# Patient Record
Sex: Female | Born: 1970 | Race: Black or African American | Hispanic: No | Marital: Single | Smoking: Never smoker
Health system: Southern US, Community
[De-identification: ages and names within clinical notes are randomized; demographics above are authoritative.]

## PROBLEM LIST (undated history)

## (undated) DIAGNOSIS — Z8619 Personal history of other infectious and parasitic diseases: Secondary | ICD-10-CM

## (undated) DIAGNOSIS — R51 Headache: Secondary | ICD-10-CM

## (undated) DIAGNOSIS — K219 Gastro-esophageal reflux disease without esophagitis: Secondary | ICD-10-CM

## (undated) DIAGNOSIS — I10 Essential (primary) hypertension: Secondary | ICD-10-CM

## (undated) DIAGNOSIS — G43909 Migraine, unspecified, not intractable, without status migrainosus: Secondary | ICD-10-CM

## (undated) DIAGNOSIS — N6452 Nipple discharge: Secondary | ICD-10-CM

## (undated) DIAGNOSIS — R519 Headache, unspecified: Secondary | ICD-10-CM

## (undated) HISTORY — PX: NO PAST SURGERIES: SHX2092

## (undated) HISTORY — DX: Headache, unspecified: R51.9

## (undated) HISTORY — PX: CERVICAL SPINE SURGERY: SHX589

## (undated) HISTORY — DX: Migraine, unspecified, not intractable, without status migrainosus: G43.909

## (undated) HISTORY — DX: Personal history of other infectious and parasitic diseases: Z86.19

## (undated) HISTORY — DX: Headache: R51

---

## 2013-08-21 ENCOUNTER — Encounter (HOSPITAL_BASED_OUTPATIENT_CLINIC_OR_DEPARTMENT_OTHER): Payer: Self-pay | Admitting: Emergency Medicine

## 2013-08-21 ENCOUNTER — Emergency Department (HOSPITAL_BASED_OUTPATIENT_CLINIC_OR_DEPARTMENT_OTHER)
Admission: EM | Admit: 2013-08-21 | Discharge: 2013-08-22 | Disposition: A | Discharge status: Home / Self Care | Payer: Self-pay | Attending: Emergency Medicine | Admitting: Emergency Medicine

## 2013-08-21 ENCOUNTER — Emergency Department (HOSPITAL_BASED_OUTPATIENT_CLINIC_OR_DEPARTMENT_OTHER): Payer: Self-pay

## 2013-08-21 DIAGNOSIS — S6390XA Sprain of unspecified part of unspecified wrist and hand, initial encounter: Secondary | ICD-10-CM | POA: Insufficient documentation

## 2013-08-21 DIAGNOSIS — S63609A Unspecified sprain of unspecified thumb, initial encounter: Secondary | ICD-10-CM

## 2013-08-21 DIAGNOSIS — Y92410 Unspecified street and highway as the place of occurrence of the external cause: Secondary | ICD-10-CM | POA: Insufficient documentation

## 2013-08-21 DIAGNOSIS — Y9389 Activity, other specified: Secondary | ICD-10-CM | POA: Insufficient documentation

## 2013-08-21 MED ORDER — NAPROXEN 250 MG PO TABS
500.0000 mg | ORAL_TABLET | Freq: Once | ORAL | Status: AC
  Administered 2013-08-21: 500 mg via ORAL
  Filled 2013-08-21: qty 2
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
## 2013-08-21 NOTE — ED Notes (Signed)
MD at bedside. 
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
## 2013-08-21 NOTE — ED Notes (Signed)
Patient transported to X-ray ambulatory with tech. 

## 2013-08-22 MED ORDER — HYDROCODONE-ACETAMINOPHEN 5-325 MG PO TABS: 1.0000 | ORAL_TABLET | Freq: Four times a day (QID) | ORAL | Status: DC | PRN

## 2013-08-22 NOTE — ED Notes (Signed)
MD at bedside to discuss results of testing. 

## 2013-12-14 ENCOUNTER — Emergency Department (HOSPITAL_BASED_OUTPATIENT_CLINIC_OR_DEPARTMENT_OTHER)
Admission: EM | Admit: 2013-12-14 | Discharge: 2013-12-14 | Disposition: A | Discharge status: Home / Self Care | Payer: No Typology Code available for payment source | Attending: Emergency Medicine | Admitting: Emergency Medicine

## 2013-12-14 ENCOUNTER — Encounter (HOSPITAL_BASED_OUTPATIENT_CLINIC_OR_DEPARTMENT_OTHER): Payer: Self-pay | Admitting: Emergency Medicine

## 2013-12-14 ENCOUNTER — Emergency Department (HOSPITAL_BASED_OUTPATIENT_CLINIC_OR_DEPARTMENT_OTHER): Payer: No Typology Code available for payment source

## 2013-12-14 DIAGNOSIS — R110 Nausea: Secondary | ICD-10-CM | POA: Insufficient documentation

## 2013-12-14 DIAGNOSIS — R519 Headache, unspecified: Secondary | ICD-10-CM

## 2013-12-14 DIAGNOSIS — R209 Unspecified disturbances of skin sensation: Secondary | ICD-10-CM | POA: Insufficient documentation

## 2013-12-14 DIAGNOSIS — R51 Headache: Secondary | ICD-10-CM | POA: Insufficient documentation

## 2013-12-14 DIAGNOSIS — Z3202 Encounter for pregnancy test, result negative: Secondary | ICD-10-CM | POA: Insufficient documentation

## 2013-12-14 DIAGNOSIS — R202 Paresthesia of skin: Secondary | ICD-10-CM

## 2013-12-14 DIAGNOSIS — R531 Weakness: Secondary | ICD-10-CM

## 2013-12-14 DIAGNOSIS — M6281 Muscle weakness (generalized): Secondary | ICD-10-CM | POA: Insufficient documentation

## 2013-12-14 LAB — CBC WITH DIFFERENTIAL/PLATELET
Basophils Absolute: 0 10*3/uL (ref 0.0–0.1)
Basophils Relative: 0 % (ref 0–1)
Eosinophils Absolute: 0.1 10*3/uL (ref 0.0–0.7)
Eosinophils Relative: 1 % (ref 0–5)
HEMATOCRIT: 40.5 % (ref 36.0–46.0)
Hemoglobin: 13.6 g/dL (ref 12.0–15.0)
LYMPHS PCT: 24 % (ref 12–46)
Lymphs Abs: 2.4 10*3/uL (ref 0.7–4.0)
MCH: 32.7 pg (ref 26.0–34.0)
MCHC: 33.6 g/dL (ref 30.0–36.0)
MCV: 97.4 fL (ref 78.0–100.0)
MONO ABS: 0.7 10*3/uL (ref 0.1–1.0)
Monocytes Relative: 7 % (ref 3–12)
Neutro Abs: 6.6 10*3/uL (ref 1.7–7.7)
Neutrophils Relative %: 68 % (ref 43–77)
Platelets: 307 10*3/uL (ref 150–400)
RBC: 4.16 MIL/uL (ref 3.87–5.11)
RDW: 12.2 % (ref 11.5–15.5)
WBC: 9.8 10*3/uL (ref 4.0–10.5)

## 2013-12-14 LAB — BASIC METABOLIC PANEL
BUN: 12 mg/dL (ref 6–23)
CO2: 26 meq/L (ref 19–32)
Calcium: 9.5 mg/dL (ref 8.4–10.5)
Chloride: 101 mEq/L (ref 96–112)
Creatinine, Ser: 0.9 mg/dL (ref 0.50–1.10)
GFR calc Af Amer: >90 mL/min (ref >90)
GFR calc non Af Amer: 78 mL/min — ABNORMAL LOW (ref >90)
GLUCOSE: 103 mg/dL — AB (ref 70–99)
Potassium: 3.9 mEq/L (ref 3.7–5.3)
Sodium: 139 mEq/L (ref 137–147)

## 2013-12-14 LAB — PREGNANCY, URINE: Preg Test, Ur: NEGATIVE

## 2013-12-14 MED ORDER — SODIUM CHLORIDE 0.9 % IV BOLUS (SEPSIS)
1000.0000 mL | Freq: Once | INTRAVENOUS | Status: AC
  Administered 2013-12-14: 1000 mL via INTRAVENOUS

## 2013-12-14 MED ORDER — IBUPROFEN 400 MG PO TABS
600.0000 mg | ORAL_TABLET | Freq: Once | ORAL | Status: AC
  Administered 2013-12-14: 600 mg via ORAL
  Filled 2013-12-14 (×2): qty 1

## 2013-12-14 NOTE — ED Provider Notes (Signed)
CSN: [id number]     Arrival date & time 12/14/13  1814 History  This chart was scribed for Shanna CiscoMegan E Docherty, MD by Beverly MilchJ Harrison Collins, ED Scribe. This patient was seen in room MH09/MH09 and the patient's care was started at 8:54 PM.     Chief Complaint  Patient presents with  . Numbness      Patient is a 43 y.o. female presenting with weakness. The history is provided by the patient. No language interpreter was used.  Weakness This is a recurrent problem. The current episode started more than 1 week ago (About a month ago). The problem occurs daily. The problem has been gradually worsening. Associated symptoms include headaches. Pertinent negatives include no chest pain, no abdominal pain and no shortness of breath. Associated symptoms comments: pt states she been having episodic numbness on the outside of her left arm and left leg lasting 2-3 hours. She also reports nausea, dizziness, and sweating.. She has tried nothing for the symptoms. The treatment provided no relief.  Pt states her most recent episode of numbness lasted 2-3 hours and ended this afternoon around 3 PM and her most recent headache began this afternoon around 4 PM. She states she currently has no numbness. She states the h/a's and numbness are not related and occur independently.     History reviewed. No pertinent past medical history. History reviewed. No pertinent past surgical history. No family history on file. History  Substance Use Topics  . Smoking status: Never Smoker   . Smokeless tobacco: Not on file  . Alcohol Use: No    OB History   Grav Para Term Preterm Abortions TAB SAB Ect Mult Living                  Review of Systems  Constitutional: Negative for fever, chills, diaphoresis, activity change, appetite change and fatigue.  HENT: Negative for congestion, facial swelling, rhinorrhea and sore throat.   Eyes: Negative for photophobia and discharge.  Respiratory: Negative for cough, chest tightness  and shortness of breath.   Cardiovascular: Negative for chest pain, palpitations and leg swelling.  Gastrointestinal: Positive for nausea. Negative for vomiting, abdominal pain and diarrhea.  Endocrine: Negative for polydipsia and polyuria.  Genitourinary: Negative for dysuria, frequency, difficulty urinating and pelvic pain.  Musculoskeletal: Negative for arthralgias, back pain, neck pain and neck stiffness.  Skin: Negative for color change and wound.  Allergic/Immunologic: Negative for immunocompromised state.  Neurological: Positive for dizziness, weakness and headaches. Negative for facial asymmetry and numbness.  Hematological: Does not bruise/bleed easily.  Psychiatric/Behavioral: Negative for confusion and agitation.      Allergies  Review of patient's allergies indicates no known allergies.  Home Medications   Current Outpatient Rx  Name  Route  Sig  Dispense  Refill  . HYDROcodone-acetaminophen (NORCO) 5-325 MG per tablet   Oral   Take 1 tablet by mouth every 6 (six) hours as needed.   10 tablet   0    Triage Vitals: BP 156/90  Pulse 71  Temp(Src) 98.4 F (36.9 C) (Oral)  Resp 18  Ht 5\' 7"  (1.702 m)  Wt 220 lb (99.791 kg)  BMI 34.45 kg/m2  SpO2 100%  LMP 12/10/2013  Physical Exam  Nursing note and vitals reviewed. Constitutional: She is oriented to person, place, and time. She appears well-developed and well-nourished. No distress.  HENT:  Head: Normocephalic.  Mouth/Throat: Oropharynx is clear and moist.  Eyes: Conjunctivae and EOM are normal. Pupils are equal, round,  and reactive to light.  Neck: Neck supple.  Cardiovascular: Normal rate, regular rhythm and normal heart sounds.  Exam reveals no gallop and no friction rub.   No murmur heard. Pulmonary/Chest: Effort normal and breath sounds normal. No respiratory distress. She has no wheezes. She has no rales.  Abdominal: Soft. Bowel sounds are normal. She exhibits no distension. There is no tenderness.  There is no rebound and no guarding.  Musculoskeletal: She exhibits no edema and no tenderness.  Neurological: She is alert and oriented to person, place, and time. She has normal strength. She displays no atrophy and no tremor. No cranial nerve deficit or sensory deficit. She exhibits normal muscle tone. She displays a negative Romberg sign. She displays no seizure activity. Coordination and gait normal. GCS eye subscore is 4. GCS verbal subscore is 5. GCS motor subscore is 6.  Skin: Skin is warm and dry.  Psychiatric: She has a normal mood and affect. Her behavior is normal.    ED Course  Procedures (including critical care time)  DIAGNOSTIC STUDIES: Oxygen Saturation is 100% on RA, normal by my interpretation.    COORDINATION OF CARE: 9:04 PM- Pt advised of plan for treatment and pt agrees.    Labs Review Labs Reviewed  BASIC METABOLIC PANEL - Abnormal; Notable for the following:    Glucose, Bld 103 (*)    GFR calc non Af Amer 78 (*)    All other components within normal limits  CBC WITH DIFFERENTIAL  PREGNANCY, URINE   Imaging Review Ct Head Wo Contrast  12/14/2013   CLINICAL DATA:  Headache.  Left side numbness and weakness.  EXAM: CT HEAD WITHOUT CONTRAST  TECHNIQUE: Contiguous axial images were obtained from the base of the skull through the vertex without intravenous contrast.  COMPARISON:  None.  FINDINGS: The brain appears normal without infarct, hemorrhage, mass lesion, mass effect, midline shift or abnormal extra-axial fluid collection no hydrocephalus or pneumocephalus. The calvarium is intact.  IMPRESSION: Negative head CT.   Electronically Signed   By: Drusilla Kanner M.D.   On: 12/14/2013 22:16     EKG Interpretation None      MDM   Final diagnoses:  Paresthesias  Weakness  Headache    Pt is a 43 y.o. female with Pmhx as above who presents with about 4 weeks of intermittent L lateral arm numbness and L lateral leg numbness that ws occuring about every  week or so, now every few days, lasting 2-3 hours at a time. No other assoc symptoms, though does report intermittent frontal h/a's that occur independently from numbness and has no assoc symptoms. On PE, VSS, pt in NAD.  No focal neuro findings on PE.  CBC, BMP grossly unremarkable. CT head unremarkable. Doubt CVA/TIA, mass lesion.  I feel she is safe for continued outpt f/u, will refer to Neurology.  Return precautions given for new or worsening symptoms including worsening numbness, weakness, fevers.    I personally performed the services described in this documentation, which was scribed in my presence. The recorded information has been reviewed and is accurate.      Shanna Cisco, MD 12/16/13 [phone]

## 2013-12-14 NOTE — ED Notes (Signed)
Numbness in her left leg and left arm x 2 days. Dizziness last night. She was sent home from work. Ambulatory to triage. Headache today.

## 2013-12-14 NOTE — Discharge Instructions (Signed)
Headaches, Frequently Asked Questions °MIGRAINE HEADACHES °Q: What is migraine? What causes it? How can I treat it? °A: Generally, migraine headaches begin as a dull ache. Then they develop into a constant, throbbing, and pulsating pain. You may experience pain at the temples. You may experience pain at the front or back of one or both sides of the head. The pain is usually accompanied by a combination of: °· Nausea. °· Vomiting. °· Sensitivity to light and noise. °Some people (about 15%) experience an aura (see below) before an attack. The cause of migraine is believed to be chemical reactions in the brain. Treatment for migraine may include over-the-counter or prescription medications. It may also include self-help techniques. These include relaxation training and biofeedback.  °Q: What is an aura? °A: About 15% of people with migraine get an "aura". This is a sign of neurological symptoms that occur before a migraine headache. You may see wavy or jagged lines, dots, or flashing lights. You might experience tunnel vision or blind spots in one or both eyes. The aura can include visual or auditory hallucinations (something imagined). It may include disruptions in smell (such as strange odors), taste or touch. Other symptoms include: °· Numbness. °· A "pins and needles" sensation. °· Difficulty in recalling or speaking the correct word. °These neurological events may last as long as 60 minutes. These symptoms will fade as the headache begins. °Q: What is a trigger? °A: Certain physical or environmental factors can lead to or "trigger" a migraine. These include: °· Foods. °· Hormonal changes. °· Weather. °· Stress. °It is important to remember that triggers are different for everyone. To help prevent migraine attacks, you need to figure out which triggers affect you. Keep a headache diary. This is a good way to track triggers. The diary will help you talk to your healthcare professional about your condition. °Q: Does  weather affect migraines? °A: Bright sunshine, hot, humid conditions, and drastic changes in barometric pressure may lead to, or "trigger," a migraine attack in some people. But studies have shown that weather does not act as a trigger for everyone with migraines. °Q: What is the link between migraine and hormones? °A: Hormones start and regulate many of your body's functions. Hormones keep your body in balance within a constantly changing environment. The levels of hormones in your body are unbalanced at times. Examples are during menstruation, pregnancy, or menopause. That can lead to a migraine attack. In fact, about three quarters of all women with migraine report that their attacks are related to the menstrual cycle.  °Q: Is there an increased risk of stroke for migraine sufferers? °A: The likelihood of a migraine attack causing a stroke is very remote. That is not to say that migraine sufferers cannot have a stroke associated with their migraines. In persons under age 40, the most common associated factor for stroke is migraine headache. But over the course of a person's normal life span, the occurrence of migraine headache may actually be associated with a reduced risk of dying from cerebrovascular disease due to stroke.  °Q: What are acute medications for migraine? °A: Acute medications are used to treat the pain of the headache after it has started. Examples over-the-counter medications, NSAIDs, ergots, and triptans.  °Q: What are the triptans? °A: Triptans are the newest class of abortive medications. They are specifically targeted to treat migraine. Triptans are vasoconstrictors. They moderate some chemical reactions in the brain. The triptans work on receptors in your brain. Triptans help   to restore the balance of a neurotransmitter called serotonin. Fluctuations in levels of serotonin are thought to be a main cause of migraine.  °Q: Are over-the-counter medications for migraine effective? °A:  Over-the-counter, or "OTC," medications may be effective in relieving mild to moderate pain and associated symptoms of migraine. But you should see your caregiver before beginning any treatment regimen for migraine.  °Q: What are preventive medications for migraine? °A: Preventive medications for migraine are sometimes referred to as "prophylactic" treatments. They are used to reduce the frequency, severity, and length of migraine attacks. Examples of preventive medications include antiepileptic medications, antidepressants, beta-blockers, calcium channel blockers, and NSAIDs (nonsteroidal anti-inflammatory drugs). °Q: Why are anticonvulsants used to treat migraine? °A: During the past few years, there has been an increased interest in antiepileptic drugs for the prevention of migraine. They are sometimes referred to as "anticonvulsants". Both epilepsy and migraine may be caused by similar reactions in the brain.  °Q: Why are antidepressants used to treat migraine? °A: Antidepressants are typically used to treat people with depression. They may reduce migraine frequency by regulating chemical levels, such as serotonin, in the brain.  °Q: What alternative therapies are used to treat migraine? °A: The term "alternative therapies" is often used to describe treatments considered outside the scope of conventional Western medicine. Examples of alternative therapy include acupuncture, acupressure, and yoga. Another common alternative treatment is herbal therapy. Some herbs are believed to relieve headache pain. Always discuss alternative therapies with your caregiver before proceeding. Some herbal products contain arsenic and other toxins. °TENSION HEADACHES °Q: What is a tension-type headache? What causes it? How can I treat it? °A: Tension-type headaches occur randomly. They are often the result of temporary stress, anxiety, fatigue, or anger. Symptoms include soreness in your temples, a tightening band-like sensation  around your head (a "vice-like" ache). Symptoms can also include a pulling feeling, pressure sensations, and contracting head and neck muscles. The headache begins in your forehead, temples, or the back of your head and neck. Treatment for tension-type headache may include over-the-counter or prescription medications. Treatment may also include self-help techniques such as relaxation training and biofeedback. °CLUSTER HEADACHES °Q: What is a cluster headache? What causes it? How can I treat it? °A: Cluster headache gets its name because the attacks come in groups. The pain arrives with little, if any, warning. It is usually on one side of the head. A tearing or bloodshot eye and a runny nose on the same side of the headache may also accompany the pain. Cluster headaches are believed to be caused by chemical reactions in the brain. They have been described as the most severe and intense of any headache type. Treatment for cluster headache includes prescription medication and oxygen. °SINUS HEADACHES °Q: What is a sinus headache? What causes it? How can I treat it? °A: When a cavity in the bones of the face and skull (a sinus) becomes inflamed, the inflammation will cause localized pain. This condition is usually the result of an allergic reaction, a tumor, or an infection. If your headache is caused by a sinus blockage, such as an infection, you will probably have a fever. An x-ray will confirm a sinus blockage. Your caregiver's treatment might include antibiotics for the infection, as well as antihistamines or decongestants.  °REBOUND HEADACHES °Q: What is a rebound headache? What causes it? How can I treat it? °A: A pattern of taking acute headache medications too often can lead to a condition known as "rebound headache."   A pattern of taking too much headache medication includes taking it more than 2 days per week or in excessive amounts. That means more than the label or a caregiver advises. With rebound  headaches, your medications not only stop relieving pain, they actually begin to cause headaches. Doctors treat rebound headache by tapering the medication that is being overused. Sometimes your caregiver will gradually substitute a different type of treatment or medication. Stopping may be a challenge. Regularly overusing a medication increases the potential for serious side effects. Consult a caregiver if you regularly use headache medications more than 2 days per week or more than the label advises. °ADDITIONAL QUESTIONS AND ANSWERS °Q: What is biofeedback? °A: Biofeedback is a self-help treatment. Biofeedback uses special equipment to monitor your body's involuntary physical responses. Biofeedback monitors: °· Breathing. °· Pulse. °· Heart rate. °· Temperature. °· Muscle tension. °· Brain activity. °Biofeedback helps you refine and perfect your relaxation exercises. You learn to control the physical responses that are related to stress. Once the technique has been mastered, you do not need the equipment any more. °Q: Are headaches hereditary? °A: Four out of five (80%) of people that suffer report a family history of migraine. Scientists are not sure if this is genetic or a family predisposition. Despite the uncertainty, a child has a 50% chance of having migraine if one parent suffers. The child has a 75% chance if both parents suffer.  °Q: Can children get headaches? °A: By the time they reach high school, most young people have experienced some type of headache. Many safe and effective approaches or medications can prevent a headache from occurring or stop it after it has begun.  °Q: What type of doctor should I see to diagnose and treat my headache? °A: Start with your primary caregiver. Discuss his or her experience and approach to headaches. Discuss methods of classification, diagnosis, and treatment. Your caregiver may decide to recommend you to a headache specialist, depending upon your symptoms or other  physical conditions. Having diabetes, allergies, etc., may require a more comprehensive and inclusive approach to your headache. The [employer] will provide, upon request, a list of NHF physician members in your state. °Document Released: 12/08/2003 Document Revised: 12/10/2011 Document Reviewed: 05/17/2008 °Exit[employer]® Patient Information ©2014 [employer]. ° °Paresthesia °Paresthesia is an abnormal burning or prickling sensation. This sensation is generally felt in the hands, arms, legs, or feet. However, it may occur in any part of the body. It is usually not painful. The feeling may be described as: °· Tingling or numbness. °· "Pins and needles." °· Skin crawling. °· Buzzing. °· Limbs "falling asleep." °· Itching. °Most people experience temporary (transient) paresthesia at some time in their lives. °CAUSES  °Paresthesia may occur when you breathe too quickly (hyperventilation). It can also occur without any apparent cause. Commonly, paresthesia occurs when pressure is placed on a nerve. The feeling quickly goes away once the pressure is removed. For some people, however, paresthesia is a long-lasting (chronic) condition caused by an underlying disorder. The underlying disorder may be: °· A traumatic, direct injury to nerves. Examples include a: °· Broken (fractured) neck. °· Fractured skull. °· A disorder affecting the brain and spinal cord (central nervous system). Examples include: °· Transverse myelitis. °· Encephalitis. °· Transient ischemic attack. °· Multiple sclerosis. °· Stroke. °· Tumor or blood vessel problems, such as an arteriovenous malformation pressing against the brain or spinal cord. °· A condition that damages the peripheral nerves (peripheral neuropathy). Peripheral nerves   not part of the brain and spinal cord. These conditions include:  Diabetes.  Peripheral vascular disease.  Nerve entrapment syndromes, such as carpal tunnel  syndrome.  Shingles.  Hypothyroidism.  Vitamin B12 deficiencies.  Alcoholism.  Heavy metal poisoning (lead, arsenic).  Rheumatoid arthritis.  Systemic lupus erythematosus. DIAGNOSIS  Your caregiver will attempt to find the underlying cause of your paresthesia. Your caregiver may:  Take your medical history.  Perform a physical exam.  Order various lab tests.  Order imaging tests. TREATMENT  Treatment for paresthesia depends on the underlying cause. HOME CARE INSTRUCTIONS  Avoid drinking alcohol.  You may consider massage or acupuncture to help relieve your symptoms.  Keep all follow-up appointments as directed by your caregiver. SEEK IMMEDIATE MEDICAL CARE IF:   You feel weak.  You have trouble walking or moving.  You have problems with speech or vision.  You feel confused.  You cannot control your bladder or bowel movements.  You feel numbness after an injury.  You faint.  Your burning or prickling feeling gets worse when walking.  You have pain, cramps, or dizziness.  You develop a rash. MAKE SURE YOU:  Understand these instructions.  Will watch your condition.  Will get help right away if you are not doing well or get worse. Document Released: 09/07/2002 Document Revised: 12/10/2011 Document Reviewed: 06/08/2011 [facility] Patient Information 2014 KennedyExitCare, MarylandLL[employer].

## 2013-12-14 NOTE — ED Notes (Signed)
Numbness in left arm and leg x 2 days  w vomiting last pm,  Headache x 4.5 hours, no n/v since last pm,  Ambulatory without diff,  No facial droop,  No arm drift,  Grips equal and strong bilateral

## 2014-05-10 ENCOUNTER — Emergency Department (HOSPITAL_BASED_OUTPATIENT_CLINIC_OR_DEPARTMENT_OTHER)
Admission: EM | Admit: 2014-05-10 | Discharge: 2014-05-10 | Disposition: A | Discharge status: Home / Self Care | Payer: BC Managed Care – PPO | Attending: Emergency Medicine | Admitting: Emergency Medicine

## 2014-05-10 ENCOUNTER — Encounter (HOSPITAL_BASED_OUTPATIENT_CLINIC_OR_DEPARTMENT_OTHER): Payer: Self-pay | Admitting: Emergency Medicine

## 2014-05-10 DIAGNOSIS — R112 Nausea with vomiting, unspecified: Secondary | ICD-10-CM | POA: Insufficient documentation

## 2014-05-10 DIAGNOSIS — Z8719 Personal history of other diseases of the digestive system: Secondary | ICD-10-CM | POA: Diagnosis not present

## 2014-05-10 DIAGNOSIS — R1013 Epigastric pain: Secondary | ICD-10-CM | POA: Diagnosis not present

## 2014-05-10 DIAGNOSIS — R61 Generalized hyperhidrosis: Secondary | ICD-10-CM | POA: Insufficient documentation

## 2014-05-10 DIAGNOSIS — R6883 Chills (without fever): Secondary | ICD-10-CM | POA: Diagnosis not present

## 2014-05-10 DIAGNOSIS — R1110 Vomiting, unspecified: Secondary | ICD-10-CM | POA: Insufficient documentation

## 2014-05-10 DIAGNOSIS — R1033 Periumbilical pain: Secondary | ICD-10-CM | POA: Insufficient documentation

## 2014-05-10 HISTORY — DX: Gastro-esophageal reflux disease without esophagitis: K21.9

## 2014-05-10 LAB — URINE MICROSCOPIC-ADD ON

## 2014-05-10 LAB — CBC WITH DIFFERENTIAL/PLATELET
BASOS ABS: 0 10*3/uL (ref 0.0–0.1)
BASOS PCT: 0 % (ref 0–1)
EOS PCT: 1 % (ref 0–5)
Eosinophils Absolute: 0.1 10*3/uL (ref 0.0–0.7)
HCT: 39.1 % (ref 36.0–46.0)
Hemoglobin: 13.1 g/dL (ref 12.0–15.0)
LYMPHS PCT: 20 % (ref 12–46)
Lymphs Abs: 2.2 10*3/uL (ref 0.7–4.0)
MCH: 32.3 pg (ref 26.0–34.0)
MCHC: 33.5 g/dL (ref 30.0–36.0)
MCV: 96.5 fL (ref 78.0–100.0)
Monocytes Absolute: 1.1 10*3/uL — ABNORMAL HIGH (ref 0.1–1.0)
Monocytes Relative: 10 % (ref 3–12)
Neutro Abs: 7.4 10*3/uL (ref 1.7–7.7)
Neutrophils Relative %: 68 % (ref 43–77)
PLATELETS: 322 10*3/uL (ref 150–400)
RBC: 4.05 MIL/uL (ref 3.87–5.11)
RDW: 12.4 % (ref 11.5–15.5)
WBC: 10.9 10*3/uL — AB (ref 4.0–10.5)

## 2014-05-10 LAB — COMPREHENSIVE METABOLIC PANEL
ALBUMIN: 3.7 g/dL (ref 3.5–5.2)
ALT: 21 U/L (ref 0–35)
AST: 27 U/L (ref 0–37)
Alkaline Phosphatase: 89 U/L (ref 39–117)
Anion gap: 12 (ref 5–15)
BUN: 12 mg/dL (ref 6–23)
CALCIUM: 9.7 mg/dL (ref 8.4–10.5)
CO2: 25 meq/L (ref 19–32)
Chloride: 103 mEq/L (ref 96–112)
Creatinine, Ser: 0.9 mg/dL (ref 0.50–1.10)
GFR calc Af Amer: >90 mL/min (ref >90)
GFR, EST NON AFRICAN AMERICAN: 78 mL/min — AB (ref >90)
Glucose, Bld: 93 mg/dL (ref 70–99)
Potassium: 4 mEq/L (ref 3.7–5.3)
SODIUM: 140 meq/L (ref 137–147)
Total Bilirubin: 0.5 mg/dL (ref 0.3–1.2)
Total Protein: 7.5 g/dL (ref 6.0–8.3)

## 2014-05-10 LAB — URINALYSIS, ROUTINE W REFLEX MICROSCOPIC
Glucose, UA: NEGATIVE mg/dL
KETONES UR: 15 mg/dL — AB
Leukocytes, UA: NEGATIVE
Nitrite: NEGATIVE
PH: 5 (ref 5.0–8.0)
Protein, ur: 30 mg/dL — AB
Specific Gravity, Urine: 1.037 — ABNORMAL HIGH (ref 1.005–1.030)
Urobilinogen, UA: 1 mg/dL (ref 0.0–1.0)

## 2014-05-10 LAB — LIPASE, BLOOD: Lipase: 22 U/L (ref 11–59)

## 2014-05-10 LAB — PREGNANCY, URINE: Preg Test, Ur: NEGATIVE

## 2014-05-10 MED ORDER — ONDANSETRON HCL 4 MG/2ML IJ SOLN
4.0000 mg | Freq: Once | INTRAMUSCULAR | Status: AC
  Administered 2014-05-10: 4 mg via INTRAVENOUS
  Filled 2014-05-10: qty 2

## 2014-05-10 MED ORDER — ONDANSETRON HCL 4 MG PO TABS: 4.0000 mg | ORAL_TABLET | Freq: Four times a day (QID) | ORAL | Status: DC

## 2014-05-10 MED ORDER — GI COCKTAIL ~~LOC~~
30.0000 mL | Freq: Once | ORAL | Status: AC
  Administered 2014-05-10: 30 mL via ORAL
  Filled 2014-05-10: qty 30

## 2014-05-10 MED ORDER — PANTOPRAZOLE SODIUM 20 MG PO TBEC
20.0000 mg | DELAYED_RELEASE_TABLET | Freq: Every day | ORAL | Status: DC
Start: 2014-05-10 — End: 2015-02-08

## 2014-05-10 NOTE — Discharge Instructions (Signed)

## 2014-05-10 NOTE — ED Notes (Signed)
Pt c/o n/v with diffuse abd pain x 1 day

## 2014-05-10 NOTE — ED Notes (Signed)
Placed on cardiac and pulse ox monitoring.

## 2014-05-10 NOTE — ED Provider Notes (Signed)
CSN: [id number]     Arrival date & time 05/10/14  1815 History  This chart was scribed for Kim Macdonald, * by Modena Jansky, ED Scribe. This patient was seen in room MH05/MH05 and the patient's care was started at 7:42 PM.   Chief Complaint  Patient presents with  . Emesis   Patient is a 43 y.o. female presenting with vomiting. The history is provided by the patient. No language interpreter was used.  Emesis Severity:  Moderate Duration:  14 hours Timing:  Intermittent Progression:  Unchanged Chronicity:  New Relieved by:  None tried Worsened by:  Nothing tried Ineffective treatments:  None tried Associated symptoms: abdominal pain and chills    HPI Comments: Kim Macdonald is a 43 y.o. female with a hx of reflux who presents to the Emergency Department complaining of moderate intermittent emesis that started yesterday evening. She reports that has associated right sided abdominal pain and nausea. She states that her nausea, not her abdominal pain, is exacerbated by eating. She states that she has diaphoresis and some chills when she vomits. She reports that she has normal BMs. She states that her LNMP was 5 days ago. She denies any fever, flank pain, urinary symptoms, vaginal bleeding, or vaginal discharge.    Past Medical History  Diagnosis Date  . GERD (gastroesophageal reflux disease)    History reviewed. No pertinent past surgical history. History reviewed. No pertinent family history. History  Substance Use Topics  . Smoking status: Never Smoker   . Smokeless tobacco: Not on file  . Alcohol Use: No   OB History   Grav Para Term Preterm Abortions TAB SAB Ect Mult Living                 Review of Systems  Constitutional: Positive for chills and diaphoresis. Negative for fever.  Gastrointestinal: Positive for nausea, vomiting and abdominal pain.  Genitourinary: Negative for dysuria, flank pain, vaginal bleeding and vaginal discharge.  All other systems reviewed and  are negative.   Allergies  Review of patient's allergies indicates no known allergies.  Home Medications   Prior to Admission medications   Not on File   BP 149/88  Pulse 84  Temp(Src) 98 F (36.7 C) (Oral)  Resp 16  Ht 5\' 7"  (1.702 m)  Wt 220 lb (99.791 kg)  BMI 34.45 kg/m2  SpO2 99%  LMP 05/05/2014 Physical Exam  Nursing note and vitals reviewed. Constitutional: She is oriented to person, place, and time. She appears well-developed and well-nourished. No distress.  HENT:  Head: Normocephalic and atraumatic.  Mouth/Throat: Oropharynx is clear and moist.  Eyes: Conjunctivae are normal. Pupils are equal, round, and reactive to light. No scleral icterus.  Neck: Neck supple.  Cardiovascular: Normal rate, regular rhythm, normal heart sounds and intact distal pulses.   No murmur heard. Pulmonary/Chest: Effort normal and breath sounds normal. No stridor. No respiratory distress. She has no rales.  Abdominal: Soft. Bowel sounds are normal. She exhibits no distension. There is tenderness in the epigastric area and periumbilical area. There is no rigidity, no rebound, no guarding and no CVA tenderness.  Musculoskeletal: Normal range of motion.  Neurological: She is alert and oriented to person, place, and time.  Skin: Skin is warm and dry. No rash noted.  Psychiatric: She has a normal mood and affect. Her behavior is normal.    ED Course  Procedures (including critical care time) DIAGNOSTIC STUDIES: Oxygen Saturation is 99% on RA, normal by my interpretation.  COORDINATION OF CARE: 7:46 PM- Pt advised of plan for treatment which includes medication and labs and pt agrees.  Labs Review Labs Reviewed  URINALYSIS, ROUTINE W REFLEX MICROSCOPIC - Abnormal; Notable for the following:    Color, Urine AMBER (*)    APPearance CLOUDY (*)    Specific Gravity, Urine 1.037 (*)    Hgb urine dipstick LARGE (*)    Bilirubin Urine SMALL (*)    Ketones, ur 15 (*)    Protein, ur 30 (*)     All other components within normal limits  URINE MICROSCOPIC-ADD ON - Abnormal; Notable for the following:    Squamous Epithelial / LPF FEW (*)    All other components within normal limits  CBC WITH DIFFERENTIAL - Abnormal; Notable for the following:    WBC 10.9 (*)    Monocytes Absolute 1.1 (*)    All other components within normal limits  PREGNANCY, URINE  COMPREHENSIVE METABOLIC PANEL  LIPASE, BLOOD    Imaging Review No results found.   EKG Interpretation None      MDM   Final diagnoses:  Epigastric abdominal pain    43 yo female with nausea, vomiting, and epigastric/periumbilical pain.  She has hx of GERD, but is off all PPIs.  She has no RLQ, lower abdominal, or RUQ TTP.  Likely GERD.  Lipase negative.  She has blood in urine, but no CVA or flank pain or tenderness.  Low suspicion for kidney stone.  Plan to restart PPI.  I don't think she needs any imaging at this time.  Given return precautions.    I personally performed the services described in this documentation, which was scribed in my presence. The recorded information has been reviewed and is accurate.      Merrie RoofJohn David Dymin Dingledine III, MD 05/10/14 [phone]

## 2014-11-28 ENCOUNTER — Encounter (HOSPITAL_BASED_OUTPATIENT_CLINIC_OR_DEPARTMENT_OTHER): Payer: Self-pay

## 2014-11-28 ENCOUNTER — Emergency Department (HOSPITAL_BASED_OUTPATIENT_CLINIC_OR_DEPARTMENT_OTHER): Payer: BLUE CROSS/BLUE SHIELD

## 2014-11-28 ENCOUNTER — Emergency Department (HOSPITAL_BASED_OUTPATIENT_CLINIC_OR_DEPARTMENT_OTHER)
Admission: EM | Admit: 2014-11-28 | Discharge: 2014-11-28 | Disposition: A | Discharge status: Home / Self Care | Payer: BLUE CROSS/BLUE SHIELD | Attending: Emergency Medicine | Admitting: Emergency Medicine

## 2014-11-28 DIAGNOSIS — R51 Headache: Secondary | ICD-10-CM | POA: Diagnosis not present

## 2014-11-28 DIAGNOSIS — Z79899 Other long term (current) drug therapy: Secondary | ICD-10-CM | POA: Diagnosis not present

## 2014-11-28 DIAGNOSIS — K219 Gastro-esophageal reflux disease without esophagitis: Secondary | ICD-10-CM | POA: Insufficient documentation

## 2014-11-28 DIAGNOSIS — R519 Headache, unspecified: Secondary | ICD-10-CM

## 2014-11-28 DIAGNOSIS — Z3202 Encounter for pregnancy test, result negative: Secondary | ICD-10-CM | POA: Insufficient documentation

## 2014-11-28 DIAGNOSIS — Z7982 Long term (current) use of aspirin: Secondary | ICD-10-CM | POA: Insufficient documentation

## 2014-11-28 LAB — BASIC METABOLIC PANEL
Anion gap: 2 — ABNORMAL LOW (ref 5–15)
BUN: 9 mg/dL (ref 6–23)
CALCIUM: 8.3 mg/dL — AB (ref 8.4–10.5)
CHLORIDE: 109 mmol/L (ref 96–112)
CO2: 23 mmol/L (ref 19–32)
CREATININE: 0.77 mg/dL (ref 0.50–1.10)
GFR calc Af Amer: >90 mL/min (ref >90)
GFR calc non Af Amer: >90 mL/min (ref >90)
Glucose, Bld: 100 mg/dL — ABNORMAL HIGH (ref 70–99)
Potassium: 3.7 mmol/L (ref 3.5–5.1)
Sodium: 134 mmol/L — ABNORMAL LOW (ref 135–145)

## 2014-11-28 LAB — CBC WITH DIFFERENTIAL/PLATELET
BASOS ABS: 0 10*3/uL (ref 0.0–0.1)
Basophils Relative: 0 % (ref 0–1)
Eosinophils Absolute: 0.1 10*3/uL (ref 0.0–0.7)
Eosinophils Relative: 1 % (ref 0–5)
HEMATOCRIT: 40.5 % (ref 36.0–46.0)
Hemoglobin: 13.3 g/dL (ref 12.0–15.0)
Lymphocytes Relative: 24 % (ref 12–46)
Lymphs Abs: 2.5 10*3/uL (ref 0.7–4.0)
MCH: 31.3 pg (ref 26.0–34.0)
MCHC: 32.8 g/dL (ref 30.0–36.0)
MCV: 95.3 fL (ref 78.0–100.0)
Monocytes Absolute: 1 10*3/uL (ref 0.1–1.0)
Monocytes Relative: 9 % (ref 3–12)
Neutro Abs: 6.8 10*3/uL (ref 1.7–7.7)
Neutrophils Relative %: 66 % (ref 43–77)
Platelets: 278 10*3/uL (ref 150–400)
RBC: 4.25 MIL/uL (ref 3.87–5.11)
RDW: 12.8 % (ref 11.5–15.5)
WBC: 10.3 10*3/uL (ref 4.0–10.5)

## 2014-11-28 LAB — PREGNANCY, URINE: Preg Test, Ur: NEGATIVE

## 2014-11-28 MED ORDER — BUTALBITAL-APAP-CAFFEINE 50-325-40 MG PO TABS
1.0000 | ORAL_TABLET | Freq: Four times a day (QID) | ORAL | Status: DC | PRN
  Filled 2014-11-28: qty 1

## 2014-11-28 MED ORDER — DEXAMETHASONE SODIUM PHOSPHATE 10 MG/ML IJ SOLN
10.0000 mg | Freq: Once | INTRAMUSCULAR | Status: AC
  Administered 2014-11-28: 10 mg via INTRAVENOUS
  Filled 2014-11-28: qty 1

## 2014-11-28 MED ORDER — DIPHENHYDRAMINE HCL 50 MG/ML IJ SOLN
25.0000 mg | Freq: Once | INTRAMUSCULAR | Status: AC
Start: 2014-11-28 — End: 2014-11-28
  Administered 2014-11-28: 25 mg via INTRAVENOUS
  Filled 2014-11-28: qty 1

## 2014-11-28 MED ORDER — SODIUM CHLORIDE 0.9 % IV BOLUS (SEPSIS)
1000.0000 mL | Freq: Once | INTRAVENOUS | Status: AC
  Administered 2014-11-28: 1000 mL via INTRAVENOUS

## 2014-11-28 MED ORDER — BUTALBITAL-APAP-CAFFEINE 50-325-40 MG PO TABS: 1.0000 | ORAL_TABLET | Freq: Four times a day (QID) | ORAL | Status: DC | PRN

## 2014-11-28 MED ORDER — ASPIRIN 81 MG PO CHEW: 81.0000 mg | CHEWABLE_TABLET | Freq: Every day | ORAL | Status: DC

## 2014-11-28 MED ORDER — METOCLOPRAMIDE HCL 5 MG/ML IJ SOLN
10.0000 mg | Freq: Once | INTRAMUSCULAR | Status: AC
  Administered 2014-11-28: 10 mg via INTRAVENOUS
  Filled 2014-11-28: qty 2

## 2014-11-28 MED ORDER — ASPIRIN 81 MG PO CHEW
324.0000 mg | CHEWABLE_TABLET | Freq: Once | ORAL | Status: AC
  Administered 2014-11-28: 324 mg via ORAL
  Filled 2014-11-28: qty 4

## 2014-11-28 MED ORDER — IOHEXOL 350 MG/ML SOLN
100.0000 mL | Freq: Once | INTRAVENOUS | Status: AC | PRN
  Administered 2014-11-28: 100 mL via INTRAVENOUS

## 2014-11-28 NOTE — Discharge Instructions (Signed)
It is very important that you follow with your primary care doctor first thing in the morning tomorrow. Start taking a daily aspirin as we have discussed. Do not hesitate to return to the emergency room for any new, worsening or concerning symptoms.

## 2014-11-28 NOTE — ED Notes (Signed)
Patient here with headache, x 2 days and complains of BP being up.

## 2014-11-28 NOTE — ED Provider Notes (Signed)
CSN: [id number]     Arrival date & time 11/28/14  1309 History   First MD Initiated Contact with Patient 11/28/14 1334     Chief Complaint  Patient presents with  . Headache     (Consider location/radiation/quality/duration/timing/severity/associated sxs/prior Treatment) HPI   Rowynn Mcweeney is a 44 y.o. female complaining of Headache onset 2 days ago, 2 out of 10 at onset increasing to 5 out of 10 over the course of one hour. Described as pins and needles in the bilateral frontal and occipital area .Patient states she's been taking Aleve at home with no relief. She does not typically have headaches. She states that she feels her left arm and leg are weak x2 months, she feels like when she walks she veers to the left since the onset of headache x2 days. She has associated nausea,cervicalgia and left shoulder pain. Patient denies fever, chills, change in vision, dysarthria, chest pain, shortness of breath, exacerbation by exertion, Valsalva, exacerbation the morning, syncope. States that initially at the headache onset her sister took her blood pressure and it was 163/105. Patient follows at [facility], she doesn't take any medication regularly and has no known past medical history aside from GERD.   Past Medical History  Diagnosis Date  . GERD (gastroesophageal reflux disease)    History reviewed. No pertinent past surgical history. No family history on file. History  Substance Use Topics  . Smoking status: Never Smoker   . Smokeless tobacco: Not on file  . Alcohol Use: No   OB History    No data available     Review of Systems  10 systems reviewed and found to be negative, except as noted in the HPI.   Allergies  Review of patient's allergies indicates no known allergies.  Home Medications   Prior to Admission medications   Medication Sig Start Date End Date Taking? Authorizing Provider  aspirin 81 MG chewable tablet Chew 1 tablet (81 mg total) by mouth daily.  11/28/14   Vienna Ileigh Mettler, PA-C  butalbital-acetaminophen-caffeine (FIORICET) [phone] MG per tablet Take 1 tablet by mouth every 6 (six) hours as needed for headache. 11/28/14   Joni Reining Vernetta Dizdarevic, PA-C  pantoprazole (PROTONIX) 20 MG tablet Take 1 tablet (20 mg total) by mouth daily. 05/10/14   [name], MD   BP 154/82 mmHg  Pulse 76  Temp(Src) 98.9 F (37.2 C) (Oral)  Resp 18  Ht  (1.702 m)  Wt 230 lb 12.8 oz (104.69 kg)  BMI 36.14 kg/m2  SpO2 98% Physical Exam  Constitutional: She is oriented to person, place, and time. She appears well-developed and well-nourished. No distress.  HENT:  Head: Normocephalic and atraumatic.  Mouth/Throat: Oropharynx is clear and moist.  Eyes: Conjunctivae and EOM are normal. Pupils are equal, round, and reactive to light.  Neck: Normal range of motion. Neck supple.  FROM to C-spine. Pt can touch chin to chest without discomfort. No TTP of midline cervical spine.   Cardiovascular: Normal rate, regular rhythm and intact distal pulses.   Pulmonary/Chest: Effort normal and breath sounds normal. No stridor. No respiratory distress. She has no wheezes. She has no rales. She exhibits no tenderness.  Abdominal: Soft. There is no tenderness.  Musculoskeletal: Normal range of motion. She exhibits no edema or tenderness.  Neurological: She is alert and oriented to person, place, and time.  II-Visual fields grossly intact. III/IV/VI-Extraocular movements intact.  Pupils reactive bilaterally. V/VII-Smile symmetric, equal eyebrow raise,  facial sensation intact VIII-  Hearing grossly intact IX/X-Normal gag XI-bilateral shoulder shrug XII-midline tongue extension Motor: 5/5 bilaterally with normal tone and bulk Cerebellar: Normal finger-to-nose  and normal heel-to-shin test.   Romberg negative Ambulates with a coordinated gait,  Can ambulate on tip toes woi   Psychiatric: She has a normal mood and affect.  Nursing note and vitals  reviewed.   ED Course  Procedures (including critical care time) Labs Review Labs Reviewed  BASIC METABOLIC PANEL - Abnormal; Notable for the following:    Sodium 134 (*)    Glucose, Bld 100 (*)    Calcium 8.3 (*)    Anion gap 2 (*)    All other components within normal limits  PREGNANCY, URINE  CBC WITH DIFFERENTIAL/PLATELET    Imaging Review Ct Angio Head W/cm &/or Wo Cm  11/28/2014   CLINICAL DATA:  Headache for 3 days.  Left arm and leg weakness.  EXAM: CT ANGIOGRAPHY HEAD  TECHNIQUE: Multidetector CT imaging of the head was performed using the standard protocol during bolus administration of intravenous contrast. Multiplanar CT image reconstructions and MIPs were obtained to evaluate the vascular anatomy.  CONTRAST:  100mL OMNIPAQUE IOHEXOL 350 MG/ML SOLN  COMPARISON:  CT head without contrast 12/14/2013  FINDINGS: CT HEAD  Brain: No acute infarct, hemorrhage, or mass lesion is present. The basal ganglia and insular ribbon is intact. The source images demonstrate no focal infarct.  Calvarium and skull base: Negative.  Paranasal sinuses: Clear  Orbits: Unremarkable.  CTA HEAD  Anterior circulation: The internal carotid arteries are within normal limits from the high cervical segments through the ICA termini bilaterally. The A1 and M1 segments are normal. Anterior communicating artery is patent. MCA bifurcations are intact. The ACA and MCA branch vessels are within normal limits.  Posterior circulation: The right vertebral artery is the dominant vessel. The PICA origins are not visualized. Dominant AICA vessels are seen bilaterally. The right posterior cerebral artery is of fetal type. The left posterior cerebral artery originates from the basilar tip. A small left posterior communicating artery is present as well. The PCA branch vessels are within normal limits.  Venous sinuses: The dural sinuses are patent.  Anatomic variants: Fetal type right posterior cerebral artery.  Delayed phase:The  postcontrast images demonstrate no pathologic enhancement.  IMPRESSION: 1. Normal variant circle of Willis without evidence for significant proximal stenosis, aneurysm, or branch vessel occlusion. 2. Normal CT appearance of the brain parenchyma foreign after contrast. No acute infarct is evident.   Electronically Signed   By: Marin Robertshristopher  Mattern M.D.   On: 11/28/2014 16:15     EKG Interpretation None      MDM   Final diagnoses:  Acute nonintractable headache, unspecified headache type    Filed Vitals:   11/28/14 1322 11/28/14 1606 11/28/14 1802 11/28/14 1820  BP: 158/85 140/88  154/82  Pulse: 75 77  76  Temp: 98.5 F (36.9 C)  98.9 F (37.2 C)   TempSrc: Oral     Resp: 18 14  18   Height: 5\' 7"  (1.702 m)     Weight: 230 lb 12.8 oz (104.69 kg)     SpO2: 98% 99%  98%    Medications  metoCLOPramide (REGLAN) injection 10 mg (10 mg Intravenous Given 11/28/14 1424)  sodium chloride 0.9 % bolus 1,000 mL (0 mLs Intravenous Stopped 11/28/14 1713)  dexamethasone (DECADRON) injection 10 mg (10 mg Intravenous Given 11/28/14 1424)  diphenhydrAMINE (BENADRYL) injection 25 mg (25 mg Intravenous Given 11/28/14 1424)  iohexol (OMNIPAQUE) 350  MG/ML injection 100 mL (100 mLs Intravenous Contrast Given 11/28/14 1553)  aspirin chewable tablet 324 mg (324 mg Oral Given 11/28/14 1804)    Kamariya Blevens is a pleasant 44 y.o. female presenting with Atypical headache with associated subjective weakness and ataxia, cervicalgia. On my exam there are no focal signs neurologically. This was not thunderclap onset or exacerbated by exertion or Valsalva. I'm concerned about subarachnoid versus CVA. This patient is a larger woman and I think that a lumbar puncture would be technically difficult. I will obtain a CT angiogram after discussing with attending physician. She will likely need an MRI as well. Will discuss case with neurology and likely transfer to [facility] for [facility].  Case discussed with neurologist Dr.  Thad Ranger, states that the patient had a small posterior stroke it would not show up on the CTA, thinks that this is likely a subacute infarct if any, would recommend one of 2 choices either transferring her to [facility] to obtain an MRI or discharged home with close follow-up with her primary care physician. I discussed these options with the patient and she has chosen to go home. She will follow closely with her primary care doctor tomorrow morning. She will be started on a low-dose daily aspirin. We have had an extensive discussion of return precautions.  Evaluation does not show pathology that would require ongoing emergent intervention or inpatient treatment. Pt is hemodynamically stable and mentating appropriately. Discussed findings and plan with patient/guardian, who agrees with care plan. All questions answered. Return precautions discussed and outpatient follow up given.   Discharge Medication List as of 11/28/2014  5:59 PM    START taking these medications   Details  aspirin 81 MG chewable tablet Chew 1 tablet (81 mg total) by mouth daily., Starting 11/28/2014, Until Discontinued, Print    butalbital-acetaminophen-caffeine (FIORICET) 50-325-40 MG per tablet Take 1 tablet by mouth every 6 (six) hours as needed for headache., Starting 11/28/2014, Until Discontinued, Print             Chalon Zobrist, PA-C 11/29/14 1655  Rolan Bucco, MD 11/30/14 1505

## 2014-12-01 ENCOUNTER — Encounter: Payer: Self-pay | Admitting: Physician Assistant

## 2014-12-01 ENCOUNTER — Ambulatory Visit (INDEPENDENT_AMBULATORY_CARE_PROVIDER_SITE_OTHER): Payer: BLUE CROSS/BLUE SHIELD | Admitting: Physician Assistant

## 2014-12-01 VITALS — BP 143/88 | HR 111 | Temp 100.1°F | Resp 16 | Ht 67.0 in | Wt 232.0 lb

## 2014-12-01 DIAGNOSIS — R51 Headache: Secondary | ICD-10-CM

## 2014-12-01 DIAGNOSIS — R6889 Other general symptoms and signs: Secondary | ICD-10-CM | POA: Insufficient documentation

## 2014-12-01 DIAGNOSIS — R519 Headache, unspecified: Secondary | ICD-10-CM | POA: Insufficient documentation

## 2014-12-01 DIAGNOSIS — R202 Paresthesia of skin: Secondary | ICD-10-CM

## 2014-12-01 LAB — CBC
HEMATOCRIT: 41.1 % (ref 36.0–46.0)
HEMOGLOBIN: 14.1 g/dL (ref 12.0–15.0)
MCHC: 34.5 g/dL (ref 30.0–36.0)
MCV: 92.1 fl (ref 78.0–100.0)
PLATELETS: 280 10*3/uL (ref 150.0–400.0)
RBC: 4.46 Mil/uL (ref 3.87–5.11)
RDW: 14 % (ref 11.5–15.5)
WBC: 13.9 10*3/uL — ABNORMAL HIGH (ref 4.0–10.5)

## 2014-12-01 LAB — LIPID PANEL
CHOL/HDL RATIO: 3
CHOLESTEROL: 150 mg/dL (ref 0–200)
HDL: 43.8 mg/dL (ref >39.00)
LDL Cholesterol: 92 mg/dL (ref 0–99)
NonHDL: 106.2
TRIGLYCERIDES: 71 mg/dL (ref 0.0–149.0)
VLDL: 14.2 mg/dL (ref 0.0–40.0)

## 2014-12-01 LAB — BASIC METABOLIC PANEL
BUN: 8 mg/dL (ref 6–23)
CO2: 26 mEq/L (ref 19–32)
Calcium: 9.4 mg/dL (ref 8.4–10.5)
Chloride: 101 mEq/L (ref 96–112)
Creatinine, Ser: 1.03 mg/dL (ref 0.40–1.20)
GFR: 75.14 mL/min (ref >60.00)
Glucose, Bld: 108 mg/dL — ABNORMAL HIGH (ref 70–99)
POTASSIUM: 3.8 meq/L (ref 3.5–5.1)
SODIUM: 133 meq/L — AB (ref 135–145)

## 2014-12-01 LAB — POCT INFLUENZA A/B
INFLUENZA A, POC: NEGATIVE
INFLUENZA B, POC: NEGATIVE

## 2014-12-01 LAB — VITAMIN B12: VITAMIN B 12: 561 pg/mL (ref 211–911)

## 2014-12-01 MED ORDER — OSELTAMIVIR PHOSPHATE 75 MG PO CAPS: 75.0000 mg | ORAL_CAPSULE | Freq: Two times a day (BID) | ORAL | Status: DC

## 2014-12-01 MED ORDER — BENZONATATE 200 MG PO CAPS: 200.0000 mg | ORAL_CAPSULE | Freq: Three times a day (TID) | ORAL | Status: DC | PRN

## 2014-12-01 NOTE — Patient Instructions (Signed)
Please stay well hydrated and get plenty of rest. Take the Tamiflu as directed for your flu symptoms.  This will help shorten the duration and severity of the flu.  Use the Tessalon as directed for cough.  Place a humidifier in the bedroom.  You neuro exam looks good today, but giving your symptoms, we need to proceed with an MRI.  You will be contacted to schedule.  Please go to the lab for blood work.  I will call you with your results.  Use Excedrin as directed for headache.    If you develop severe headache, confusion, dizziness, facial drooping or weakness, please call 911.

## 2014-12-01 NOTE — Progress Notes (Signed)
Patient presents to clinic today to establish care.  Endorses headaches occuring daily associated with paresthesias of arms and legs bilaterally.  Endorses symptoms have been present x 2 months.  Was recently in the ER on 11/28/14 with acute worsening of symptoms.  Subacute stroke was suspected and Neurology recommended STAT MRI, but patient refused.  Patient was stable at time and headache resolved, so they felt it was appropriate to send her home. At present, patient endorses headache is resolved.  Notes intermittent numbness and tingling of bilateral upper and lower extremities.  Denies gait disturbance.  Patient endorses abrupt onset of severe rhinorrhea, cough, ST and fever x 2 days.  Endorses sick contact.  Has not had the flu shot this year.  Endorses body aches and chills.  Endorses daughter and niece with the flu currently.  Past Medical History  Diagnosis Date  . GERD (gastroesophageal reflux disease)   . HA (headache)   . History of chicken pox   . Migraine     Current Outpatient Prescriptions on File Prior to Visit  Medication Sig Dispense Refill  . aspirin 81 MG chewable tablet Chew 1 tablet (81 mg total) by mouth daily. 30 tablet 0  . butalbital-acetaminophen-caffeine (FIORICET) 50-325-40 MG per tablet Take 1 tablet by mouth every 6 (six) hours as needed for headache. 15 tablet 0  . pantoprazole (PROTONIX) 20 MG tablet Take 1 tablet (20 mg total) by mouth daily. 30 tablet 2   No current facility-administered medications on file prior to visit.    No Known Allergies  Family History  Problem Relation Age of Onset  . Congestive Heart Failure Mother 15    Deceased  . Stroke Mother   . Stroke Brother     multiple [6]  . Hypertension Brother     x3  . Hypertension Sister     x4  . Diabetes Sister   . Diabetes Brother   . Diabetes Mother   . Diabetes Maternal Grandmother   . Breast cancer Maternal Aunt   . Heart attack Maternal Uncle   . Healthy Son     x1  .  Healthy Daughter     x2    History   Social History  . Marital Status: Single    Spouse Name: N/A  . Number of Children: N/A  . Years of Education: N/A   Social History Main Topics  . Smoking status: Never Smoker   . Smokeless tobacco: Not on file  . Alcohol Use: No  . Drug Use: No  . Sexual Activity: No   Other Topics Concern  . None   Social History Narrative   Review of Systems - See HPI.  All other ROS are negative.  BP 143/88 mmHg  Pulse 111  Temp(Src) 100.1 F (37.8 C) (Oral)  Resp 16  Ht 5\' 7"  (1.702 m)  Wt 232 lb (105.235 kg)  BMI 36.33 kg/m2  SpO2 98%  LMP 11/16/2014  Physical Exam  Constitutional: She is oriented to person, place, and time and well-developed, well-nourished, and in no distress.  HENT:  Head: Normocephalic and atraumatic.  Right Ear: Tympanic membrane, external ear and ear canal normal.  Left Ear: Tympanic membrane, external ear and ear canal normal.  Mouth/Throat: Uvula is midline, oropharynx is clear and moist and mucous membranes are normal.  Eyes: Conjunctivae are normal.  Cardiovascular: Normal rate, regular rhythm, normal heart sounds and intact distal pulses.   Pulmonary/Chest: Effort normal and breath sounds normal. No respiratory distress.  She has no wheezes. She has no rales. She exhibits no tenderness.  Neurological: She is alert and oriented to person, place, and time. She has normal strength, normal reflexes and intact cranial nerves. She displays no weakness, facial symmetry and normal stance. Coordination and gait normal.  Skin: Skin is warm and dry. No rash noted.  Psychiatric: Affect normal.  Vitals reviewed.   Recent Results (from the past 2160 hour(s))  Pregnancy, urine     Status: None   Collection Time: 11/28/14  1:09 PM  Result Value Ref Range   Preg Test, Ur NEGATIVE NEGATIVE  CBC with Differential     Status: None   Collection Time: 11/28/14  2:00 PM  Result Value Ref Range   WBC 10.3 4.0 - 10.5 K/uL   RBC  4.25 3.87 - 5.11 MIL/uL   Hemoglobin 13.3 12.0 - 15.0 g/dL   HCT 40.5 36.0 - 46.0 %   MCV 95.3 78.0 - 100.0 fL   MCH 31.3 26.0 - 34.0 pg   MCHC 32.8 30.0 - 36.0 g/dL   RDW 12.8 11.5 - 15.5 %   Platelets 278 150 - 400 K/uL   Neutrophils Relative % 66 43 - 77 %   Neutro Abs 6.8 1.7 - 7.7 K/uL   Lymphocytes Relative 24 12 - 46 %   Lymphs Abs 2.5 0.7 - 4.0 K/uL   Monocytes Relative 9 3 - 12 %   Monocytes Absolute 1.0 0.1 - 1.0 K/uL   Eosinophils Relative 1 0 - 5 %   Eosinophils Absolute 0.1 0.0 - 0.7 K/uL   Basophils Relative 0 0 - 1 %   Basophils Absolute 0.0 0.0 - 0.1 K/uL  Basic metabolic panel     Status: Abnormal   Collection Time: 11/28/14  2:00 PM  Result Value Ref Range   Sodium 134 (L) 135 - 145 mmol/L   Potassium 3.7 3.5 - 5.1 mmol/L   Chloride 109 96 - 112 mmol/L   CO2 23 19 - 32 mmol/L   Glucose, Bld 100 (H) 70 - 99 mg/dL   BUN 9 6 - 23 mg/dL   Creatinine, Ser 0.77 0.50 - 1.10 mg/dL   Calcium 8.3 (L) 8.4 - 10.5 mg/dL   GFR calc non Af Amer >90 >90 mL/min   GFR calc Af Amer >90 >90 mL/min    Comment: (NOTE) The eGFR has been calculated using the CKD EPI equation. This calculation has not been validated in all clinical situations. eGFR's persistently <90 mL/min signify possible Chronic Kidney Disease.    Anion gap 2 (L) 5 - 15  POCT Influenza A/B     Status: Normal   Collection Time: 12/01/14  8:25 AM  Result Value Ref Range   Influenza A, POC Negative    Influenza B, POC Negative   CBC     Status: Abnormal   Collection Time: 12/01/14  8:43 AM  Result Value Ref Range   WBC 13.9 (H) 4.0 - 10.5 K/uL   RBC 4.46 3.87 - 5.11 Mil/uL   Platelets 280.0 150.0 - 400.0 K/uL   Hemoglobin 14.1 12.0 - 15.0 g/dL   HCT 41.1 36.0 - 46.0 %   MCV 92.1 78.0 - 100.0 fl   MCHC 34.5 30.0 - 36.0 g/dL   RDW 14.0 11.5 - 00.9 %  Basic Metabolic Panel (BMET)     Status: Abnormal   Collection Time: 12/01/14  8:43 AM  Result Value Ref Range   Sodium 133 (L) 135 - 145 mEq/L  Potassium 3.8 3.5 - 5.1 mEq/L   Chloride 101 96 - 112 mEq/L   CO2 26 19 - 32 mEq/L   Glucose, Bld 108 (H) 70 - 99 mg/dL   BUN 8 6 - 23 mg/dL   Creatinine, Ser 1.03 0.40 - 1.20 mg/dL   Calcium 9.4 8.4 - 10.5 mg/dL   GFR 75.14 >60.00 mL/min  B12     Status: None   Collection Time: 12/01/14  8:43 AM  Result Value Ref Range   Vitamin B-12 561 211 - 911 pg/mL  Lipid panel     Status: None   Collection Time: 12/01/14  8:43 AM  Result Value Ref Range   Cholesterol 150 0 - 200 mg/dL    Comment: ATP III Classification       Desirable:  < 200 mg/dL               Borderline High:  200 - 239 mg/dL          High:  > = 240 mg/dL   Triglycerides 71.0 0.0 - 149.0 mg/dL    Comment: Normal:  <150 mg/dLBorderline High:  150 - 199 mg/dL   HDL 43.80 >39.00 mg/dL   VLDL 14.2 0.0 - 40.0 mg/dL   LDL Cholesterol 92 0 - 99 mg/dL   Total CHOL/HDL Ratio 3     Comment:                Men          Women1/2 Average Risk     3.4          3.3Average Risk          5.0          4.42X Average Risk          9.6          7.13X Average Risk          15.0          11.0                       NonHDL 106.20     Comment: NOTE:  Non-HDL goal should be 30 mg/dL higher than patient's LDL goal (i.e. LDL goal of < 70 mg/dL, would have non-HDL goal of < 100 mg/dL)    Assessment/Plan: Flu-like symptoms Flu swab negative.  Symptoms and history fit flu illness.  Rx Tamiflu.  Tessalon per orders for cough.  Rest and hydration are key.  Supportive measures discussed.   Paresthesias Neuro exam normal today.  Giving headaches will proceed with the MRI that was originally recommended by Neuro at her ER visit.  Alarm signs/symptoms discussed.  Will obtain lab workup today.  If all is normal, recommend Neurologist evaluation.

## 2014-12-01 NOTE — Progress Notes (Signed)
Pre visit review using our clinic review tool, if applicable. No additional management support is needed unless otherwise documented below in the visit note/SLS  

## 2014-12-02 NOTE — Assessment & Plan Note (Signed)
Flu swab negative.  Symptoms and history fit flu illness.  Rx Tamiflu.  Tessalon per orders for cough.  Rest and hydration are key.  Supportive measures discussed.

## 2014-12-02 NOTE — Assessment & Plan Note (Signed)
Neuro exam normal today.  Giving headaches will proceed with the MRI that was originally recommended by Neuro at her ER visit.  Alarm signs/symptoms discussed.  Will obtain lab workup today.  If all is normal, recommend Neurologist evaluation.

## 2014-12-04 ENCOUNTER — Ambulatory Visit (HOSPITAL_BASED_OUTPATIENT_CLINIC_OR_DEPARTMENT_OTHER)
Admission: RE | Admit: 2014-12-04 | Discharge: 2014-12-04 | Disposition: A | Discharge status: Home / Self Care | Payer: BLUE CROSS/BLUE SHIELD | Source: Ambulatory Visit | Attending: Physician Assistant | Admitting: Physician Assistant

## 2014-12-04 DIAGNOSIS — R519 Headache, unspecified: Secondary | ICD-10-CM

## 2014-12-04 DIAGNOSIS — R202 Paresthesia of skin: Secondary | ICD-10-CM | POA: Insufficient documentation

## 2014-12-04 DIAGNOSIS — R51 Headache: Secondary | ICD-10-CM | POA: Insufficient documentation

## 2014-12-04 DIAGNOSIS — R42 Dizziness and giddiness: Secondary | ICD-10-CM | POA: Diagnosis not present

## 2014-12-04 MED ORDER — GADOBENATE DIMEGLUMINE 529 MG/ML IV SOLN: 20.0000 mL | Freq: Once | INTRAVENOUS | Status: AC | PRN

## 2014-12-06 ENCOUNTER — Telehealth: Payer: Self-pay | Admitting: Physician Assistant

## 2014-12-06 DIAGNOSIS — R519 Headache, unspecified: Secondary | ICD-10-CM

## 2014-12-06 DIAGNOSIS — R930 Abnormal findings on diagnostic imaging of skull and head, not elsewhere classified: Secondary | ICD-10-CM

## 2014-12-06 DIAGNOSIS — R51 Headache: Principal | ICD-10-CM

## 2014-12-06 NOTE — Telephone Encounter (Signed)
Called and spoke with the pt and informed her of the MRI results and note.  Pt verbalized understanding.  Pt agreed to the Neurology referral.  Pt stated that she has a FMLA that needs to be completed.  Informed the pt that we can fill out the part that pertains to her visit here.  Pt agreed and said she will been the form.//AB/CMA    MRI shows no acute abnormality. There are some more chronic abnormal findings that correlate to her symptoms. She needs further workup with Neurology. I would like to place the referral if she is willing. I think this is absolutely necessary.

## 2014-12-06 NOTE — Telephone Encounter (Signed)
Caller name: Marnesha Relation to pt: self Call back number: [phone] Pharmacy:  Reason for call:   Requesting MRI results  Requesting that cody calls her with results

## 2014-12-06 NOTE — Telephone Encounter (Signed)
Referral placed.  Will complete FMLA paperwork once received.

## 2014-12-07 ENCOUNTER — Telehealth: Payer: Self-pay | Admitting: Physician Assistant

## 2014-12-07 NOTE — Telephone Encounter (Signed)
Paperwork has not been received

## 2014-12-07 NOTE — Telephone Encounter (Signed)
FMLA paperwork received.  °

## 2014-12-07 NOTE — Telephone Encounter (Signed)
Caller name: Saundra ShellingWall, Kati Relation to pt: self  Call back number: [phone]   Reason for call:  Pt checking on the status of FMLA paperwork. Advised pt the turn around may be 5-7 business days.

## 2014-12-08 ENCOUNTER — Telehealth: Payer: Self-pay | Admitting: *Deleted

## 2014-12-08 NOTE — Telephone Encounter (Signed)
Opened in error

## 2014-12-08 NOTE — Telephone Encounter (Signed)
Forms filled out as much as possible and forwarded to Cody. JG//CMA  

## 2014-12-10 DIAGNOSIS — Z7689 Persons encountering health services in other specified circumstances: Secondary | ICD-10-CM

## 2014-12-10 NOTE — Telephone Encounter (Signed)
Patient states that this needs to be completed by today.   Patient requesting MRI results. She states that she is still having the tingling sensation. Best # [phone]

## 2014-12-10 NOTE — Telephone Encounter (Signed)
Spoke with pt to let her know that FMLA paperwork has been faxed. JG//CMA

## 2014-12-10 NOTE — Telephone Encounter (Signed)
Result Notes     Notes Recorded by Lurline HareKaren E Carter, LPN on 1/6/10963/05/2015 at 8:21 AM Called patient with results of MRI. States she was called yesterday with appointment to Neurology. ------  Notes Recorded by Waldon MerlWilliam C Martin, PA-C on 12/06/2014 at 7:19 AM MRI shows no acute abnormality. There are some more chronic abnormal findings that correlate to her symptoms. She needs further workup with Neurology. I would like to place the referral if she is willing. I think this is absolutely necessary.     LMOM with contact name and number for return call RE: FMLA & MRI; Saintclair HalstedJessica G was talking to patient as I was leaving message/SLS

## 2014-12-10 NOTE — Telephone Encounter (Signed)
Patient returned phone call. °

## 2014-12-13 ENCOUNTER — Telehealth: Payer: Self-pay | Admitting: Physician Assistant

## 2014-12-13 NOTE — Telephone Encounter (Signed)
Caller name: Tamicka Relation to pt: self Call back number: [phone] Pharmacy:  Reason for call:   Patient needs a note to return to work. Patient returned to work last Tuesday, march 8. Patient will pick up the letter

## 2014-12-13 NOTE — Telephone Encounter (Signed)
Patient informed, understood & agreed to have her daughter p/u letter/SLS

## 2014-12-14 NOTE — Telephone Encounter (Signed)
[facility] dates be changed to include 11/29/14 - 11/30/2014. I spoke with Selena Battenody and he stated dates will not be changed. [facility]ed [facility] this. JG//CMA

## 2014-12-31 ENCOUNTER — Ambulatory Visit: Payer: BLUE CROSS/BLUE SHIELD | Admitting: Physician Assistant

## 2014-12-31 ENCOUNTER — Ambulatory Visit (INDEPENDENT_AMBULATORY_CARE_PROVIDER_SITE_OTHER): Payer: BLUE CROSS/BLUE SHIELD | Admitting: Physician Assistant

## 2014-12-31 ENCOUNTER — Encounter: Payer: Self-pay | Admitting: Physician Assistant

## 2014-12-31 VITALS — BP 141/78 | HR 76 | Temp 98.4°F | Resp 16 | Ht 67.0 in | Wt 227.1 lb

## 2014-12-31 DIAGNOSIS — R202 Paresthesia of skin: Secondary | ICD-10-CM | POA: Diagnosis not present

## 2014-12-31 DIAGNOSIS — R51 Headache: Secondary | ICD-10-CM | POA: Diagnosis not present

## 2014-12-31 DIAGNOSIS — R519 Headache, unspecified: Secondary | ICD-10-CM

## 2014-12-31 MED ORDER — AMITRIPTYLINE HCL 10 MG PO TABS: 10.0000 mg | ORAL_TABLET | Freq: Every day | ORAL | Status: DC

## 2014-12-31 MED ORDER — HYDROCODONE-ACETAMINOPHEN 10-325 MG PO TABS: 1.0000 | ORAL_TABLET | Freq: Three times a day (TID) | ORAL | Status: DC | PRN

## 2014-12-31 NOTE — Telephone Encounter (Signed)
Additional FMLA [facility] at [phone]. Sent for scanning. JG//CMA

## 2014-12-31 NOTE — Patient Instructions (Signed)
Please start taking the Elavil as directed. This should help with sleep, pain and hopefully headaches. Use the Norco if needed for breakthrough headache pain. Stay well hydrated. You will be contacted to schedule an ultrasound of your carotid arteries. Also, please be sure to go to your neurology appointment.

## 2015-01-06 ENCOUNTER — Other Ambulatory Visit (HOSPITAL_BASED_OUTPATIENT_CLINIC_OR_DEPARTMENT_OTHER): Payer: BLUE CROSS/BLUE SHIELD

## 2015-01-06 ENCOUNTER — Ambulatory Visit (HOSPITAL_BASED_OUTPATIENT_CLINIC_OR_DEPARTMENT_OTHER): Payer: BLUE CROSS/BLUE SHIELD

## 2015-01-09 NOTE — Assessment & Plan Note (Signed)
W/ insomnia and anxiety.  Will begin Elavil 10 mg nightly. Will check US carotids. Follow-up 3-4 weeks.

## 2015-01-09 NOTE — Progress Notes (Signed)
Patient presents to clinic today for follow-up of paresthesias and left-sided weakness. Recent MRI negative for acute abnormality but abnormal chronic changes noted.  Patient with upcoming appointment with Neurology.  Endorses still having intermittent left-sided headaches.  Is having difficulty sleeping. Is taking 81 mg ASA daily as directed at last visit.  Past Medical History  Diagnosis Date  . GERD (gastroesophageal reflux disease)   . HA (headache)   . History of chicken pox   . Migraine     Current Outpatient Prescriptions on File Prior to Visit  Medication Sig Dispense Refill  . aspirin 81 MG chewable tablet Chew 1 tablet (81 mg total) by mouth daily. 30 tablet 0  . pantoprazole (PROTONIX) 20 MG tablet Take 1 tablet (20 mg total) by mouth daily. 30 tablet 2   No current facility-administered medications on file prior to visit.    No Known Allergies  Family History  Problem Relation Age of Onset  . Congestive Heart Failure Mother 19    Deceased  . Stroke Mother   . Stroke Brother     multiple [6]  . Hypertension Brother     x3  . Hypertension Sister     x4  . Diabetes Sister   . Diabetes Brother   . Diabetes Mother   . Diabetes Maternal Grandmother   . Breast cancer Maternal Aunt   . Heart attack Maternal Uncle   . Healthy Son     x1  . Healthy Daughter     x2    History   Social History  . Marital Status: Single    Spouse Name: N/A  . Number of Children: N/A  . Years of Education: N/A   Social History Main Topics  . Smoking status: Never Smoker   . Smokeless tobacco: Not on file  . Alcohol Use: No  . Drug Use: No  . Sexual Activity: No   Other Topics Concern  . None   Social History Narrative    Review of Systems - See HPI.  All other ROS are negative.  BP 141/78 mmHg  Pulse 76  Temp(Src) 98.4 F (36.9 C) (Oral)  Resp 16  Ht 5\' 7"  (1.702 m)  Wt 227 lb 2 oz (103.023 kg)  BMI 35.56 kg/m2  SpO2 100%  LMP 12/15/2014  Physical Exam    Constitutional: She is oriented to person, place, and time and well-developed, well-nourished, and in no distress.  HENT:  Head: Normocephalic and atraumatic.  Eyes: Conjunctivae are normal.  Neck: Normal range of motion. Neck supple. Normal carotid pulses present. Carotid bruit is not present.  Cardiovascular: Normal rate, regular rhythm, normal heart sounds and intact distal pulses.   Pulmonary/Chest: Effort normal and breath sounds normal. No respiratory distress. She has no wheezes. She has no rales. She exhibits no tenderness.  Neurological: She is alert and oriented to person, place, and time. No cranial nerve deficit.  Skin: Skin is warm and dry. No rash noted.  Psychiatric: Affect normal.  Vitals reviewed.   Recent Results (from the past 2160 hour(s))  Pregnancy, urine     Status: None   Collection Time: 11/28/14  1:09 PM  Result Value Ref Range   Preg Test, Ur NEGATIVE NEGATIVE  CBC with Differential     Status: None   Collection Time: 11/28/14  2:00 PM  Result Value Ref Range   WBC 10.3 4.0 - 10.5 K/uL   RBC 4.25 3.87 - 5.11 MIL/uL   Hemoglobin 13.3 12.0 -  15.0 g/dL   HCT 40.5 36.0 - 46.0 %   MCV 95.3 78.0 - 100.0 fL   MCH 31.3 26.0 - 34.0 pg   MCHC 32.8 30.0 - 36.0 g/dL   RDW 12.8 11.5 - 15.5 %   Platelets 278 150 - 400 K/uL   Neutrophils Relative % 66 43 - 77 %   Neutro Abs 6.8 1.7 - 7.7 K/uL   Lymphocytes Relative 24 12 - 46 %   Lymphs Abs 2.5 0.7 - 4.0 K/uL   Monocytes Relative 9 3 - 12 %   Monocytes Absolute 1.0 0.1 - 1.0 K/uL   Eosinophils Relative 1 0 - 5 %   Eosinophils Absolute 0.1 0.0 - 0.7 K/uL   Basophils Relative 0 0 - 1 %   Basophils Absolute 0.0 0.0 - 0.1 K/uL  Basic metabolic panel     Status: Abnormal   Collection Time: 11/28/14  2:00 PM  Result Value Ref Range   Sodium 134 (L) 135 - 145 mmol/L   Potassium 3.7 3.5 - 5.1 mmol/L   Chloride 109 96 - 112 mmol/L   CO2 23 19 - 32 mmol/L   Glucose, Bld 100 (H) 70 - 99 mg/dL   BUN 9 6 - 23 mg/dL    Creatinine, Ser 0.77 0.50 - 1.10 mg/dL   Calcium 8.3 (L) 8.4 - 10.5 mg/dL   GFR calc non Af Amer >90 >90 mL/min   GFR calc Af Amer >90 >90 mL/min    Comment: (NOTE) The eGFR has been calculated using the CKD EPI equation. This calculation has not been validated in all clinical situations. eGFR's persistently <90 mL/min signify possible Chronic Kidney Disease.    Anion gap 2 (L) 5 - 15  POCT Influenza A/B     Status: Normal   Collection Time: 12/01/14  8:25 AM  Result Value Ref Range   Influenza A, POC Negative    Influenza B, POC Negative   CBC     Status: Abnormal   Collection Time: 12/01/14  8:43 AM  Result Value Ref Range   WBC 13.9 (H) 4.0 - 10.5 K/uL   RBC 4.46 3.87 - 5.11 Mil/uL   Platelets 280.0 150.0 - 400.0 K/uL   Hemoglobin 14.1 12.0 - 15.0 g/dL   HCT 41.1 36.0 - 46.0 %   MCV 92.1 78.0 - 100.0 fl   MCHC 34.5 30.0 - 36.0 g/dL   RDW 14.0 11.5 - 59.5 %  Basic Metabolic Panel (BMET)     Status: Abnormal   Collection Time: 12/01/14  8:43 AM  Result Value Ref Range   Sodium 133 (L) 135 - 145 mEq/L   Potassium 3.8 3.5 - 5.1 mEq/L   Chloride 101 96 - 112 mEq/L   CO2 26 19 - 32 mEq/L   Glucose, Bld 108 (H) 70 - 99 mg/dL   BUN 8 6 - 23 mg/dL   Creatinine, Ser 1.03 0.40 - 1.20 mg/dL   Calcium 9.4 8.4 - 10.5 mg/dL   GFR 75.14 >60.00 mL/min  B12     Status: None   Collection Time: 12/01/14  8:43 AM  Result Value Ref Range   Vitamin B-12 561 211 - 911 pg/mL  Lipid panel     Status: None   Collection Time: 12/01/14  8:43 AM  Result Value Ref Range   Cholesterol 150 0 - 200 mg/dL    Comment: ATP III Classification       Desirable:  < 200 mg/dL  Borderline High:  200 - 239 mg/dL          High:  > = 240 mg/dL   Triglycerides 71.0 0.0 - 149.0 mg/dL    Comment: Normal:  <150 mg/dLBorderline High:  150 - 199 mg/dL   HDL 43.80 >39.00 mg/dL   VLDL 14.2 0.0 - 40.0 mg/dL   LDL Cholesterol 92 0 - 99 mg/dL   Total CHOL/HDL Ratio 3     Comment:                Men           Women1/2 Average Risk     3.4          3.3Average Risk          5.0          4.42X Average Risk          9.6          7.13X Average Risk          15.0          11.0                       NonHDL 106.20     Comment: NOTE:  Non-HDL goal should be 30 mg/dL higher than patient's LDL goal (i.e. LDL goal of < 70 mg/dL, would have non-HDL goal of < 100 mg/dL)    Assessment/Plan: Paresthesias Unclear etiology. Unchanged from last visit. Exam unremarkable. Encouraged patient to keep upcoming appointment with Neurology.  FMLA filled out. Alarm signs/symptoms discussed with patient.   Frequent headaches W/ insomnia and anxiety.  Will begin Elavil 10 mg nightly. Will check US carotids. Follow-up 3-4 weeks.

## 2015-01-09 NOTE — Assessment & Plan Note (Signed)
Unclear etiology. Unchanged from last visit. Exam unremarkable. Encouraged patient to keep upcoming appointment with Neurology.  FMLA filled out. Alarm signs/symptoms discussed with patient.

## 2015-01-10 ENCOUNTER — Ambulatory Visit (HOSPITAL_BASED_OUTPATIENT_CLINIC_OR_DEPARTMENT_OTHER)
Admission: RE | Admit: 2015-01-10 | Discharge: 2015-01-10 | Disposition: A | Discharge status: Home / Self Care | Payer: BLUE CROSS/BLUE SHIELD | Source: Ambulatory Visit | Attending: Physician Assistant | Admitting: Physician Assistant

## 2015-01-10 DIAGNOSIS — R51 Headache: Secondary | ICD-10-CM

## 2015-01-10 DIAGNOSIS — R200 Anesthesia of skin: Secondary | ICD-10-CM | POA: Diagnosis not present

## 2015-01-10 DIAGNOSIS — R519 Headache, unspecified: Secondary | ICD-10-CM

## 2015-01-14 ENCOUNTER — Ambulatory Visit: Payer: BLUE CROSS/BLUE SHIELD | Admitting: Physician Assistant

## 2015-01-17 ENCOUNTER — Encounter: Payer: Self-pay | Admitting: Neurology

## 2015-01-17 ENCOUNTER — Ambulatory Visit (INDEPENDENT_AMBULATORY_CARE_PROVIDER_SITE_OTHER): Payer: BLUE CROSS/BLUE SHIELD | Admitting: Neurology

## 2015-01-17 VITALS — BP 112/88 | HR 76 | Resp 20 | Ht 67.0 in | Wt 232.3 lb

## 2015-01-17 DIAGNOSIS — G43409 Hemiplegic migraine, not intractable, without status migrainosus: Secondary | ICD-10-CM | POA: Diagnosis not present

## 2015-01-17 DIAGNOSIS — R202 Paresthesia of skin: Secondary | ICD-10-CM | POA: Diagnosis not present

## 2015-01-17 DIAGNOSIS — R930 Abnormal findings on diagnostic imaging of skull and head, not elsewhere classified: Secondary | ICD-10-CM | POA: Diagnosis not present

## 2015-01-17 DIAGNOSIS — G43109 Migraine with aura, not intractable, without status migrainosus: Secondary | ICD-10-CM

## 2015-01-17 DIAGNOSIS — R200 Anesthesia of skin: Secondary | ICD-10-CM

## 2015-01-17 DIAGNOSIS — R9082 White matter disease, unspecified: Secondary | ICD-10-CM

## 2015-01-17 LAB — SEDIMENTATION RATE: Sed Rate: 12 mm/hr (ref 0–20)

## 2015-01-17 LAB — C-REACTIVE PROTEIN: CRP: 1.7 mg/dL — ABNORMAL HIGH (ref <0.60)

## 2015-01-17 NOTE — Patient Instructions (Addendum)
The MRI of the brain is not very impressive and non-specific for anything.  It may be of uncertain clinical significance.  The headaches sound like migraines and the left sided numbness is likely related to migraine but we need to do further workup.  I really don't think they are "mini-strokes".   1.  We will check some blood work:  Hypercoagulable panel and vasculitis panel 2.  We will [facility] MRI of brain with and without contrast [facility] 4:45[facility] 3.  Start taking the amitriptyline 10mg  at bedtime daily.  Give it 4 weeks and call with update in 4 weeks.  At that point, we can increase dose if needed. 4.  Stop the butalbital.  Just take Excedrin for the headaches.  Limit to no more than 2 days out of the week 5.  Follow up in 3 months.

## 2015-01-17 NOTE — Progress Notes (Signed)
NEUROLOGY CONSULTATION NOTE  Kim Macdonald MRN: [id number] DOB: June 10, 1971  Referring provider: Marcelline Mates, PA-C Primary care provider: Marcelline Mates, PA-C  Reason for consult:  Headache, left sided numbness, abnormal MRI of brain  HISTORY OF PRESENT ILLNESS: Kim Macdonald is a 44 year old right-handed woman with no significant past medical history who presents for headache and abnormal brain MRI.  Records, labs and MRI of brain, CTA of head and prior CT of head reviewed.  On 11/28/14, she woke up with new onset of headache, located in left side of head (temporal and face) and sometimes right temple, of a throbbing or pounding quality with neck pain and 8-9/10 intensity.  They last several hours and occur every two days.  They are associated with nausea but not photophobia, phonophobia or visual disturbance.  On that first morning, she got out of bed and felt that she was veering towards the left.  She also had numbness and tingling involving the left side of her face, left arm and left leg.  There was associated discomfort involving the left side.  She didn't feel well at work.  She checked her blood pressure, which was reportedly 163/105 so she went to the ED.  In the ED, her blood pressure was 154/82.  There was a concern for stroke.  A CTA of the head was performed, which was unremarkable.  An MRI of the brain was recommended, but the patient refused.  She was advised to start ASA .  She followed up with her PCP and an MRI of the brain with and without contrast was performed on 12/04/14, which showed scattered nonspecific punctate hyperintense foci in the cerebral white matter bilaterally, which were non-enhancing.  She later had a carotid doppler on 01/10/15, which showed no hemodynamically significant stenosis.  Since January, she has been having episodes of left sided numbness and tingling involving the left side of her face, arm and leg.  Her limbs feel heavy and she will drag her leg  when she has an episode.  It lasts several hours.  It also occurs about every other day, but occurs separately from the headaches (except for the first day of the headache).  As per the chart, she reportedly has a history of recurrent left sided numbness and weakness.  She presented to the ED for this, along with headache, on 12/14/13, where a CT of the head was performed and was unremarkable.  Interestingly, when I asked her about this, she says she doesn't really remember.  Her PCP started her on amitriptyline  daily but she hasn't been taking it due to concerns of sleepiness and that she may not being able to function at work.  She has been taking Fioricet or Excedrin approximately 8 days per month.  Labs from last month include mildly elevated WBC of 13.9, B12 561, and lipid panel with cholesterol 150, HDL 43.80 and LDL 92.  She has no prior history of headache.  As for her family history, her siblings have headaches.  Her mother had a stroke in her 68s.  Her brother has a history of several TIAs in his 74s.  There is no family history of aneurysms.   PAST MEDICAL HISTORY: Past Medical History  Diagnosis Date  . GERD (gastroesophageal reflux disease)   . HA (headache)   . History of chicken pox   . Migraine     PAST SURGICAL HISTORY: Past Surgical History  Procedure Laterality Date  . No past surgeries  MEDICATIONS: Current Outpatient Prescriptions on File Prior to Visit  Medication Sig Dispense Refill  . amitriptyline (ELAVIL) 10 MG tablet Take 1 tablet (10 mg total) by mouth at bedtime. 30 tablet 1  . aspirin 81 MG chewable tablet Chew 1 tablet (81 mg total) by mouth daily. 30 tablet 0  . HYDROcodone-acetaminophen (NORCO) 10-325 MG per tablet Take 1 tablet by mouth every 8 (eight) hours as needed. 30 tablet 0  . pantoprazole (PROTONIX) 20 MG tablet Take 1 tablet (20 mg total) by mouth daily. (Patient not taking: Reported on 01/17/2015) 30 tablet 2   No current  facility-administered medications on file prior to visit.    ALLERGIES: No Known Allergies  FAMILY HISTORY: Family History  Problem Relation Age of Onset  . Congestive Heart Failure Mother [id number]    Deceased  . Stroke Mother   . Stroke Brother     multiple [6]  . Hypertension Brother     x3  . Hypertension Sister     x4  . Diabetes Sister   . Diabetes Brother   . Diabetes Mother   . Diabetes Maternal Grandmother   . Breast cancer Maternal Aunt   . Heart attack Maternal Uncle   . Healthy Son     x1  . Healthy Daughter     x2    SOCIAL HISTORY: History   Social History  . Marital Status: Single    Spouse Name: N/A  . Number of Children: N/A  . Years of Education: N/A   Occupational History  . Not on file.   Social History Main Topics  . Smoking status: Never Smoker   . Smokeless tobacco: Never Used  . Alcohol Use: No     Comment: rare   . Drug Use: No  . Sexual Activity:    Partners: Male   Other Topics Concern  . Not on file   Social History Narrative    REVIEW OF SYSTEMS: Constitutional: No fevers, chills, or sweats, no generalized fatigue, change in appetite Eyes: No visual changes, double vision, eye pain Ear, nose and throat: No hearing loss, ear pain, nasal congestion, sore throat Cardiovascular: No chest pain, palpitations Respiratory:  No shortness of breath at rest or with exertion, wheezes GastrointestinaI: No nausea, vomiting, diarrhea, abdominal pain, fecal incontinence Genitourinary:  No dysuria, urinary retention or frequency Musculoskeletal:  No neck pain, back pain Integumentary: No rash, pruritus, skin lesions Neurological: as above Psychiatric: No depression, insomnia, anxiety Endocrine: No palpitations, fatigue, diaphoresis, mood swings, change in appetite, change in weight, increased thirst Hematologic/Lymphatic:  No anemia, purpura, petechiae. Allergic/Immunologic: no itchy/runny eyes, nasal congestion, recent allergic reactions,  rashes  PHYSICAL EXAM: Filed Vitals:   01/17/15 0954  BP: 112/88  Pulse: 76  Resp: 20   General: No acute distress Head:  Normocephalic/atraumatic Eyes:  fundi unremarkable, without vessel changes, exudates, hemorrhages or papilledema. Neck: supple, no paraspinal tenderness, full range of motion Back: No paraspinal tenderness Heart: regular rate and rhythm Lungs: Clear to auscultation bilaterally. Vascular: No carotid bruits. Neurological Exam: Mental status: alert and oriented to person, place, and time, recent and remote memory intact, fund of knowledge intact, attention and concentration intact, speech fluent and not dysarthric, language intact. Cranial nerves: CN I: not tested CN II: pupils equal, round and reactive to light, visual fields intact, fundi unremarkable, without vessel changes, exudates, hemorrhages or papilledema. CN III, IV, VI:  full range of motion, no nystagmus, no ptosis CN V: facial sensation intact CN VII: upper  and lower face symmetric CN VIII: hearing intact CN IX, X: gag intact, uvula midline CN XI: sternocleidomastoid and trapezius muscles intact CN XII: tongue midline Bulk & Tone: normal, no fasciculations. Motor:  5/5 throughout Sensation:  Temperature and vibration intact Deep Tendon Reflexes:  2+ throughout, toes downgoing Finger to nose testing:  No dysmetria Heel to shin:  No dysmetria Gait:  Normal station and stride.  Able to turn and walk in tandem. Romberg negative.  IMPRESSION: 1.  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  Episodic recurrent left sided numbness and weakness.  Unlikely to be TIA given that these are recurrent habitual events (recurrent TIAs don't usually present the same way and if they did, she would have likely already had a stroke).  I suspect this is migraine-related, even though it doesn't always occur with headache.  As per prior ED note from a year ago, this is a recurrent problem that far exceeds just 4 months ago.   However, she says she doesn't remember. 3.  Abnormal white matter on MRI of brain.  It is not very impressive.  In light of probability that she does indeed have history of migraine based on her chart, it is likely related to that.  However, since she has a family history of TIAs in the 46s, hypercoagulable state and vasculitis should be ruled out.  PLAN: 1.  Advised to start amitriptyline  daily.  She should call in 4 weeks with update. 2.  For abortive therapy, will continue Excedrin but discontinue Fioricet due to increased risk for rebound headache.   3.  Check hypercoagulable and vasculitis panels 4.  Repeat MRI of brain with and without contrast in 6 months. 5.  Since I don't think these are TIAs, she may discontinue ASA (she hasn't been taking it anyway). 6.  Follow up in 3 months.  Thank you for allowing me to take part in the care of this patient.  Shon Millet, DO  CC:  Marcelline Mates, PA-C

## 2015-01-18 LAB — ANCA SCREEN W REFLEX TITER
ATYPICAL P-ANCA SCREEN: NEGATIVE
P-ANCA SCREEN: NEGATIVE
c-ANCA Screen: NEGATIVE

## 2015-01-18 LAB — C3 AND C4
C3 Complement: 155 mg/dL (ref 90–180)
C4 Complement: 34 mg/dL (ref 10–40)

## 2015-01-18 LAB — SJOGRENS SYNDROME-A EXTRACTABLE NUCLEAR ANTIBODY: SSA (Ro) (ENA) Antibody, IgG: <1

## 2015-01-18 LAB — ANA: Anti Nuclear Antibody(ANA): NEGATIVE

## 2015-01-19 ENCOUNTER — Ambulatory Visit (INDEPENDENT_AMBULATORY_CARE_PROVIDER_SITE_OTHER): Payer: BLUE CROSS/BLUE SHIELD | Admitting: Physician Assistant

## 2015-01-19 ENCOUNTER — Encounter: Payer: Self-pay | Admitting: Physician Assistant

## 2015-01-19 VITALS — BP 127/79 | HR 77 | Temp 97.7°F | Resp 16 | Ht 67.0 in | Wt 229.5 lb

## 2015-01-19 DIAGNOSIS — R51 Headache: Secondary | ICD-10-CM

## 2015-01-19 DIAGNOSIS — R519 Headache, unspecified: Secondary | ICD-10-CM

## 2015-01-19 LAB — CARDIOLIPIN ANTIBODIES, IGG, IGM, IGA
ANTICARDIOLIPIN IGM: 0 [MPL'U]/mL (ref <11)
Anticardiolipin IgA: 0 APL U/mL (ref <22)
Anticardiolipin IgG: 8 GPL U/mL (ref <23)

## 2015-01-19 LAB — BETA-2 GLYCOPROTEIN ANTIBODIES
Beta-2 Glyco I IgG: 8 G Units (ref <20)
Beta-2-Glycoprotein I IgA: 4 A Units (ref <20)
Beta-2-Glycoprotein I IgM: 7 M Units (ref <20)

## 2015-01-19 NOTE — Progress Notes (Signed)
Pre visit review using our clinic review tool, if applicable. No additional management support is needed unless otherwise documented below in the visit note/SLS  

## 2015-01-19 NOTE — Progress Notes (Signed)
Patient presents to clinic today for follow-up of migraine headaches and paresthesias.  Patient was finally seen by Neurology for the first time yesterday.  Was told to stop Fioricet and use Excedrin for headache abortive therapy. Was encouraged to start the Elavil that she was prescribed but had not previously been taking. Workup for vasculitis in process from Dr. Tomi Macdonald.   Denies headache since taking Elavil.  Still having paresthesias. Endorses an episode of twitching in sleep each night over the past few weeks.  Denies daytime "twitching".  Denies hx of seizure.  Past Medical History  Diagnosis Date  . GERD (gastroesophageal reflux disease)   . HA (headache)   . History of chicken pox   . Migraine     Current Outpatient Prescriptions on File Prior to Visit  Medication Sig Dispense Refill  . amitriptyline (ELAVIL) 10 MG tablet Take 1 tablet (10 mg total) by mouth at bedtime. 30 tablet 1  . aspirin 81 MG chewable tablet Chew 1 tablet (81 mg total) by mouth daily. 30 tablet 0  . HYDROcodone-acetaminophen (NORCO) 10-325 MG per tablet Take 1 tablet by mouth every 8 (eight) hours as needed. 30 tablet 0  . pantoprazole (PROTONIX) 20 MG tablet Take 1 tablet (20 mg total) by mouth daily. 30 tablet 2   No current facility-administered medications on file prior to visit.    No Known Allergies  Family History  Problem Relation Age of Onset  . Congestive Heart Failure Mother 38    Deceased  . Stroke Mother   . Stroke Brother     multiple [6]  . Hypertension Brother     x3  . Hypertension Sister     x4  . Diabetes Sister   . Diabetes Brother   . Diabetes Mother   . Diabetes Maternal Grandmother   . Breast cancer Maternal Aunt   . Heart attack Maternal Uncle   . Healthy Son     x1  . Healthy Daughter     x2    History   Social History  . Marital Status: Single    Spouse Name: N/A  . Number of Children: N/A  . Years of Education: N/A   Social History Main Topics  .  Smoking status: Never Smoker   . Smokeless tobacco: Never Used  . Alcohol Use: No     Comment: rare   . Drug Use: No  . Sexual Activity:    Partners: Male   Other Topics Concern  . None   Social History Narrative    Review of Systems - See HPI.  All other ROS are negative.  BP 127/79 mmHg  Pulse 77  Temp(Src) 97.7 F (36.5 C) (Oral)  Resp 16  Ht $R'5\' 7"'JG$  (1.702 m)  Wt 229 lb 8 oz (104.101 kg)  BMI 35.94 kg/m2  SpO2 99%  LMP 01/02/2015  Physical Exam  Constitutional: She is well-developed, well-nourished, and in no distress.  HENT:  Head: Normocephalic and atraumatic.  Eyes: Conjunctivae are normal. Pupils are equal, round, and reactive to light.  Neck: Neck supple.  Cardiovascular: Normal rate, regular rhythm, normal heart sounds and intact distal pulses.   Pulmonary/Chest: Effort normal and breath sounds normal. No respiratory distress. She has no wheezes. She has no rales. She exhibits no tenderness.  Neurological: She is alert. She displays normal reflexes. No cranial nerve deficit. Coordination normal. GCS score is 15.  Skin: Skin is warm and dry. No rash noted.  Psychiatric: Affect normal.  Vitals reviewed.  Recent Results (from the past 2160 hour(s))  Pregnancy, urine     Status: None   Collection Time: 11/28/14  1:09 PM  Result Value Ref Range   Preg Test, Ur NEGATIVE NEGATIVE  CBC with Differential     Status: None   Collection Time: 11/28/14  2:00 PM  Result Value Ref Range   WBC 10.3 4.0 - 10.5 K/uL   RBC 4.25 3.87 - 5.11 MIL/uL   Hemoglobin 13.3 12.0 - 15.0 g/dL   HCT 40.5 36.0 - 46.0 %   MCV 95.3 78.0 - 100.0 fL   MCH 31.3 26.0 - 34.0 pg   MCHC 32.8 30.0 - 36.0 g/dL   RDW 12.8 11.5 - 15.5 %   Platelets 278 150 - 400 K/uL   Neutrophils Relative % 66 43 - 77 %   Neutro Abs 6.8 1.7 - 7.7 K/uL   Lymphocytes Relative 24 12 - 46 %   Lymphs Abs 2.5 0.7 - 4.0 K/uL   Monocytes Relative 9 3 - 12 %   Monocytes Absolute 1.0 0.1 - 1.0 K/uL   Eosinophils  Relative 1 0 - 5 %   Eosinophils Absolute 0.1 0.0 - 0.7 K/uL   Basophils Relative 0 0 - 1 %   Basophils Absolute 0.0 0.0 - 0.1 K/uL  Basic metabolic panel     Status: Abnormal   Collection Time: 11/28/14  2:00 PM  Result Value Ref Range   Sodium 134 (L) 135 - 145 mmol/L   Potassium 3.7 3.5 - 5.1 mmol/L   Chloride 109 96 - 112 mmol/L   CO2 23 19 - 32 mmol/L   Glucose, Bld 100 (H) 70 - 99 mg/dL   BUN 9 6 - 23 mg/dL   Creatinine, Ser 0.77 0.50 - 1.10 mg/dL   Calcium 8.3 (L) 8.4 - 10.5 mg/dL   GFR calc non Af Amer >90 >90 mL/min   GFR calc Af Amer >90 >90 mL/min    Comment: (NOTE) The eGFR has been calculated using the CKD EPI equation. This calculation has not been validated in all clinical situations. eGFR's persistently <90 mL/min signify possible Chronic Kidney Disease.    Anion gap 2 (L) 5 - 15  POCT Influenza A/B     Status: Normal   Collection Time: 12/01/14  8:25 AM  Result Value Ref Range   Influenza A, POC Negative    Influenza B, POC Negative   CBC     Status: Abnormal   Collection Time: 12/01/14  8:43 AM  Result Value Ref Range   WBC 13.9 (H) 4.0 - 10.5 K/uL   RBC 4.46 3.87 - 5.11 Mil/uL   Platelets 280.0 150.0 - 400.0 K/uL   Hemoglobin 14.1 12.0 - 15.0 g/dL   HCT 41.1 36.0 - 46.0 %   MCV 92.1 78.0 - 100.0 fl   MCHC 34.5 30.0 - 36.0 g/dL   RDW 14.0 11.5 - 55.3 %  Basic Metabolic Panel (BMET)     Status: Abnormal   Collection Time: 12/01/14  8:43 AM  Result Value Ref Range   Sodium 133 (L) 135 - 145 mEq/L   Potassium 3.8 3.5 - 5.1 mEq/L   Chloride 101 96 - 112 mEq/L   CO2 26 19 - 32 mEq/L   Glucose, Bld 108 (H) 70 - 99 mg/dL   BUN 8 6 - 23 mg/dL   Creatinine, Ser 1.03 0.40 - 1.20 mg/dL   Calcium 9.4 8.4 - 10.5 mg/dL   GFR 75.14 >  60.00 mL/min  B12     Status: None   Collection Time: 12/01/14  8:43 AM  Result Value Ref Range   Vitamin B-12 561 211 - 911 pg/mL  Lipid panel     Status: None   Collection Time: 12/01/14  8:43 AM  Result Value Ref Range    Cholesterol 150 0 - 200 mg/dL    Comment: ATP III Classification       Desirable:  < 200 mg/dL               Borderline High:  200 - 239 mg/dL          High:  > = 240 mg/dL   Triglycerides 71.0 0.0 - 149.0 mg/dL    Comment: Normal:  <150 mg/dLBorderline High:  150 - 199 mg/dL   HDL 43.80 >39.00 mg/dL   VLDL 14.2 0.0 - 40.0 mg/dL   LDL Cholesterol 92 0 - 99 mg/dL   Total CHOL/HDL Ratio 3     Comment:                Men          Women1/2 Average Risk     3.4          3.3Average Risk          5.0          4.42X Average Risk          9.6          7.13X Average Risk          15.0          11.0                       NonHDL 106.20     Comment: NOTE:  Non-HDL goal should be 30 mg/dL higher than patient's LDL goal (i.e. LDL goal of < 70 mg/dL, would have non-HDL goal of < 100 mg/dL)    Assessment/Plan: Frequent headaches Improving with compliance to Elavil.  Supportive measures discussed.  Repeat Neuro exam within normal limits. Unsure about "twitching" episodes.  Will speak to Dr. Tomi Macdonald regarding potential EEG and earlier follow-up.  Reassurance given to patient that Neurology will call her once labs have resulted.

## 2015-01-19 NOTE — Patient Instructions (Signed)
Please continue Elavil nightly as directed to prevent headaches. Use Excedrin for any headache symptoms.  We are waiting on your results from Dr. Moises BloodJaffe's office. He will call you once they are in. I am going to speak with him regarding new symptoms to see if an EEG is warranted.

## 2015-01-19 NOTE — Assessment & Plan Note (Signed)
Improving with compliance to Elavil.  Supportive measures discussed.  Repeat Neuro exam within normal limits. Unsure about "twitching" episodes.  Will speak to Dr. Everlena CooperJaffe regarding potential EEG and earlier follow-up.  Reassurance given to patient that Neurology will call her once labs have resulted.

## 2015-01-20 ENCOUNTER — Other Ambulatory Visit: Payer: Self-pay | Admitting: *Deleted

## 2015-01-20 DIAGNOSIS — R200 Anesthesia of skin: Secondary | ICD-10-CM

## 2015-01-20 DIAGNOSIS — R202 Paresthesia of skin: Principal | ICD-10-CM

## 2015-01-20 DIAGNOSIS — R9082 White matter disease, unspecified: Secondary | ICD-10-CM

## 2015-01-20 LAB — LUPUS ANTICOAGULANT PANEL
DRVVT: 30.3 s (ref <42.9)
LUPUS ANTICOAGULANT: NOT DETECTED
PTT LA: 42.4 s (ref 28.0–43.0)

## 2015-01-20 LAB — PROTEIN S ACTIVITY: Protein S Activity: 147 % — ABNORMAL HIGH (ref 69–129)

## 2015-01-20 LAB — FACTOR 5 LEIDEN

## 2015-01-20 LAB — PROTEIN S, TOTAL: Protein S Total: 119 % (ref 60–150)

## 2015-01-20 LAB — PROTEIN C ACTIVITY: PROTEIN C ACTIVITY: 164 % — AB (ref 75–133)

## 2015-01-20 LAB — PROTEIN C, TOTAL: Protein C, Total: 105 % (ref 72–160)

## 2015-01-20 LAB — PROTHROMBIN GENE MUTATION

## 2015-01-20 LAB — ANTITHROMBIN III: ANTITHROMB III FUNC: 85 % (ref 76–126)

## 2015-01-22 LAB — CRYOGLOBULIN

## 2015-01-25 LAB — PHOSPHATIDYLSERINE IGG, IGA, IGM
Phosphatidylserine IgG Autoantibodies: 5 U/mL (ref <16)
Phosphatydalserine, IgA: 7 U/mL (ref <20)
Phosphatydalserine, IgM: 4 U/mL (ref <22)

## 2015-01-26 ENCOUNTER — Telehealth: Payer: Self-pay | Admitting: *Deleted

## 2015-01-26 ENCOUNTER — Other Ambulatory Visit: Payer: BLUE CROSS/BLUE SHIELD

## 2015-01-26 NOTE — Telephone Encounter (Signed)
-----   Message from Drema DallasAdam R Jaffe, DO sent at 01/26/2015  1:52 PM EDT ----- Blood work looks unrevealing. ----- Message -----    From: [facility]    Sent: 01/25/2015   2:21 PM      To: Drema DallasAdam R Jaffe, DO

## 2015-01-26 NOTE — Telephone Encounter (Signed)
Patient is aware that Blood work looks unrevealing.

## 2015-02-01 ENCOUNTER — Other Ambulatory Visit: Payer: BLUE CROSS/BLUE SHIELD

## 2015-02-08 ENCOUNTER — Encounter: Payer: Self-pay | Admitting: Physician Assistant

## 2015-02-08 ENCOUNTER — Ambulatory Visit (INDEPENDENT_AMBULATORY_CARE_PROVIDER_SITE_OTHER): Payer: BLUE CROSS/BLUE SHIELD | Admitting: Physician Assistant

## 2015-02-08 ENCOUNTER — Other Ambulatory Visit: Payer: Self-pay | Admitting: Physician Assistant

## 2015-02-08 VITALS — BP 110/80 | HR 90 | Temp 98.0°F | Wt 228.0 lb

## 2015-02-08 DIAGNOSIS — A084 Viral intestinal infection, unspecified: Secondary | ICD-10-CM | POA: Diagnosis not present

## 2015-02-08 LAB — CBC WITH DIFFERENTIAL/PLATELET
Basophils Absolute: 0 10*3/uL (ref 0.0–0.1)
Basophils Relative: 0.4 % (ref 0.0–3.0)
Eosinophils Absolute: 0.1 10*3/uL (ref 0.0–0.7)
Eosinophils Relative: 1 % (ref 0.0–5.0)
HEMATOCRIT: 38.8 % (ref 36.0–46.0)
HEMOGLOBIN: 13.5 g/dL (ref 12.0–15.0)
LYMPHS ABS: 2.4 10*3/uL (ref 0.7–4.0)
Lymphocytes Relative: 23.4 % (ref 12.0–46.0)
MCHC: 34.8 g/dL (ref 30.0–36.0)
MCV: 92.3 fl (ref 78.0–100.0)
Monocytes Absolute: 0.8 10*3/uL (ref 0.1–1.0)
Monocytes Relative: 7.6 % (ref 3.0–12.0)
NEUTROS ABS: 7 10*3/uL (ref 1.4–7.7)
Neutrophils Relative %: 67.6 % (ref 43.0–77.0)
Platelets: 313 10*3/uL (ref 150.0–400.0)
RBC: 4.2 Mil/uL (ref 3.87–5.11)
RDW: 13.9 % (ref 11.5–15.5)
WBC: 10.3 10*3/uL (ref 4.0–10.5)

## 2015-02-08 LAB — BASIC METABOLIC PANEL
BUN: 9 mg/dL (ref 6–23)
CO2: 28 meq/L (ref 19–32)
CREATININE: 0.93 mg/dL (ref 0.40–1.20)
Calcium: 9.5 mg/dL (ref 8.4–10.5)
Chloride: 104 mEq/L (ref 96–112)
GFR: 84.47 mL/min (ref >60.00)
Glucose, Bld: 107 mg/dL — ABNORMAL HIGH (ref 70–99)
Potassium: 3.4 mEq/L — ABNORMAL LOW (ref 3.5–5.1)
Sodium: 136 mEq/L (ref 135–145)

## 2015-02-08 MED ORDER — OMEPRAZOLE 20 MG PO CPDR: 20.0000 mg | DELAYED_RELEASE_CAPSULE | Freq: Every day | ORAL | Status: DC

## 2015-02-08 MED ORDER — ONDANSETRON 8 MG PO TBDP: 8.0000 mg | ORAL_TABLET | Freq: Three times a day (TID) | ORAL | Status: DC | PRN

## 2015-02-08 MED ORDER — PANTOPRAZOLE SODIUM 20 MG PO TBEC: 20.0000 mg | DELAYED_RELEASE_TABLET | Freq: Every day | ORAL | Status: DC

## 2015-02-08 NOTE — Patient Instructions (Addendum)
Please go to the lab for blood work. I will call you with your results. Stay well hydrated and start the dietary measures listed below. Take the Protonix for reflux and Zofran for nausea.  Viral Gastroenteritis Viral gastroenteritis is also called stomach flu. This illness is caused by a certain type of germ (virus). It can cause sudden watery poop (diarrhea) and throwing up (vomiting). This can cause you to lose body fluids (dehydration). This illness usually lasts for 3 to 8 days. It usually goes away on its own. HOME CARE   Drink enough fluids to keep your pee (urine) clear or pale yellow. Drink small amounts of fluids often.  Ask your doctor how to replace body fluid losses (rehydration).  Avoid:  Foods high in sugar.  Alcohol.  Bubbly (carbonated) drinks.  Tobacco.  Juice.  Caffeine drinks.  Very hot or cold fluids.  Fatty, greasy foods.  Eating too much at one time.  Dairy products until 24 to 48 hours after your watery poop stops.  You may eat foods with active cultures (probiotics). They can be found in some yogurts and supplements.  Wash your hands well to avoid spreading the illness.  Only take medicines as told by your doctor. Do not give aspirin to children. Do not take medicines for watery poop (antidiarrheals).  Ask your doctor if you should keep taking your regular medicines.  Keep all doctor visits as told. GET HELP RIGHT AWAY IF:   You cannot keep fluids down.  You do not pee at least once every 6 to 8 hours.  You are short of breath.  You see blood in your poop or throw up. This may look like coffee grounds.  You have belly (abdominal) pain that gets worse or is just in one small spot (localized).  You keep throwing up or having watery poop.  You have a fever.  The patient is a child younger than 3 months, and he or she has a fever.  The patient is a child older than 3 months, and he or she has a fever and problems that do not go  away.  The patient is a child older than 3 months, and he or she has a fever and problems that suddenly get worse.  The patient is a baby, and he or she has no tears when crying. MAKE SURE YOU:   Understand these instructions.  Will watch your condition.  Will get help right away if you are not doing well or get worse. Document Released: 03/05/2008 Document Revised: 12/10/2011 Document Reviewed: 07/04/2011 [facility] Patient Information 2015 [place] Center[place]Care, [place]. This information is not intended to replace advice given to you by your health care provider. Make sure you discuss any questions you have with your health care provider.  Food Choices to Help Relieve Diarrhea When you have diarrhea, the foods you eat and your eating habits are very important. Choosing the right foods and drinks can help relieve diarrhea. Also, because diarrhea can last up to 7 days, you need to replace lost fluids and electrolytes (such as sodium, potassium, and chloride) in order to help prevent dehydration.  WHAT GENERAL GUIDELINES DO I NEED TO FOLLOW?  Slowly drink 1 cup (8 oz) of fluid for each episode of diarrhea. If you are getting enough fluid, your urine will be clear or pale yellow.  Eat starchy foods. Some good choices include white rice, white toast, pasta, low-fiber cereal, baked potatoes (without the skin), saltine crackers, and bagels.  Avoid large servings  of any cooked vegetables.  Limit fruit to two servings per day. A serving is  cup or 1 small piece.  Choose foods with less than 2 g of fiber per serving.  Limit fats to less than 8 tsp (38 g) per day.  Avoid fried foods.  Eat foods that have probiotics in them. Probiotics can be found in certain dairy products.  Avoid foods and beverages that may increase the speed at which food moves through the stomach and intestines (gastrointestinal tract). Things to avoid include:  High-fiber foods, such as dried fruit, raw fruits and vegetables, nuts,  seeds, and whole grain foods.  Spicy foods and high-fat foods.  Foods and beverages sweetened with high-fructose corn syrup, honey, or sugar alcohols such as xylitol, sorbitol, and mannitol. WHAT FOODS ARE RECOMMENDED? Grains White rice. White, [place], or pita breads (fresh or toasted), including plain rolls, buns, or bagels. White pasta. Saltine, soda, or graham crackers. Pretzels. Low-fiber cereal. Cooked cereals made with water (such as cornmeal, farina, or cream cereals). Plain muffins. Matzo. Melba toast. Zwieback.  Vegetables Potatoes (without the skin). Strained tomato and vegetable juices. Most well-cooked and canned vegetables without seeds. Tender lettuce. Fruits Cooked or canned applesauce, apricots, cherries, fruit cocktail, grapefruit, peaches, pears, or plums. Fresh bananas, apples without skin, cherries, grapes, cantaloupe, grapefruit, peaches, oranges, or plums.  Meat and Other Protein Products Baked or boiled chicken. Eggs. Tofu. Fish. Seafood. Smooth peanut butter. Ground or well-cooked tender beef, ham, veal, lamb, pork, or poultry.  Dairy Plain yogurt, kefir, and unsweetened liquid yogurt. Lactose-free milk, buttermilk, or soy milk. Plain hard cheese. Beverages Sport drinks. Clear broths. Diluted fruit juices (except prune). Regular, caffeine-free sodas such as ginger ale. Water. Decaffeinated teas. Oral rehydration solutions. Sugar-free beverages not sweetened with sugar alcohols. Other Bouillon, broth, or soups made from recommended foods.  The items listed above may not be a complete list of recommended foods or beverages. Contact your dietitian for more options. WHAT FOODS ARE NOT RECOMMENDED? Grains Whole grain, whole wheat, bran, or rye breads, rolls, pastas, crackers, and cereals. Wild or brown rice. Cereals that contain more than 2 g of fiber per serving. Corn tortillas or taco shells. Cooked or dry oatmeal. Granola. Popcorn. Vegetables Raw vegetables. Cabbage,  broccoli, Brussels sprouts, artichokes, baked beans, beet greens, corn, kale, legumes, peas, sweet potatoes, and yams. Potato skins. Cooked spinach and cabbage. Fruits Dried fruit, including raisins and dates. Raw fruits. Stewed or dried prunes. Fresh apples with skin, apricots, mangoes, pears, raspberries, and strawberries.  Meat and Other Protein Products Chunky peanut butter. Nuts and seeds. Beans and lentils. Tomasa Blase.  Dairy High-fat cheeses. Milk, chocolate milk, and beverages made with milk, such as milk shakes. Cream. Ice cream. Sweets and Desserts Sweet rolls, doughnuts, and sweet breads. Pancakes and waffles. Fats and Oils Butter. Cream sauces. Margarine. Salad oils. Plain salad dressings. Olives. Avocados.  Beverages Caffeinated beverages (such as coffee, tea, soda, or energy drinks). Alcoholic beverages. Fruit juices with pulp. Prune juice. Soft drinks sweetened with high-fructose corn syrup or sugar alcohols. Other Coconut. Hot sauce. Chili powder. Mayonnaise. Gravy. Cream-based or milk-based soups.  The items listed above may not be a complete list of foods and beverages to avoid. Contact your dietitian for more information. WHAT SHOULD I DO IF I BECOME DEHYDRATED? Diarrhea can sometimes lead to dehydration. Signs of dehydration include dark urine and dry mouth and skin. If you think you are dehydrated, you should rehydrate with an oral rehydration solution. These solutions can be  purchased at pharmacies, retail stores, or online.  Drink -1 cup (120-240 mL) of oral rehydration solution each time you have an episode of diarrhea. If drinking this amount makes your diarrhea worse, try drinking smaller amounts more often. For example, drink 1-3 tsp (5-15 mL) every 5-10 minutes.  A general rule for staying hydrated is to drink 1-2 L of fluid per day. Talk to your health care provider about the specific amount you should be drinking each day. Drink enough fluids to keep your urine clear or  pale yellow. Document Released: 12/08/2003 Document Revised: 09/22/2013 Document Reviewed: 08/10/2013 [facility]Exit[facility] Patient Information 2015 [place], [place]. This information is not intended to replace advice given to you by your health care provider. Make sure you discuss any questions you have with your health care provider.

## 2015-02-08 NOTE — Assessment & Plan Note (Signed)
Will obtain CBC w diff and BMP to further assess.  Increase fluids.  BRAT diet discussed and handout given in AVS. Rx Zofran and Protonix refilled.  Follow-up if not improving.

## 2015-02-08 NOTE — Progress Notes (Signed)
Pre visit review using our clinic review tool, if applicable. No additional management support is needed unless otherwise documented below in the visit note. 

## 2015-02-08 NOTE — Progress Notes (Signed)
Patient presents to clinic today c/o nausea, vomiting and loose stools x 4 days with headaches and fatigue. Denies fever, chills, recent travel or sick contact.  Denies abnormal food or water source.  Endorses last episode of emesis last night.   Past Medical History  Diagnosis Date  . GERD (gastroesophageal reflux disease)   . HA (headache)   . History of chicken pox   . Migraine     Current Outpatient Prescriptions on File Prior to Visit  Medication Sig Dispense Refill  . amitriptyline (ELAVIL) 10 MG tablet Take 1 tablet (10 mg total) by mouth at bedtime. 30 tablet 1  . aspirin 81 MG chewable tablet Chew 1 tablet (81 mg total) by mouth daily. 30 tablet 0  . HYDROcodone-acetaminophen (NORCO) 10-325 MG per tablet Take 1 tablet by mouth every 8 (eight) hours as needed. 30 tablet 0   No current facility-administered medications on file prior to visit.    No Known Allergies  Family History  Problem Relation Age of Onset  . Congestive Heart Failure Mother 64    Deceased  . Stroke Mother   . Stroke Brother     multiple [6]  . Hypertension Brother     x3  . Hypertension Sister     x4  . Diabetes Sister   . Diabetes Brother   . Diabetes Mother   . Diabetes Maternal Grandmother   . Breast cancer Maternal Aunt   . Heart attack Maternal Uncle   . Healthy Son     x1  . Healthy Daughter     x2    History   Social History  . Marital Status: Single    Spouse Name: N/A  . Number of Children: N/A  . Years of Education: N/A   Social History Main Topics  . Smoking status: Never Smoker   . Smokeless tobacco: Never Used  . Alcohol Use: No     Comment: rare   . Drug Use: No  . Sexual Activity:    Partners: Male   Other Topics Concern  . None   Social History Narrative    Review of Systems - See HPI.  All other ROS are negative.  BP 110/80 mmHg  Pulse 90  Temp(Src) 98 F (36.7 C)  Wt 228 lb (103.42 kg)  SpO2 99%  LMP 01/02/2015  Physical Exam  Recent  Results (from the past 2160 hour(s))  Pregnancy, urine     Status: None   Collection Time: 11/28/14  1:09 PM  Result Value Ref Range   Preg Test, Ur NEGATIVE NEGATIVE  CBC with Differential     Status: None   Collection Time: 11/28/14  2:00 PM  Result Value Ref Range   WBC 10.3 4.0 - 10.5 K/uL   RBC 4.25 3.87 - 5.11 MIL/uL   Hemoglobin 13.3 12.0 - 15.0 g/dL   HCT 40.5 36.0 - 46.0 %   MCV 95.3 78.0 - 100.0 fL   MCH 31.3 26.0 - 34.0 pg   MCHC 32.8 30.0 - 36.0 g/dL   RDW 12.8 11.5 - 15.5 %   Platelets 278 150 - 400 K/uL   Neutrophils Relative % 66 43 - 77 %   Neutro Abs 6.8 1.7 - 7.7 K/uL   Lymphocytes Relative 24 12 - 46 %   Lymphs Abs 2.5 0.7 - 4.0 K/uL   Monocytes Relative 9 3 - 12 %   Monocytes Absolute 1.0 0.1 - 1.0 K/uL   Eosinophils Relative 1 0 -  5 %   Eosinophils Absolute 0.1 0.0 - 0.7 K/uL   Basophils Relative 0 0 - 1 %   Basophils Absolute 0.0 0.0 - 0.1 K/uL  Basic metabolic panel     Status: Abnormal   Collection Time: 11/28/14  2:00 PM  Result Value Ref Range   Sodium 134 (L) 135 - 145 mmol/L   Potassium 3.7 3.5 - 5.1 mmol/L   Chloride 109 96 - 112 mmol/L   CO2 23 19 - 32 mmol/L   Glucose, Bld 100 (H) 70 - 99 mg/dL   BUN 9 6 - 23 mg/dL   Creatinine, Ser 0.77 0.50 - 1.10 mg/dL   Calcium 8.3 (L) 8.4 - 10.5 mg/dL   GFR calc non Af Amer >90 >90 mL/min   GFR calc Af Amer >90 >90 mL/min    Comment: (NOTE) The eGFR has been calculated using the CKD EPI equation. This calculation has not been validated in all clinical situations. eGFR's persistently <90 mL/min signify possible Chronic Kidney Disease.    Anion gap 2 (L) 5 - 15  POCT Influenza A/B     Status: Normal   Collection Time: 12/01/14  8:25 AM  Result Value Ref Range   Influenza A, POC Negative    Influenza B, POC Negative   CBC     Status: Abnormal   Collection Time: 12/01/14  8:43 AM  Result Value Ref Range   WBC 13.9 (H) 4.0 - 10.5 K/uL   RBC 4.46 3.87 - 5.11 Mil/uL   Platelets 280.0 150.0 - 400.0  K/uL   Hemoglobin 14.1 12.0 - 15.0 g/dL   HCT 41.1 36.0 - 46.0 %   MCV 92.1 78.0 - 100.0 fl   MCHC 34.5 30.0 - 36.0 g/dL   RDW 14.0 11.5 - 38.8 %  Basic Metabolic Panel (BMET)     Status: Abnormal   Collection Time: 12/01/14  8:43 AM  Result Value Ref Range   Sodium 133 (L) 135 - 145 mEq/L   Potassium 3.8 3.5 - 5.1 mEq/L   Chloride 101 96 - 112 mEq/L   CO2 26 19 - 32 mEq/L   Glucose, Bld 108 (H) 70 - 99 mg/dL   BUN 8 6 - 23 mg/dL   Creatinine, Ser 1.03 0.40 - 1.20 mg/dL   Calcium 9.4 8.4 - 10.5 mg/dL   GFR 75.14 >60.00 mL/min  B12     Status: None   Collection Time: 12/01/14  8:43 AM  Result Value Ref Range   Vitamin B-12 561 211 - 911 pg/mL  Lipid panel     Status: None   Collection Time: 12/01/14  8:43 AM  Result Value Ref Range   Cholesterol 150 0 - 200 mg/dL    Comment: ATP III Classification       Desirable:  < 200 mg/dL               Borderline High:  200 - 239 mg/dL          High:  > = 240 mg/dL   Triglycerides 71.0 0.0 - 149.0 mg/dL    Comment: Normal:  <150 mg/dLBorderline High:  150 - 199 mg/dL   HDL 43.80 >39.00 mg/dL   VLDL 14.2 0.0 - 40.0 mg/dL   LDL Cholesterol 92 0 - 99 mg/dL   Total CHOL/HDL Ratio 3     Comment:                Men  Women1/2 Average Risk     3.4          3.3Average Risk          5.0          4.42X Average Risk          9.6          7.13X Average Risk          15.0          11.0                       NonHDL 106.20     Comment: NOTE:  Non-HDL goal should be 30 mg/dL higher than patient's LDL goal (i.e. LDL goal of < 70 mg/dL, would have non-HDL goal of < 100 mg/dL)  ANA     Status: None   Collection Time: 01/17/15 11:21 AM  Result Value Ref Range   Anit Nuclear Antibody(ANA) NEG NEGATIVE  C3 and C4     Status: None   Collection Time: 01/17/15 11:21 AM  Result Value Ref Range   C3 Complement 155 90 - 180 mg/dL   C4 Complement 34 10 - 40 mg/dL  Sedimentation rate     Status: None   Collection Time: 01/17/15 11:21 AM  Result Value Ref  Range   Sed Rate 12 0 - 20 mm/hr  C-reactive protein     Status: Abnormal   Collection Time: 01/17/15 11:21 AM  Result Value Ref Range   CRP 1.7 (H) <0.60 mg/dL  ANCA screen with reflex titer     Status: None   Collection Time: 01/17/15 11:21 AM  Result Value Ref Range   c-ANCA Screen NEGATIVE NEGATIVE   p-ANCA Screen NEGATIVE NEGATIVE   Atypical p-ANCA Screen NEGATIVE NEGATIVE    Comment: ANCA Screen includes evaluation for p-ANCA, c-ANCA and Atypical p-ANCA.   Cryoglobulin     Status: None   Collection Time: 01/17/15 11:21 AM  Result Value Ref Range   Cryoglobulin REPORT     Comment: Cryoglobulin, QL Analysis  None Detected                      Reference range:  None Detected   Antithrombin III     Status: None   Collection Time: 01/17/15 11:21 AM  Result Value Ref Range   AntiThromb III Func 85 76 - 126 %  Protein C, total     Status: None   Collection Time: 01/17/15 11:21 AM  Result Value Ref Range   Protein C, Total 105 72 - 160 %    Comment: ** Please note change in reference range(s). **  Protein S, total     Status: None   Collection Time: 01/17/15 11:21 AM  Result Value Ref Range   Protein S Total 119 60 - 150 %  Lupus anticoagulant panel     Status: None   Collection Time: 01/17/15 11:21 AM  Result Value Ref Range   PTT Lupus Anticoagulant 42.4 28.0 - 43.0 secs   PTTLA 4:1 Mix NOT APPL 28.0 - 43.0 secs   PTTLA Confirmation NOT APPL <8.0 secs   DRVVT 30.3 <42.9 secs   DRVVT 1:1 Mix NOT APPL <42.9 secs   Drvvt confirmation NOT APPL <1.15 Ratio   Lupus Anticoagulant NOT DETECTED NOT DETECTED  Phosphatidylserine IgG,IgA,IgM     Status: None   Collection Time: 01/17/15 11:21 AM  Result Value Ref Range   Phosphatidylserine IgG  Autoantibodies 5 <16 U/mL   Phosphatydalserine, IgM 4 <22 U/mL   Phosphatydalserine, IgA 7 <20 U/mL    Comment:   The Antiphospholipid Syndrome (APS) is a clinical-pathologic correlation that includes a clinical event (e.g. thrombosis,  pregnancy loss, thrombocytopenia) and persistent positive Antiphospholipid Antibodies (IgM or IgG ACA >40 MPL / GPL, IgM or IgG anti-B2GPI antibodies, or a Lupus Anticoagulant). The IgA isotype has been implicated in smaller studies, but has not yet been incorporated into the APS criteria. Reference J Thromb Haemost 2006: 4; 295   Beta-2 glycoprotein antibodies     Status: None   Collection Time: 01/17/15 11:21 AM  Result Value Ref Range   Beta-2 Glyco I IgG 8 <20 G Units   Beta-2-Glycoprotein I IgM 7 <20 M Units   Beta-2-Glycoprotein I IgA 4 <20 A Units  Cardiolipin antibodies, IgG, IgM, IgA     Status: None   Collection Time: 01/17/15 11:21 AM  Result Value Ref Range   Anticardiolipin IgG 8 <23 GPL U/mL   Anticardiolipin IgM 0 <11 MPL U/mL   Anticardiolipin IgA 0 <22 APL U/mL    Comment:   Reference Range:  Cardiolipin IgG    Normal                  <23    Low Positive (+)        23-35    Moderate Positive (+)   36-50    High Positive (+)       >50   Reference Range:  Cardiolipin IgM    Normal                  <11    Low Positive (+)        11-20    Moderate Positive (+)   21-30    High Positive (+)       >30    Reference Range:  Cardiolipin IgA    Normal                  <22    Low Positive (+)        22-35    Moderate Positive (+)   36-45    High Positive (+)       >45     Factor 5 leiden     Status: None   Collection Time: 01/17/15 11:21 AM  Result Value Ref Range   Result REPORT     Comment: FACTOR V LEIDEN (R506Q) MUTATION NOT DETECTED   Interpretation REPORT     Comment: This individual is negative (normal) for the Factor V Leiden (R506Q) mutation in the Factor V gene. Increased risk of thrombophilia can be caused by a variety of genetic and non-genetic factors not screened for by this assay.    Reviewer REPORT     Comment: Ileene Hutchinson, Ph.D., [facility] Director, Molecular Genetics SUPPLEMENTAL INFORMATION The Factor V Leiden (R506Q) mutation  [[id number]:c. 1601G>A (p.R534Q)] in the Factor V gene is one of the most common causes of inherited thrombophilia.  This mutation causes resistance to degradation of activated Factor V protein by activated Protein C (APC). The Factor V Leiden (R506Q) mutation is detected by amplification of the selected region of Factor V gene by polymerase chain reaction (PCR) and fluorescent probe hybridization to the targeted region, followed by melting curve analysis with a real time PCR system. Although rare, false positive or false negative results may occur.  All results should be interpreted in context  of clinical findings, relevant history, and other laboratory data. Health care providers, please contact your local [place] genetic counselor or call [phone]) for assistance with interpretation of these results. This test  was developed and its analytical performance characteristics have been determined by [employer], [place], [place]. It has not been cleared or approved by the U.S. Food and Drug Administration. The FDA has determined that such clearance or approval is not necessary. This assay has been validated pursuant to [employer] and is used for clinical purposes.   Protein C activity     Status: Abnormal   Collection Time: 01/17/15 11:21 AM  Result Value Ref Range   Protein C Activity 164 (H) 75 - 133 %  Protein S activity     Status: Abnormal   Collection Time: 01/17/15 11:21 AM  Result Value Ref Range   Protein S Activity 147 (H) 69 - 129 %  Prothrombin gene mutation     Status: None   Collection Time: 01/17/15 11:21 AM  Result Value Ref Range   Result REPORT     Comment: THE G20210A MUTATION NOT DETECTED   Interpretation REPORT     Comment: This individual is negative (normal) for the G20210A mutation in the Prothrombin/Factor II gene.  Increased risk of thrombophilia can be caused by a variety of genetic and  non-genetic factors not screened for by this assay.    Reviewer REPORT     Comment: W. Gailen Shelter, Ph.D., [facility] Director, Molecular Genetics SUPPLEMENTAL INFORMATION The G20210A mutation [[id number]:g.21538G>A (c.*97G>A)] in the Prothrombin/Factor II gene is the second most common inherited risk factor for thrombosis occurring in approximately 2% of Caucasians.  Presence of the mutation is associated with an elevation of prothrombin levels to about 30% above normal in heterozygotes and to 70% above normal in homozygotes. The G20210A mutation is detected by amplification of the selected region of Factor II gene by polymerase chain reaction (PCR) and fluorescent probe hybridization to the targeted region, followed by melting curve analysis with a real time PCR system. Although rare, false positive or false negative results may occur.  All results should be interpreted in context of clinical findings, relevant history, and other laboratory data. Health care providers, please contact your local [place] genetic counselor or call [phone] for assistance with interpretation of these results. This test was developed and its analytical performance characteristics have been determined by [employer], [place], [place]. It has not been cleared or approved by the U.S. Food and Drug Administration. The FDA has determined that such clearance or approval is not necessary. This assay has been validated pursuant to [employer] and is used for clinical purposes.   Sjogrens syndrome-A extractable nuclear antibody     Status: None   Collection Time: 01/17/15 11:21 AM  Result Value Ref Range   SSA (Ro) (ENA) Antibody, IgG <1.0 NEG <1.0 NEG AI    Assessment/Plan: Viral gastroenteritis Will obtain CBC w diff and BMP to further assess.  Increase fluids.  BRAT diet discussed and handout given in AVS. Rx Zofran and Protonix refilled.   Follow-up if not improving.

## 2015-02-09 ENCOUNTER — Encounter: Payer: Self-pay | Admitting: Physician Assistant

## 2015-02-09 ENCOUNTER — Telehealth: Payer: Self-pay | Admitting: Physician Assistant

## 2015-02-09 NOTE — Telephone Encounter (Signed)
Caller name: Patriciann Relation to pt: self Call back number: [phone] Pharmacy:  Reason for call:   Patient states that she was seen yesterday and is needing a work note for yesterday and today. Will pickup letter.

## 2015-02-09 NOTE — Telephone Encounter (Signed)
Note written. Placed at front desk for pickup. 

## 2015-02-09 NOTE — Telephone Encounter (Signed)
Left message informing patient of this.  °

## 2015-02-10 ENCOUNTER — Telehealth: Payer: Self-pay | Admitting: Physician Assistant

## 2015-02-10 ENCOUNTER — Encounter: Payer: Self-pay | Admitting: Neurology

## 2015-02-10 ENCOUNTER — Other Ambulatory Visit: Payer: BLUE CROSS/BLUE SHIELD

## 2015-02-10 DIAGNOSIS — Z029 Encounter for administrative examinations, unspecified: Secondary | ICD-10-CM

## 2015-02-10 NOTE — Telephone Encounter (Signed)
CallerSaundra Shelling: [name] Ph#: [phone]  Pt was in office 02/08/15. We provided note for her to be out of work 5/10-5/11. Pt states she did not go in today because she is having abnormal stomach pains. She asked if we would write note for her to be out of work 5/12-5/13. Offered pt appt for 5/13. She declined stating she cannot afford it.

## 2015-02-11 ENCOUNTER — Encounter: Payer: Self-pay | Admitting: Physician Assistant

## 2015-02-11 NOTE — Telephone Encounter (Signed)
Note ready for pick up 

## 2015-02-11 NOTE — Telephone Encounter (Signed)
Patient states that her daughter will pick up note

## 2015-02-14 DIAGNOSIS — Z7689 Persons encountering health services in other specified circumstances: Secondary | ICD-10-CM

## 2015-02-14 NOTE — Telephone Encounter (Signed)
[place]. Forms filled out as much as possible and forwarded to [place]. JG//CMA

## 2015-02-16 NOTE — Telephone Encounter (Signed)
[facility] JG//CMA

## 2015-03-17 ENCOUNTER — Ambulatory Visit (INDEPENDENT_AMBULATORY_CARE_PROVIDER_SITE_OTHER): Payer: BLUE CROSS/BLUE SHIELD | Admitting: Nurse Practitioner

## 2015-03-17 ENCOUNTER — Encounter: Payer: Self-pay | Admitting: *Deleted

## 2015-03-17 ENCOUNTER — Encounter: Payer: Self-pay | Admitting: Nurse Practitioner

## 2015-03-17 VITALS — BP 139/86 | HR 68 | Temp 97.8°F | Resp 16 | Ht 67.0 in | Wt 232.0 lb

## 2015-03-17 DIAGNOSIS — R10819 Abdominal tenderness, unspecified site: Secondary | ICD-10-CM | POA: Diagnosis not present

## 2015-03-17 DIAGNOSIS — R10816 Epigastric abdominal tenderness: Secondary | ICD-10-CM

## 2015-03-17 DIAGNOSIS — R112 Nausea with vomiting, unspecified: Secondary | ICD-10-CM

## 2015-03-17 LAB — POCT URINALYSIS DIPSTICK
Glucose, UA: NEGATIVE
Leukocytes, UA: NEGATIVE
Nitrite, UA: NEGATIVE
PH UA: 5.5
Protein, UA: 30
Urobilinogen, UA: 0.2

## 2015-03-17 LAB — POCT URINE PREGNANCY: Preg Test, Ur: NEGATIVE

## 2015-03-17 MED ORDER — ONDANSETRON 8 MG PO TBDP: 8.0000 mg | ORAL_TABLET | Freq: Two times a day (BID) | ORAL | Status: DC | PRN

## 2015-03-17 MED ORDER — PHENAZOPYRIDINE HCL 200 MG PO TABS: 200.0000 mg | ORAL_TABLET | Freq: Three times a day (TID) | ORAL | Status: DC | PRN

## 2015-03-17 MED ORDER — TRAMADOL HCL 50 MG PO TABS: 50.0000 mg | ORAL_TABLET | Freq: Three times a day (TID) | ORAL | Status: DC | PRN

## 2015-03-17 MED ORDER — NITROFURANTOIN MONOHYD MACRO 100 MG PO CAPS: 100.0000 mg | ORAL_CAPSULE | Freq: Two times a day (BID) | ORAL | Status: DC

## 2015-03-17 NOTE — Progress Notes (Signed)
Pre visit review using our clinic review tool, if applicable. No additional management support is needed unless otherwise documented below in the visit note. 

## 2015-03-17 NOTE — Progress Notes (Signed)
Subjective:     Kim Macdonald is a 43 y.o. female who presents for evaluation of abdominal pain and "cramping in chest". Onset was 3 days ago. Symptoms have been gradually worsening. The pain is described as aching, and is 3/10 in intensity. Pain is located in the suprapubic region & epigastric region without radiation.  Aggravating factors: none.  Alleviating factors: none-she tried TUMS without relief. Associated symptoms: flatus, nausea and vomiting X 3 yesterday. She is not eating or drinking today. The patient denies belching, constipation, diarrhea, dysuria, frequency, fever & back pain, SOB, palpitations, LE swelling, diaphoresis. She gives Hx of 3 endoscopies: thinks she was told she had an "ulcer". Last scope was 7 yrs ago. No record in chart.  The patient's history has been marked as reviewed and updated as appropriate.  Review of Systems Pertinent items are noted in HPI.     Objective:    BP 139/86 mmHg  Pulse 68  Temp(Src) 97.8 F (36.6 C) (Temporal)  Resp 16  Ht 5\' 7"  (1.702 m)  Wt 232 lb (105.235 kg)  BMI 36.33 kg/m2  SpO2 98%  LMP 03/03/2015 General appearance: alert, cooperative, appears stated age and no distress Eyes: negative findings: lids and lashes normal and conjunctivae and sclerae normal Throat: lips, mucosa, and tongue normal; teeth and gums normal Back: no CVA tenderness, but says she feels discomfort in anterior abdomen when tapping over flanks Lungs: clear to auscultation bilaterally Heart: regular rate and rhythm, S1, S2 normal, no murmur, click, rub or gallop Abdomen: normal findings: no masses palpable and no organomegaly and abnormal findings:  epigastric & suprapubic tenderness Neurologic: Grossly normal    Assessment:plan  1. Suprapubic tenderness DD: UTI, PID, diverticulitis - Urine culture - POCT urinalysis dipstick-pos bili, blood, SG 1.030 - POCT urine pregnancy-neg - nitrofurantoin, macrocrystal-monohydrate, (MACROBID) 100 MG capsule; Take 1  capsule (100 mg total) by mouth 2 (two) times daily.  Dispense: 14 capsule; Refill: 0 - phenazopyridine (PYRIDIUM) 200 MG tablet; Take 1 tablet (200 mg total) by mouth 3 (three) times daily as needed for pain.  Dispense: 6 tablet; Refill: 0 - traMADol (ULTRAM) 50 MG tablet; Take 1 tablet (50 mg total) by mouth every 8 (eight) hours as needed.  Dispense: 30 tablet; Refill: 0  2. Nausea and vomiting, vomiting of unspecified type - ondansetron (ZOFRAN-ODT) 8 MG disintegrating tablet; Take 1 tablet (8 mg total) by mouth 2 (two) times daily as needed for nausea or vomiting.  Dispense: 20 tablet; Refill: 0  3. Epigastric abdominal tenderness DD: PUD, gastritis, GERD Increase omeprazole to 40 mg qd Start probiotic Limit coffee, ETOH, over eating, fried foods  F/u 10 dys

## 2015-03-17 NOTE — Patient Instructions (Addendum)
It appears you have a urinary tract infection. I think your gastritis is flaring as well.  You may use tramadol if needed for pain & ondansetron for nausea.  For infection: Start antibiotic. Our office will call if we need to change the antibiotic. Take pyridium to relax bladder, caution: urine tears & sweat will be orange. Do not be alarmed!  Increase fluids (water, juice, colorless soda, decaff tea) as you are dehydrated: small sips, nibble popsicle, sip soup every hour.   For gastritis: increase omeprazole to twice daily for 1 month. Take probiotic daily for 3 months. Avoid fried foods, coffee, alcohol, and over eating.  Return in 10 days or sooner if not feeling better. Urinary Tract Infection Urinary tract infections (UTIs) can develop anywhere along your urinary tract. Your urinary tract is your body's drainage system for removing wastes and extra water. Your urinary tract includes two kidneys, two ureters, a bladder, and a urethra. Your kidneys are a pair of bean-shaped organs. Each kidney is about the size of your fist. They are located below your ribs, one on each side of your spine. CAUSES Infections are caused by microbes, which are microscopic organisms, including fungi, viruses, and bacteria. These organisms are so small that they can only be seen through a microscope. Bacteria are the microbes that most commonly cause UTIs. SYMPTOMS  Symptoms of UTIs may vary by age and gender of the patient and by the location of the infection. Symptoms in young women typically include a frequent and intense urge to urinate and a painful, burning feeling in the bladder or urethra during urination. Older women and men are more likely to be tired, shaky, and weak and have muscle aches and abdominal pain. A fever may mean the infection is in your kidneys. Other symptoms of a kidney infection include pain in your back or sides below the ribs, nausea, and vomiting. DIAGNOSIS To diagnose a UTI, your  caregiver will ask you about your symptoms. Your caregiver also will ask to provide a urine sample. The urine sample will be tested for bacteria and white blood cells. White blood cells are made by your body to help fight infection. TREATMENT  Typically, UTIs can be treated with medication. Because most UTIs are caused by a bacterial infection, they usually can be treated with the use of antibiotics. The choice of antibiotic and length of treatment depend on your symptoms and the type of bacteria causing your infection. HOME CARE INSTRUCTIONS  If you were prescribed antibiotics, take them exactly as your caregiver instructs you. Finish the medication even if you feel better after you have only taken some of the medication.  Drink enough water and fluids to keep your urine clear or pale yellow.  Avoid caffeine, tea, and carbonated beverages. They tend to irritate your bladder.  Empty your bladder often. Avoid holding urine for long periods of time.  Empty your bladder before and after sexual intercourse.  After a bowel movement, women should cleanse from front to back. Use each tissue only once. SEEK MEDICAL CARE IF:   You have back pain.  You develop a fever.  Your symptoms do not begin to resolve within 3 days. SEEK IMMEDIATE MEDICAL CARE IF:   You have severe back pain or lower abdominal pain.  You develop chills.  You have nausea or vomiting.  You have continued burning or discomfort with urination. MAKE SURE YOU:   Understand these instructions.  Will watch your condition.  Will get help right away  if you are not doing well or get worse. Document Released: 06/27/2005 Document Revised: 03/18/2012 Document Reviewed: 10/26/2011 [facility] Patient Information 2014 [place], [place].

## 2015-03-18 DIAGNOSIS — R10816 Epigastric abdominal tenderness: Secondary | ICD-10-CM | POA: Insufficient documentation

## 2015-03-18 DIAGNOSIS — R10819 Abdominal tenderness, unspecified site: Secondary | ICD-10-CM | POA: Insufficient documentation

## 2015-03-18 LAB — URINE CULTURE
Colony Count: NO GROWTH
Organism ID, Bacteria: NO GROWTH

## 2015-03-22 ENCOUNTER — Ambulatory Visit: Payer: BLUE CROSS/BLUE SHIELD | Admitting: Physician Assistant

## 2015-03-23 ENCOUNTER — Telehealth: Payer: Self-pay | Admitting: Physician Assistant

## 2015-03-23 ENCOUNTER — Ambulatory Visit (INDEPENDENT_AMBULATORY_CARE_PROVIDER_SITE_OTHER): Payer: BLUE CROSS/BLUE SHIELD | Admitting: Physician Assistant

## 2015-03-23 ENCOUNTER — Ambulatory Visit: Payer: BLUE CROSS/BLUE SHIELD | Admitting: Nurse Practitioner

## 2015-03-23 ENCOUNTER — Encounter: Payer: Self-pay | Admitting: Physician Assistant

## 2015-03-23 VITALS — BP 126/80 | HR 84 | Temp 98.1°F | Resp 16 | Ht 67.0 in | Wt 234.0 lb

## 2015-03-23 DIAGNOSIS — K219 Gastro-esophageal reflux disease without esophagitis: Secondary | ICD-10-CM

## 2015-03-23 DIAGNOSIS — R1032 Left lower quadrant pain: Secondary | ICD-10-CM | POA: Diagnosis not present

## 2015-03-23 DIAGNOSIS — R1012 Left upper quadrant pain: Secondary | ICD-10-CM | POA: Diagnosis not present

## 2015-03-23 LAB — COMPREHENSIVE METABOLIC PANEL
ALK PHOS: 81 U/L (ref 39–117)
ALT: 14 U/L (ref 0–35)
AST: 17 U/L (ref 0–37)
Albumin: 4 g/dL (ref 3.5–5.2)
BUN: 10 mg/dL (ref 6–23)
CALCIUM: 9.3 mg/dL (ref 8.4–10.5)
CHLORIDE: 102 meq/L (ref 96–112)
CO2: 25 mEq/L (ref 19–32)
Creatinine, Ser: 0.85 mg/dL (ref 0.40–1.20)
GFR: 93.66 mL/min (ref >60.00)
Glucose, Bld: 105 mg/dL — ABNORMAL HIGH (ref 70–99)
Potassium: 3.7 mEq/L (ref 3.5–5.1)
Sodium: 133 mEq/L — ABNORMAL LOW (ref 135–145)
Total Bilirubin: 0.6 mg/dL (ref 0.2–1.2)
Total Protein: 7.5 g/dL (ref 6.0–8.3)

## 2015-03-23 LAB — CBC WITH DIFFERENTIAL/PLATELET
BASOS ABS: 0 10*3/uL (ref 0.0–0.1)
BASOS PCT: 0.3 % (ref 0.0–3.0)
EOS ABS: 0.1 10*3/uL (ref 0.0–0.7)
Eosinophils Relative: 0.6 % (ref 0.0–5.0)
HCT: 39.8 % (ref 36.0–46.0)
Hemoglobin: 13.3 g/dL (ref 12.0–15.0)
LYMPHS PCT: 16.4 % (ref 12.0–46.0)
Lymphs Abs: 2.5 10*3/uL (ref 0.7–4.0)
MCHC: 33.3 g/dL (ref 30.0–36.0)
MCV: 95.7 fl (ref 78.0–100.0)
MONO ABS: 1.2 10*3/uL — AB (ref 0.1–1.0)
Monocytes Relative: 7.5 % (ref 3.0–12.0)
NEUTROS PCT: 75.2 % (ref 43.0–77.0)
Neutro Abs: 11.6 10*3/uL — ABNORMAL HIGH (ref 1.4–7.7)
Platelets: 290 10*3/uL (ref 150.0–400.0)
RBC: 4.16 Mil/uL (ref 3.87–5.11)
RDW: 13.9 % (ref 11.5–15.5)
WBC: 15.5 10*3/uL — AB (ref 4.0–10.5)

## 2015-03-23 LAB — URINALYSIS, ROUTINE W REFLEX MICROSCOPIC
Bilirubin Urine: NEGATIVE
Ketones, ur: NEGATIVE
Leukocytes, UA: NEGATIVE
NITRITE: NEGATIVE
SPECIFIC GRAVITY, URINE: 1.025 (ref 1.000–1.030)
TOTAL PROTEIN, URINE-UPE24: NEGATIVE
UROBILINOGEN UA: 0.2 (ref 0.0–1.0)
Urine Glucose: NEGATIVE
pH: 5.5 (ref 5.0–8.0)

## 2015-03-23 LAB — LIPASE: Lipase: 6 U/L — ABNORMAL LOW (ref 11.0–59.0)

## 2015-03-23 LAB — H. PYLORI ANTIBODY, IGG: H Pylori IgG: NEGATIVE

## 2015-03-23 MED ORDER — SUCRALFATE 1 GM/10ML PO SUSP: 1.0000 g | Freq: Three times a day (TID) | ORAL | Status: DC

## 2015-03-23 MED ORDER — HYDROCODONE-ACETAMINOPHEN 10-325 MG PO TABS
1.0000 | ORAL_TABLET | Freq: Three times a day (TID) | ORAL | Status: DC | PRN
Start: 2015-03-23 — End: 2015-05-24

## 2015-03-23 NOTE — Addendum Note (Signed)
Addended by: Silvio Pate D on: 03/23/2015 11:37 AM   Modules accepted: Orders

## 2015-03-23 NOTE — Progress Notes (Signed)
Patient presents to clinic today c/o LUQ and LLQ pain and tenderness with nausea and fatigue.  Endorses one episode of non- bloody emesis that has resolved. Denies fever, chills. Endorses good bowel output without tenesmus, melena or hematochezia. Denies urinary hesitancy, urgency or hematuria. Urine hgb positive at visit one week ago with another provider but urine preg and culture negative.  No micro was performed to adequately assess for hematuria. Patient does endorse acid reflux.  Has just restarted her Prilosec.  Past Medical History  Diagnosis Date  . GERD (gastroesophageal reflux disease)   . HA (headache)   . History of chicken pox   . Migraine     Current Outpatient Prescriptions on File Prior to Visit  Medication Sig Dispense Refill  . amitriptyline (ELAVIL) 10 MG tablet Take 1 tablet (10 mg total) by mouth at bedtime. 30 tablet 1  . aspirin 81 MG chewable tablet Chew 1 tablet (81 mg total) by mouth daily. 30 tablet 0  . nitrofurantoin, macrocrystal-monohydrate, (MACROBID) 100 MG capsule Take 1 capsule (100 mg total) by mouth 2 (two) times daily. 14 capsule 0  . omeprazole (PRILOSEC) 20 MG capsule Take 1 capsule (20 mg total) by mouth daily. 30 capsule 3  . ondansetron (ZOFRAN-ODT) 8 MG disintegrating tablet Take 1 tablet (8 mg total) by mouth 2 (two) times daily as needed for nausea or vomiting. 20 tablet 0  . traMADol (ULTRAM) 50 MG tablet Take 1 tablet (50 mg total) by mouth every 8 (eight) hours as needed. (Patient not taking: Reported on 03/23/2015) 30 tablet 0   No current facility-administered medications on file prior to visit.    No Known Allergies  Family History  Problem Relation Age of Onset  . Congestive Heart Failure Mother 67    Deceased  . Stroke Mother   . Stroke Brother     multiple [6]  . Hypertension Brother     x3  . Hypertension Sister     x4  . Diabetes Sister   . Diabetes Brother   . Diabetes Mother   . Diabetes Maternal Grandmother   .  Breast cancer Maternal Aunt   . Heart attack Maternal Uncle   . Healthy Son     x1  . Healthy Daughter     x2    History   Social History  . Marital Status: Single    Spouse Name: N/A  . Number of Children: N/A  . Years of Education: N/A   Social History Main Topics  . Smoking status: Never Smoker   . Smokeless tobacco: Never Used  . Alcohol Use: No     Comment: rare   . Drug Use: No  . Sexual Activity:    Partners: Male   Other Topics Concern  . None   Social History Narrative   Review of Systems - See HPI.  All other ROS are negative.  BP 126/80 mmHg  Pulse 84  Temp(Src) 98.1 F (36.7 C) (Oral)  Resp 16  Ht  (1.702 m)  Wt 234 lb (106.142 kg)  BMI 36.64 kg/m2  SpO2 98%  LMP 03/03/2015  Physical Exam  Constitutional: She is oriented to person, place, and time and well-developed, well-nourished, and in no distress.  HENT:  Head: Normocephalic and atraumatic.  Eyes: Conjunctivae are normal.  Neck: Neck supple.  Cardiovascular: Normal rate, regular rhythm, normal heart sounds and intact distal pulses.   Pulmonary/Chest: Effort normal and breath sounds normal. No respiratory distress. She has  no wheezes. She has no rales. She exhibits no tenderness.  Abdominal: Normal appearance and bowel sounds are normal. There is no hepatosplenomegaly. There is tenderness in the left upper quadrant and left lower quadrant. There is no CVA tenderness. No hernia.  Neurological: She is alert and oriented to person, place, and time.  Skin: Skin is warm and dry. No rash noted.  Psychiatric: Affect normal.  Vitals reviewed.   Recent Results (from the past 2160 hour(s))  ANA     Status: None   Collection Time: 01/17/15 11:21 AM  Result Value Ref Range   Anit Nuclear Antibody(ANA) NEG NEGATIVE  C3 and C4     Status: None   Collection Time: 01/17/15 11:21 AM  Result Value Ref Range   C3 Complement 155 90 - 180 mg/dL   C4 Complement 34 10 - 40 mg/dL  Sedimentation rate      Status: None   Collection Time: 01/17/15 11:21 AM  Result Value Ref Range   Sed Rate 12 0 - 20 mm/hr  C-reactive protein     Status: Abnormal   Collection Time: 01/17/15 11:21 AM  Result Value Ref Range   CRP 1.7 (H) <0.60 mg/dL  ANCA screen with reflex titer     Status: None   Collection Time: 01/17/15 11:21 AM  Result Value Ref Range   c-ANCA Screen NEGATIVE NEGATIVE   p-ANCA Screen NEGATIVE NEGATIVE   Atypical p-ANCA Screen NEGATIVE NEGATIVE    Comment: ANCA Screen includes evaluation for p-ANCA, c-ANCA and Atypical p-ANCA.   Cryoglobulin     Status: None   Collection Time: 01/17/15 11:21 AM  Result Value Ref Range   Cryoglobulin REPORT     Comment: Cryoglobulin, QL Analysis  None Detected                      Reference range:  None Detected   Antithrombin III     Status: None   Collection Time: 01/17/15 11:21 AM  Result Value Ref Range   AntiThromb III Func 85 76 - 126 %  Protein C, total     Status: None   Collection Time: 01/17/15 11:21 AM  Result Value Ref Range   Protein C, Total 105 72 - 160 %    Comment: ** Please note change in reference range(s). **  Protein S, total     Status: None   Collection Time: 01/17/15 11:21 AM  Result Value Ref Range   Protein S Total 119 60 - 150 %  Lupus anticoagulant panel     Status: None   Collection Time: 01/17/15 11:21 AM  Result Value Ref Range   PTT Lupus Anticoagulant 42.4 28.0 - 43.0 secs   PTTLA 4:1 Mix NOT APPL 28.0 - 43.0 secs   PTTLA Confirmation NOT APPL <8.0 secs   DRVVT 30.3 <42.9 secs   DRVVT 1:1 Mix NOT APPL <42.9 secs   Drvvt confirmation NOT APPL <1.15 Ratio   Lupus Anticoagulant NOT DETECTED NOT DETECTED  Phosphatidylserine IgG,IgA,IgM     Status: None   Collection Time: 01/17/15 11:21 AM  Result Value Ref Range   Phosphatidylserine IgG Autoantibodies 5 <16 U/mL   Phosphatydalserine, IgM 4 <22 U/mL   Phosphatydalserine, IgA 7 <20 U/mL    Comment:   The Antiphospholipid Syndrome (APS) is a  clinical-pathologic correlation that includes a clinical event (e.g. thrombosis, pregnancy loss, thrombocytopenia) and persistent positive Antiphospholipid Antibodies (IgM or IgG ACA >40 MPL / GPL, IgM or IgG anti-B2GPI  antibodies, or a Lupus Anticoagulant). The IgA isotype has been implicated in smaller studies, but has not yet been incorporated into the APS criteria. Reference Terese Door Haemost 2006: 4; 295   Beta-2 glycoprotein antibodies     Status: None   Collection Time: 01/17/15 11:21 AM  Result Value Ref Range   Beta-2 Glyco I IgG 8 <20 G Units   Beta-2-Glycoprotein I IgM 7 <20 M Units   Beta-2-Glycoprotein I IgA 4 <20 A Units  Cardiolipin antibodies, IgG, IgM, IgA     Status: None   Collection Time: 01/17/15 11:21 AM  Result Value Ref Range   Anticardiolipin IgG 8 <23 GPL U/mL   Anticardiolipin IgM 0 <11 MPL U/mL   Anticardiolipin IgA 0 <22 APL U/mL    Comment:   Reference Range:  Cardiolipin IgG    Normal                  <23    Low Positive (+)        23-35    Moderate Positive (+)   36-50    High Positive (+)       >50   Reference Range:  Cardiolipin IgM    Normal                  <11    Low Positive (+)        11-20    Moderate Positive (+)   21-30    High Positive (+)       >30    Reference Range:  Cardiolipin IgA    Normal                  <22    Low Positive (+)        22-35    Moderate Positive (+)   36-45    High Positive (+)       >45     Factor 5 leiden     Status: None   Collection Time: 01/17/15 11:21 AM  Result Value Ref Range   Result REPORT     Comment: FACTOR V LEIDEN (R506Q) MUTATION NOT DETECTED   Interpretation REPORT     Comment: This individual is negative (normal) for the Factor V Leiden (R506Q) mutation in the Factor V gene. Increased risk of thrombophilia can be caused by a variety of genetic and non-genetic factors not screened for by this assay.    Reviewer REPORT     Comment: Gillian Shields, Ph.D., [facility] Director,  Molecular Genetics SUPPLEMENTAL INFORMATION The Factor V Leiden (R506Q) mutation [[id number]:c. 1601G>A (p.R534Q)] in the Factor V gene is one of the most common causes of inherited thrombophilia.  This mutation causes resistance to degradation of activated Factor V protein by activated Protein C (APC). The Factor V Leiden (R506Q) mutation is detected by amplification of the selected region of Factor V gene by polymerase chain reaction (PCR) and fluorescent probe hybridization to the targeted region, followed by melting curve analysis with a real time PCR system. Although rare, false positive or false negative results may occur.  All results should be interpreted in context of clinical findings, relevant history, and other laboratory data. Health care providers, please contact your local [facility] genetic counselor or call [phone] for assistance with interpretation of these results. This test  was developed and its analytical performance characteristics have been determined by [employer], [place], [place]. It has not been cleared or approved by the  U.S. Food and Drug Administration. The FDA has determined that such clearance or approval is not necessary. This assay has been validated pursuant to [employer] and is used for clinical purposes.   Protein C activity     Status: Abnormal   Collection Time: 01/17/15 11:21 AM  Result Value Ref Range   Protein C Activity 164 (H) 75 - 133 %  Protein S activity     Status: Abnormal   Collection Time: 01/17/15 11:21 AM  Result Value Ref Range   Protein S Activity 147 (H) 69 - 129 %  Prothrombin gene mutation     Status: None   Collection Time: 01/17/15 11:21 AM  Result Value Ref Range   Result REPORT     Comment: THE G20210A MUTATION NOT DETECTED   Interpretation REPORT     Comment: This individual is negative (normal) for the G20210A mutation in the Prothrombin/Factor II gene.   Increased risk of thrombophilia can be caused by a variety of genetic and non-genetic factors not screened for by this assay.    Reviewer REPORT     Comment: W. Harlan Stains, Ph.D., [facility] Director, Molecular Genetics SUPPLEMENTAL INFORMATION The G20210A mutation [[id number]:g.21538G>A (c.*97G>A)] in the Prothrombin/Factor II gene is the second most common inherited risk factor for thrombosis occurring in approximately 2% of Caucasians.  Presence of the mutation is associated with an elevation of prothrombin levels to about 30% above normal in heterozygotes and to 70% above normal in homozygotes. The G20210A mutation is detected by amplification of the selected region of Factor II gene by polymerase chain reaction (PCR) and fluorescent probe hybridization to the targeted region, followed by melting curve analysis with a real time PCR system. Although rare, false positive or false negative results may occur.  All results should be interpreted in context of clinical findings, relevant history, and other laboratory data. Health care providers, please contact your local [facility] genetic counselor or call [phone]) for assistance with interpretation of these results. This test was developed and its analytical performance characteristics have been determined by [employer], [place], [place]. It has not been cleared or approved by the U.S. Food and Drug Administration. The FDA has determined that such clearance or approval is not necessary. This assay has been validated pursuant to [employer] and is used for clinical purposes.   Sjogrens syndrome-A extractable nuclear antibody     Status: None   Collection Time: 01/17/15 11:21 AM  Result Value Ref Range   SSA (Ro) (ENA) Antibody, IgG <1.0 NEG <1.0 NEG AI  CBC w/Diff     Status: None   Collection Time: 02/08/15 10:53 AM  Result Value Ref Range   WBC 10.3 4.0 - 10.5 K/uL     RBC 4.20 3.87 - 5.11 Mil/uL   Hemoglobin 13.5 12.0 - 15.0 g/dL   HCT 65.7 84.6 - 96.2 %   MCV 92.3 78.0 - 100.0 fl   MCHC 34.8 30.0 - 36.0 g/dL   RDW 95.2 84.1 - 32.4 %   Platelets 313.0 150.0 - 400.0 K/uL   Neutrophils Relative % 67.6 43.0 - 77.0 %   Lymphocytes Relative 23.4 12.0 - 46.0 %   Monocytes Relative 7.6 3.0 - 12.0 %   Eosinophils Relative 1.0 0.0 - 5.0 %   Basophils Relative 0.4 0.0 - 3.0 %   Neutro Abs 7.0 1.4 - 7.7 K/uL   Lymphs Abs 2.4 0.7 - 4.0 K/uL   Monocytes Absolute 0.8 0.1 - 1.0 K/uL  Eosinophils Absolute 0.1 0.0 - 0.7 K/uL   Basophils Absolute 0.0 0.0 - 0.1 K/uL  Basic Metabolic Panel (BMET)     Status: Abnormal   Collection Time: 02/08/15 10:53 AM  Result Value Ref Range   Sodium 136 135 - 145 mEq/L   Potassium 3.4 (L) 3.5 - 5.1 mEq/L   Chloride 104 96 - 112 mEq/L   CO2 28 19 - 32 mEq/L   Glucose, Bld 107 (H) 70 - 99 mg/dL   BUN 9 6 - 23 mg/dL   Creatinine, Ser 4.09 0.40 - 1.20 mg/dL   Calcium 9.5 8.4 - 81.1 mg/dL   GFR 91.47 >82.95 mL/min  POCT urinalysis dipstick     Status: None   Collection Time: 03/17/15  4:03 PM  Result Value Ref Range   Color, UA dark yellow    Clarity, UA clear    Glucose, UA negatiev    Bilirubin, UA small    Ketones, UA trace    Spec Grav, UA >=1.030    Blood, UA moderate    pH, UA 5.5    Protein, UA 30    Urobilinogen, UA 0.2    Nitrite, UA negative    Leukocytes, UA Negative Negative  POCT urine pregnancy     Status: None   Collection Time: 03/17/15  4:04 PM  Result Value Ref Range   Preg Test, Ur Negative Negative  Urine culture     Status: None   Collection Time: 03/17/15  4:16 PM  Result Value Ref Range   Colony Count NO GROWTH    Organism ID, Bacteria NO GROWTH     Assessment/Plan: LUQ pain With some mild LLQ pain.  Will check CT to further assess.  Giving reflux and pain, suspect gastritis as cause. Will check CBC, CMP, Lipase, H. Pylori.  Will check UA with micro to assess for true hematuria.   Patient to continue Prilosec and begin Carafate.  Norco given for pain relief until symptoms calm down.  Alarm signs/symptoms reviewed with patient.

## 2015-03-23 NOTE — Patient Instructions (Signed)
Please go to the lab for blood work.  Stop by the front desk to speak with Marj or Jen to schedule your CT Scan. Continue your acid reflux medications but begin the Carafate syrup as directed. Use Hydrocodone as directed for pain.  Stay well hydrated.  I will call you with all of your results and we will treat further based on findings. If symptoms acutely worsen, please call 911 or proceed to nearest ER.

## 2015-03-23 NOTE — Assessment & Plan Note (Signed)
With some mild LLQ pain.  Will check CT to further assess.  Giving reflux and pain, suspect gastritis as cause. Will check CBC, CMP, Lipase, H. Pylori.  Will check UA with micro to assess for true hematuria.  Patient to continue Prilosec and begin Carafate.  Norco given for pain relief until symptoms calm down.  Alarm signs/symptoms reviewed with patient.

## 2015-03-23 NOTE — Progress Notes (Signed)
Pre visit review using our clinic review tool, if applicable. No additional management support is needed unless otherwise documented below in the visit note. 

## 2015-03-23 NOTE — Telephone Encounter (Signed)
No charge. 

## 2015-03-23 NOTE — Telephone Encounter (Signed)
Pt was no show 03/22/15 7:45am, acute appt, pt was seen this morning, charge?

## 2015-03-24 ENCOUNTER — Ambulatory Visit (HOSPITAL_BASED_OUTPATIENT_CLINIC_OR_DEPARTMENT_OTHER)
Admission: RE | Admit: 2015-03-24 | Discharge: 2015-03-24 | Disposition: A | Discharge status: Home / Self Care | Payer: BLUE CROSS/BLUE SHIELD | Source: Ambulatory Visit | Attending: Physician Assistant | Admitting: Physician Assistant

## 2015-03-24 DIAGNOSIS — R1012 Left upper quadrant pain: Secondary | ICD-10-CM | POA: Diagnosis present

## 2015-03-24 DIAGNOSIS — R1900 Intra-abdominal and pelvic swelling, mass and lump, unspecified site: Secondary | ICD-10-CM | POA: Insufficient documentation

## 2015-03-24 DIAGNOSIS — R1032 Left lower quadrant pain: Secondary | ICD-10-CM

## 2015-03-25 ENCOUNTER — Telehealth: Payer: Self-pay | Admitting: Physician Assistant

## 2015-03-25 NOTE — Telephone Encounter (Signed)
-----   Message from [name], PA-C sent at 03/25/2015  9:09 AM EDT ----- US reveals she has fibroids (benign tumors) on her uterus.  Doubtful these are cause of pain.  [place] does not reveal evident cause of symptoms.  Her WBC count was elevated slightly.  Want her to return on Monday for reassessment and repeat CBC. Rest and hydrate well.  Continue pain medication.

## 2015-03-25 NOTE — Telephone Encounter (Signed)
Spoke with the pt and informed her of recent labs and [place] results and note.  Pt verbalized understanding and agreed.  Pt was scheduled an appt for Monday.//AB/CMA

## 2015-03-25 NOTE — Telephone Encounter (Signed)
Relation to pt: self  Call back number: [phone] Pharmacy:   Reason for call:  Pt would inquiring about lab results and CT results

## 2015-03-28 ENCOUNTER — Ambulatory Visit (INDEPENDENT_AMBULATORY_CARE_PROVIDER_SITE_OTHER): Payer: BLUE CROSS/BLUE SHIELD | Admitting: Physician Assistant

## 2015-03-28 ENCOUNTER — Encounter: Payer: Self-pay | Admitting: Physician Assistant

## 2015-03-28 VITALS — BP 133/80 | HR 73 | Temp 98.0°F | Ht 67.0 in | Wt 235.8 lb

## 2015-03-28 DIAGNOSIS — D72829 Elevated white blood cell count, unspecified: Secondary | ICD-10-CM | POA: Diagnosis not present

## 2015-03-28 DIAGNOSIS — K2970 Gastritis, unspecified, without bleeding: Secondary | ICD-10-CM | POA: Diagnosis not present

## 2015-03-28 LAB — CBC WITH DIFFERENTIAL/PLATELET
Basophils Absolute: 0 10*3/uL (ref 0.0–0.1)
Basophils Relative: 0.3 % (ref 0.0–3.0)
Eosinophils Absolute: 0.1 10*3/uL (ref 0.0–0.7)
Eosinophils Relative: 0.8 % (ref 0.0–5.0)
HCT: 37.8 % (ref 36.0–46.0)
HEMOGLOBIN: 12.7 g/dL (ref 12.0–15.0)
LYMPHS PCT: 17.4 % (ref 12.0–46.0)
Lymphs Abs: 2.2 10*3/uL (ref 0.7–4.0)
MCHC: 33.7 g/dL (ref 30.0–36.0)
MCV: 95.2 fl (ref 78.0–100.0)
MONO ABS: 0.9 10*3/uL (ref 0.1–1.0)
MONOS PCT: 7 % (ref 3.0–12.0)
NEUTROS PCT: 74.5 % (ref 43.0–77.0)
Neutro Abs: 9.2 10*3/uL — ABNORMAL HIGH (ref 1.4–7.7)
PLATELETS: 274 10*3/uL (ref 150.0–400.0)
RBC: 3.97 Mil/uL (ref 3.87–5.11)
RDW: 14.2 % (ref 11.5–15.5)
WBC: 12.4 10*3/uL — ABNORMAL HIGH (ref 4.0–10.5)

## 2015-03-28 MED ORDER — SUCRALFATE 1 G PO TABS: 1.0000 g | ORAL_TABLET | Freq: Two times a day (BID) | ORAL | Status: DC

## 2015-03-28 NOTE — Progress Notes (Signed)
Patient presents to clinic today for follow-up of LUQ pain. Labs and CT abdomen unremarkable except for mild leukocytosis at 15.6.  Patient restarted on Omeprazole and Carafate was added. IS taking Omeprazole but could not afford Carafate suspension.  Endorses improvement in heart burn and LUQ pain.  Still present but much milder.  Denies nausea, vomiting, tenesmus, melena or hematochezia.  Endorses goo dehydration and diet.  Endorses good bowel output.  Past Medical History  Diagnosis Date  . GERD (gastroesophageal reflux disease)   . HA (headache)   . History of chicken pox   . Migraine     Current Outpatient Prescriptions on File Prior to Visit  Medication Sig Dispense Refill  . amitriptyline (ELAVIL) 10 MG tablet Take 1 tablet (10 mg total) by mouth at bedtime. 30 tablet 1  . aspirin 81 MG chewable tablet Chew 1 tablet (81 mg total) by mouth daily. 30 tablet 0  . omeprazole (PRILOSEC) 20 MG capsule Take 1 capsule (20 mg total) by mouth daily. 30 capsule 3  . ondansetron (ZOFRAN-ODT) 8 MG disintegrating tablet Take 1 tablet (8 mg total) by mouth 2 (two) times daily as needed for nausea or vomiting. 20 tablet 0  . HYDROcodone-acetaminophen (NORCO) 10-325 MG per tablet Take 1 tablet by mouth every 8 (eight) hours as needed. (Patient not taking: Reported on 03/28/2015) 90 tablet 0   No current facility-administered medications on file prior to visit.    No Known Allergies  Family History  Problem Relation Age of Onset  . Congestive Heart Failure Mother 20    Deceased  . Stroke Mother   . Stroke Brother     multiple [6]  . Hypertension Brother     x3  . Hypertension Sister     x4  . Diabetes Sister   . Diabetes Brother   . Diabetes Mother   . Diabetes Maternal Grandmother   . Breast cancer Maternal Aunt   . Heart attack Maternal Uncle   . Healthy Son     x1  . Healthy Daughter     x2    History   Social History  . Marital Status: Single    Spouse Name: N/A  .  Number of Children: N/A  . Years of Education: N/A   Social History Main Topics  . Smoking status: Never Smoker   . Smokeless tobacco: Never Used  . Alcohol Use: No     Comment: rare   . Drug Use: No  . Sexual Activity:    Partners: Male   Other Topics Concern  . None   Social History Narrative   Review of Systems - See HPI.  All other ROS are negative.  BP 133/80 mmHg  Pulse 73  Temp(Src) 98 F (36.7 C) (Oral)  Ht $R'5\' 7"'uz$  (1.702 m)  Wt 235 lb 12.8 oz (106.958 kg)  BMI 36.92 kg/m2  SpO2 100%  LMP 03/03/2015  Physical Exam  Constitutional: She is oriented to person, place, and time and well-developed, well-nourished, and in no distress.  HENT:  Head: Normocephalic and atraumatic.  Eyes: Conjunctivae are normal.  Neck: Neck supple.  Cardiovascular: Normal rate, regular rhythm, normal heart sounds and intact distal pulses.   Pulmonary/Chest: Effort normal and breath sounds normal. No respiratory distress. She has no wheezes. She has no rales. She exhibits no tenderness.  Abdominal: Soft. Bowel sounds are normal. She exhibits no distension and no mass. There is no tenderness. There is no rebound and no guarding.  Neurological:  She is alert and oriented to person, place, and time.  Skin: Skin is warm and dry. No rash noted.  Psychiatric: Affect normal.  Vitals reviewed.   Recent Results (from the past 2160 hour(s))  ANA     Status: None   Collection Time: 01/17/15 11:21 AM  Result Value Ref Range   Anit Nuclear Antibody(ANA) NEG NEGATIVE  C3 and C4     Status: None   Collection Time: 01/17/15 11:21 AM  Result Value Ref Range   C3 Complement 155 90 - 180 mg/dL   C4 Complement 34 10 - 40 mg/dL  Sedimentation rate     Status: None   Collection Time: 01/17/15 11:21 AM  Result Value Ref Range   Sed Rate 12 0 - 20 mm/hr  C-reactive protein     Status: Abnormal   Collection Time: 01/17/15 11:21 AM  Result Value Ref Range   CRP 1.7 (H) <0.60 mg/dL  ANCA screen with  reflex titer     Status: None   Collection Time: 01/17/15 11:21 AM  Result Value Ref Range   c-ANCA Screen NEGATIVE NEGATIVE   p-ANCA Screen NEGATIVE NEGATIVE   Atypical p-ANCA Screen NEGATIVE NEGATIVE    Comment: ANCA Screen includes evaluation for p-ANCA, c-ANCA and Atypical p-ANCA.   Cryoglobulin     Status: None   Collection Time: 01/17/15 11:21 AM  Result Value Ref Range   Cryoglobulin REPORT     Comment: Cryoglobulin, QL Analysis  None Detected                      Reference range:  None Detected   Antithrombin III     Status: None   Collection Time: 01/17/15 11:21 AM  Result Value Ref Range   AntiThromb III Func 85 76 - 126 %  Protein C, total     Status: None   Collection Time: 01/17/15 11:21 AM  Result Value Ref Range   Protein C, Total 105 72 - 160 %    Comment: ** Please note change in reference range(s). **  Protein S, total     Status: None   Collection Time: 01/17/15 11:21 AM  Result Value Ref Range   Protein S Total 119 60 - 150 %  Lupus anticoagulant panel     Status: None   Collection Time: 01/17/15 11:21 AM  Result Value Ref Range   PTT Lupus Anticoagulant 42.4 28.0 - 43.0 secs   PTTLA 4:1 Mix NOT APPL 28.0 - 43.0 secs   PTTLA Confirmation NOT APPL <8.0 secs   DRVVT 30.3 <42.9 secs   DRVVT 1:1 Mix NOT APPL <42.9 secs   Drvvt confirmation NOT APPL <1.15 Ratio   Lupus Anticoagulant NOT DETECTED NOT DETECTED  Phosphatidylserine IgG,IgA,IgM     Status: None   Collection Time: 01/17/15 11:21 AM  Result Value Ref Range   Phosphatidylserine IgG Autoantibodies 5 <16 U/mL   Phosphatydalserine, IgM 4 <22 U/mL   Phosphatydalserine, IgA 7 <20 U/mL    Comment:   The Antiphospholipid Syndrome (APS) is a clinical-pathologic correlation that includes a clinical event (e.g. thrombosis, pregnancy loss, thrombocytopenia) and persistent positive Antiphospholipid Antibodies (IgM or IgG ACA >40 MPL / GPL, IgM or IgG anti-B2GPI antibodies, or a Lupus Anticoagulant). The  IgA isotype has been implicated in smaller studies, but has not yet been incorporated into the APS criteria. Reference Terese Door Haemost 2006: 4; 295   Beta-2 glycoprotein antibodies     Status: None  Collection Time: 01/17/15 11:21 AM  Result Value Ref Range   Beta-2 Glyco I IgG 8 <20 G Units   Beta-2-Glycoprotein I IgM 7 <20 M Units   Beta-2-Glycoprotein I IgA 4 <20 A Units  Cardiolipin antibodies, IgG, IgM, IgA     Status: None   Collection Time: 01/17/15 11:21 AM  Result Value Ref Range   Anticardiolipin IgG 8 <23 GPL U/mL   Anticardiolipin IgM 0 <11 MPL U/mL   Anticardiolipin IgA 0 <22 APL U/mL    Comment:   Reference Range:  Cardiolipin IgG    Normal                  <23    Low Positive (+)        23-35    Moderate Positive (+)   36-50    High Positive (+)       >50   Reference Range:  Cardiolipin IgM    Normal                  <11    Low Positive (+)        11-20    Moderate Positive (+)   21-30    High Positive (+)       >30    Reference Range:  Cardiolipin IgA    Normal                  <22    Low Positive (+)        22-35    Moderate Positive (+)   36-45    High Positive (+)       >45     Factor 5 leiden     Status: None   Collection Time: 01/17/15 11:21 AM  Result Value Ref Range   Result REPORT     Comment: FACTOR V LEIDEN (R506Q) MUTATION NOT DETECTED   Interpretation REPORT     Comment: This individual is negative (normal) for the Factor V Leiden (R506Q) mutation in the Factor V gene. Increased risk of thrombophilia can be caused by a variety of genetic and non-genetic factors not screened for by this assay.    Reviewer REPORT     Comment: Gillian Shields, Ph.D., [facility] Director, Molecular Genetics SUPPLEMENTAL INFORMATION The Factor V Leiden (R506Q) mutation [[id number]:c. 1601G>A (p.R534Q)] in the Factor V gene is one of the most common causes of inherited thrombophilia.  This mutation causes resistance to degradation of activated Factor V  protein by activated Protein C (APC). The Factor V Leiden (R506Q) mutation is detected by amplification of the selected region of Factor V gene by polymerase chain reaction (PCR) and fluorescent probe hybridization to the targeted region, followed by melting curve analysis with a real time PCR system. Although rare, false positive or false negative results may occur.  All results should be interpreted in context of clinical findings, relevant history, and other laboratory data. Health care providers, please contact your local [facility] genetic counselor or call [phone] for assistance with interpretation of these results. This test  was developed and its analytical performance characteristics have been determined by [employer], [place], [place]. It has not been cleared or approved by the U.S. Food and Drug Administration. The FDA has determined that such clearance or approval is not necessary. This assay has been validated pursuant to [employer] and is used for clinical purposes.   Protein C activity     Status: Abnormal  Collection Time: 01/17/15 11:21 AM  Result Value Ref Range   Protein C Activity 164 (H) 75 - 133 %  Protein S activity     Status: Abnormal   Collection Time: 01/17/15 11:21 AM  Result Value Ref Range   Protein S Activity 147 (H) 69 - 129 %  Prothrombin gene mutation     Status: None   Collection Time: 01/17/15 11:21 AM  Result Value Ref Range   Result REPORT     Comment: THE G20210A MUTATION NOT DETECTED   Interpretation REPORT     Comment: This individual is negative (normal) for the G20210A mutation in the Prothrombin/Factor II gene.  Increased risk of thrombophilia can be caused by a variety of genetic and non-genetic factors not screened for by this assay.    Reviewer REPORT     Comment: W. Gailen Shelter, Ph.D., [facility] Director, Molecular Genetics SUPPLEMENTAL INFORMATION The G20210A  mutation [[id number]:g.21538G>A (c.*97G>A)] in the Prothrombin/Factor II gene is the second most common inherited risk factor for thrombosis occurring in approximately 2% of Caucasians.  Presence of the mutation is associated with an elevation of prothrombin levels to about 30% above normal in heterozygotes and to 70% above normal in homozygotes. The G20210A mutation is detected by amplification of the selected region of Factor II gene by polymerase chain reaction (PCR) and fluorescent probe hybridization to the targeted region, followed by melting curve analysis with a real time PCR system. Although rare, false positive or false negative results may occur.  All results should be interpreted in context of clinical findings, relevant history, and other laboratory data. Health care providers, please contact your local [facility] genetic counselor or call [phone]) for assistance with interpretation of these results. This test was developed and its analytical performance characteristics have been determined by [employer], [place], [place]. It has not been cleared or approved by the U.S. Food and Drug Administration. The FDA has determined that such clearance or approval is not necessary. This assay has been validated pursuant to [employer] and is used for clinical purposes.   Sjogrens syndrome-A extractable nuclear antibody     Status: None   Collection Time: 01/17/15 11:21 AM  Result Value Ref Range   SSA (Ro) (ENA) Antibody, IgG <1.0 NEG <1.0 NEG AI  CBC w/Diff     Status: None   Collection Time: 02/08/15 10:53 AM  Result Value Ref Range   WBC 10.3 4.0 - 10.5 K/uL   RBC 4.20 3.87 - 5.11 Mil/uL   Hemoglobin 13.5 12.0 - 15.0 g/dL   HCT 38.8 36.0 - 46.0 %   MCV 92.3 78.0 - 100.0 fl   MCHC 34.8 30.0 - 36.0 g/dL   RDW 13.9 11.5 - 15.5 %   Platelets 313.0 150.0 - 400.0 K/uL   Neutrophils Relative % 67.6 43.0 - 77.0 %    Lymphocytes Relative 23.4 12.0 - 46.0 %   Monocytes Relative 7.6 3.0 - 12.0 %   Eosinophils Relative 1.0 0.0 - 5.0 %   Basophils Relative 0.4 0.0 - 3.0 %   Neutro Abs 7.0 1.4 - 7.7 K/uL   Lymphs Abs 2.4 0.7 - 4.0 K/uL   Monocytes Absolute 0.8 0.1 - 1.0 K/uL   Eosinophils Absolute 0.1 0.0 - 0.7 K/uL   Basophils Absolute 0.0 0.0 - 0.1 K/uL  Basic Metabolic Panel (BMET)     Status: Abnormal   Collection Time: 02/08/15 10:53 AM  Result Value Ref Range   Sodium 136 135 -  145 mEq/L   Potassium 3.4 (L) 3.5 - 5.1 mEq/L   Chloride 104 96 - 112 mEq/L   CO2 28 19 - 32 mEq/L   Glucose, Bld 107 (H) 70 - 99 mg/dL   BUN 9 6 - 23 mg/dL   Creatinine, Ser 0.93 0.40 - 1.20 mg/dL   Calcium 9.5 8.4 - 10.5 mg/dL   GFR 84.47 >60.00 mL/min  POCT urinalysis dipstick     Status: None   Collection Time: 03/17/15  4:03 PM  Result Value Ref Range   Color, UA dark yellow    Clarity, UA clear    Glucose, UA negatiev    Bilirubin, UA small    Ketones, UA trace    Spec Grav, UA >=1.030    Blood, UA moderate    pH, UA 5.5    Protein, UA 30    Urobilinogen, UA 0.2    Nitrite, UA negative    Leukocytes, UA Negative Negative  POCT urine pregnancy     Status: None   Collection Time: 03/17/15  4:04 PM  Result Value Ref Range   Preg Test, Ur Negative Negative  Urine culture     Status: None   Collection Time: 03/17/15  4:16 PM  Result Value Ref Range   Colony Count NO GROWTH    Organism ID, Bacteria NO GROWTH   CBC w/Diff     Status: Abnormal   Collection Time: 03/23/15 10:05 AM  Result Value Ref Range   WBC 15.5 (H) 4.0 - 10.5 K/uL   RBC 4.16 3.87 - 5.11 Mil/uL   Hemoglobin 13.3 12.0 - 15.0 g/dL   HCT 39.8 36.0 - 46.0 %   MCV 95.7 78.0 - 100.0 fl   MCHC 33.3 30.0 - 36.0 g/dL   RDW 13.9 11.5 - 15.5 %   Platelets 290.0 150.0 - 400.0 K/uL   Neutrophils Relative % 75.2 43.0 - 77.0 %   Lymphocytes Relative 16.4 12.0 - 46.0 %   Monocytes Relative 7.5 3.0 - 12.0 %   Eosinophils Relative 0.6 0.0 - 5.0 %     Basophils Relative 0.3 0.0 - 3.0 %   Neutro Abs 11.6 (H) 1.4 - 7.7 K/uL   Lymphs Abs 2.5 0.7 - 4.0 K/uL   Monocytes Absolute 1.2 (H) 0.1 - 1.0 K/uL   Eosinophils Absolute 0.1 0.0 - 0.7 K/uL   Basophils Absolute 0.0 0.0 - 0.1 K/uL  Comp Met (CMET)     Status: Abnormal   Collection Time: 03/23/15 10:05 AM  Result Value Ref Range   Sodium 133 (L) 135 - 145 mEq/L   Potassium 3.7 3.5 - 5.1 mEq/L   Chloride 102 96 - 112 mEq/L   CO2 25 19 - 32 mEq/L   Glucose, Bld 105 (H) 70 - 99 mg/dL   BUN 10 6 - 23 mg/dL   Creatinine, Ser 0.85 0.40 - 1.20 mg/dL   Total Bilirubin 0.6 0.2 - 1.2 mg/dL   Alkaline Phosphatase 81 39 - 117 U/L   AST 17 0 - 37 U/L   ALT 14 0 - 35 U/L   Total Protein 7.5 6.0 - 8.3 g/dL   Albumin 4.0 3.5 - 5.2 g/dL   Calcium 9.3 8.4 - 10.5 mg/dL   GFR 93.66 >60.00 mL/min  Lipase     Status: Abnormal   Collection Time: 03/23/15 10:05 AM  Result Value Ref Range   Lipase 6.0 (L) 11.0 - 59.0 U/L  H. pylori antibody, IgG     Status: None  Collection Time: 03/23/15 10:05 AM  Result Value Ref Range   H Pylori IgG Negative Negative  Urinalysis, Routine w reflex microscopic (not at [facility])     Status: Abnormal   Collection Time: 03/23/15 11:37 AM  Result Value Ref Range   Color, Urine YELLOW Yellow;Lt. Yellow   APPearance CLEAR Clear   Specific Gravity, Urine 1.025 1.000-1.030   pH 5.5 5.0 - 8.0   Total Protein, Urine NEGATIVE Negative   Urine Glucose NEGATIVE Negative   Ketones, ur NEGATIVE Negative   Bilirubin Urine NEGATIVE Negative   Hgb urine dipstick MODERATE (A) Negative   Urobilinogen, UA 0.2 0.0 - 1.0   Leukocytes, UA NEGATIVE Negative   Nitrite NEGATIVE Negative   WBC, UA 0-2/hpf 0-2/hpf   RBC / HPF 0-2/hpf 0-2/hpf   Mucus, UA Presence of (A) None   Squamous Epithelial / LPF Rare(0-4/hpf) Rare(0-4/hpf)    Assessment/Plan: Gastritis Again CT negative.  Improving with resumption of PPI. H. Pylori negative. Rx generic Sucralfate tablets to be much more  affordable than suspension.  Leukocytosis Will repeat CBC today to further assess.

## 2015-03-28 NOTE — Progress Notes (Signed)
Pre visit review using our clinic review tool, if applicable. No additional management support is needed unless otherwise documented below in the visit note. 

## 2015-03-28 NOTE — Assessment & Plan Note (Signed)
Will repeat CBC today to further assess.

## 2015-03-28 NOTE — Assessment & Plan Note (Signed)
Again CT negative.  Improving with resumption of PPI. H. Pylori negative. Rx generic Sucralfate tablets to be much more affordable than suspension.

## 2015-03-28 NOTE — Patient Instructions (Signed)
Please pick up the new prescription of the sucralfate to take twice daily.  Use the coupon given today.  Continue your prilosec.  Stop by the lab for blood work. I will call you with your results.  I will call you once I have completed and faxed your paperwork.

## 2015-03-31 ENCOUNTER — Telehealth: Payer: Self-pay | Admitting: Physician Assistant

## 2015-03-31 NOTE — Telephone Encounter (Signed)
Relation to pt: self  Call back number: [phone]   Reason for call:   Pt checking the status of short term disability and FMLA paperwork. Pt states PA filled out paperwork at the time of visit 03/28/15. Advised pt turn around time  5-7 bussiness day. Pt would like a follow up call when paperwork is completed.

## 2015-03-31 NOTE — Telephone Encounter (Signed)
With provider 

## 2015-04-05 NOTE — Telephone Encounter (Signed)
Pt calling to see if paperwork was sent in yet. Please call her back [phone].

## 2015-04-06 NOTE — Telephone Encounter (Signed)
Forms were faxed July 1st

## 2015-04-08 ENCOUNTER — Encounter: Payer: Self-pay | Admitting: Physician Assistant

## 2015-04-18 ENCOUNTER — Ambulatory Visit: Payer: BLUE CROSS/BLUE SHIELD | Admitting: Neurology

## 2015-05-17 ENCOUNTER — Telehealth: Payer: Self-pay | Admitting: Physician Assistant

## 2015-05-17 ENCOUNTER — Ambulatory Visit: Payer: BLUE CROSS/BLUE SHIELD | Admitting: Physician Assistant

## 2015-05-17 NOTE — Telephone Encounter (Signed)
Charge. 

## 2015-05-17 NOTE — Telephone Encounter (Signed)
Pt no show today 05/17/15 10:30 am - 8/16 8:04am pt left VM to cancel appt no reason given - it took [place] a while to figure out the pt because very low tones and very little information - looks like 2nd no show. Charge?

## 2015-05-24 ENCOUNTER — Encounter: Payer: Self-pay | Admitting: Physician Assistant

## 2015-05-24 ENCOUNTER — Ambulatory Visit (INDEPENDENT_AMBULATORY_CARE_PROVIDER_SITE_OTHER): Payer: BLUE CROSS/BLUE SHIELD | Admitting: Physician Assistant

## 2015-05-24 VITALS — BP 126/80 | HR 78 | Temp 98.2°F | Resp 16 | Ht 67.0 in | Wt 239.1 lb

## 2015-05-24 DIAGNOSIS — G500 Trigeminal neuralgia: Secondary | ICD-10-CM | POA: Diagnosis not present

## 2015-05-24 MED ORDER — HYDROCODONE-ACETAMINOPHEN 10-325 MG PO TABS: 1.0000 | ORAL_TABLET | Freq: Three times a day (TID) | ORAL | Status: DC | PRN

## 2015-05-24 MED ORDER — CARBAMAZEPINE 200 MG PO TABS: 200.0000 mg | ORAL_TABLET | Freq: Two times a day (BID) | ORAL | Status: DC

## 2015-05-24 NOTE — Progress Notes (Signed)
Pre visit review using our clinic review tool, if applicable. No additional management support is needed unless otherwise documented below in the visit note/SLS  

## 2015-05-24 NOTE — Assessment & Plan Note (Signed)
Fits diagnostic criteria per UTD guidelines. Is followed by Neurology. Rx Tegretol 200 mg BID. Patient to continue pain medication as directed, if needed. Follow-up with Neurology -- encouraged to schedule appointment within 10 days.

## 2015-05-24 NOTE — Patient Instructions (Signed)
Please start the Tegretol twice daily as directed for Trigeminal Neuralgia. Please continue pain medication as directed if needed for severe pain. Please schedule a follow-up visit with your Neurologist.

## 2015-05-24 NOTE — Progress Notes (Signed)
Patient presents to clinic today c/o sharp pain with tingling of left face and forehead over the past week. Pain is intermittent and comes in waves. Denies trauma or injury. Denies headache, vision changes, rash or other skin changes. Denies AMS.  Past Medical History  Diagnosis Date  . GERD (gastroesophageal reflux disease)   . HA (headache)   . History of chicken pox   . Migraine     Current Outpatient Prescriptions on File Prior to Visit  Medication Sig Dispense Refill  . amitriptyline (ELAVIL) 10 MG tablet Take 1 tablet (10 mg total) by mouth at bedtime. 30 tablet 1  . aspirin 81 MG chewable tablet Chew 1 tablet (81 mg total) by mouth daily. 30 tablet 0  . omeprazole (PRILOSEC) 20 MG capsule Take 1 capsule (20 mg total) by mouth daily. 30 capsule 3  . ondansetron (ZOFRAN-ODT) 8 MG disintegrating tablet Take 1 tablet (8 mg total) by mouth 2 (two) times daily as needed for nausea or vomiting. 20 tablet 0  . sucralfate (CARAFATE) 1 G tablet Take 1 tablet (1 g total) by mouth 2 (two) times daily. 40 tablet 0   No current facility-administered medications on file prior to visit.    No Known Allergies  Family History  Problem Relation Age of Onset  . Congestive Heart Failure Mother 56    Deceased  . Stroke Mother   . Stroke Brother     multiple [6]  . Hypertension Brother     x3  . Hypertension Sister     x4  . Diabetes Sister   . Diabetes Brother   . Diabetes Mother   . Diabetes Maternal Grandmother   . Breast cancer Maternal Aunt   . Heart attack Maternal Uncle   . Healthy Son     x1  . Healthy Daughter     x2    Social History   Social History  . Marital Status: Single    Spouse Name: N/A  . Number of Children: N/A  . Years of Education: N/A   Social History Main Topics  . Smoking status: Never Smoker   . Smokeless tobacco: Never Used  . Alcohol Use: No     Comment: rare   . Drug Use: No  . Sexual Activity:    Partners: Male   Other Topics Concern   . None   Social History Narrative    Review of Systems - See HPI.  All other ROS are negative.  BP 126/80 mmHg  Pulse 78  Temp(Src) 98.2 F (36.8 C) (Oral)  Resp 16  Ht 5\' 7"  (1.702 m)  Wt 239 lb 2 oz (108.466 kg)  BMI 37.44 kg/m2  SpO2 98%  LMP 04/05/2015  Physical Exam  Constitutional: She is oriented to person, place, and time and well-developed, well-nourished, and in no distress.  HENT:  Head: Normocephalic and atraumatic.  Right Ear: External ear normal.  Left Ear: External ear normal.  Nose: Nose normal.  Mouth/Throat: Oropharynx is clear and moist. No oropharyngeal exudate.  TM within normal limits bilaterally.  Eyes: Conjunctivae and EOM are normal. Pupils are equal, round, and reactive to light.  Cardiovascular: Normal rate, regular rhythm, normal heart sounds and intact distal pulses.   Pulmonary/Chest: Effort normal and breath sounds normal. No respiratory distress. She has no wheezes. She has no rales. She exhibits no tenderness.  Neurological: She is alert and oriented to person, place, and time. She has normal sensation and intact cranial nerves. She  displays facial symmetry. No cranial nerve deficit or sensory deficit. She has a normal Straight Leg Raise Test and a normal Cerebellar Exam.  Psychiatric: Affect normal.  Vitals reviewed.   Recent Results (from the past 2160 hour(s))  POCT urinalysis dipstick     Status: None   Collection Time: 03/17/15  4:03 PM  Result Value Ref Range   Color, UA dark yellow    Clarity, UA clear    Glucose, UA negatiev    Bilirubin, UA small    Ketones, UA trace    Spec Grav, UA >=1.030    Blood, UA moderate    pH, UA 5.5    Protein, UA 30    Urobilinogen, UA 0.2    Nitrite, UA negative    Leukocytes, UA Negative Negative  POCT urine pregnancy     Status: None   Collection Time: 03/17/15  4:04 PM  Result Value Ref Range   Preg Test, Ur Negative Negative  Urine culture     Status: None   Collection Time: 03/17/15   4:16 PM  Result Value Ref Range   Colony Count NO GROWTH    Organism ID, Bacteria NO GROWTH   CBC w/Diff     Status: Abnormal   Collection Time: 03/23/15 10:05 AM  Result Value Ref Range   WBC 15.5 (H) 4.0 - 10.5 K/uL   RBC 4.16 3.87 - 5.11 Mil/uL   Hemoglobin 13.3 12.0 - 15.0 g/dL   HCT 59.9 08.7 - 43.1 %   MCV 95.7 78.0 - 100.0 fl   MCHC 33.3 30.0 - 36.0 g/dL   RDW 00.0 43.8 - 28.9 %   Platelets 290.0 150.0 - 400.0 K/uL   Neutrophils Relative % 75.2 43.0 - 77.0 %   Lymphocytes Relative 16.4 12.0 - 46.0 %   Monocytes Relative 7.5 3.0 - 12.0 %   Eosinophils Relative 0.6 0.0 - 5.0 %   Basophils Relative 0.3 0.0 - 3.0 %   Neutro Abs 11.6 (H) 1.4 - 7.7 K/uL   Lymphs Abs 2.5 0.7 - 4.0 K/uL   Monocytes Absolute 1.2 (H) 0.1 - 1.0 K/uL   Eosinophils Absolute 0.1 0.0 - 0.7 K/uL   Basophils Absolute 0.0 0.0 - 0.1 K/uL  Comp Met (CMET)     Status: Abnormal   Collection Time: 03/23/15 10:05 AM  Result Value Ref Range   Sodium 133 (L) 135 - 145 mEq/L   Potassium 3.7 3.5 - 5.1 mEq/L   Chloride 102 96 - 112 mEq/L   CO2 25 19 - 32 mEq/L   Glucose, Bld 105 (H) 70 - 99 mg/dL   BUN 10 6 - 23 mg/dL   Creatinine, Ser 6.66 0.40 - 1.20 mg/dL   Total Bilirubin 0.6 0.2 - 1.2 mg/dL   Alkaline Phosphatase 81 39 - 117 U/L   AST 17 0 - 37 U/L   ALT 14 0 - 35 U/L   Total Protein 7.5 6.0 - 8.3 g/dL   Albumin 4.0 3.5 - 5.2 g/dL   Calcium 9.3 8.4 - 27.0 mg/dL   GFR 37.57 >07.70 mL/min  Lipase     Status: Abnormal   Collection Time: 03/23/15 10:05 AM  Result Value Ref Range   Lipase 6.0 (L) 11.0 - 59.0 U/L  H. pylori antibody, IgG     Status: None   Collection Time: 03/23/15 10:05 AM  Result Value Ref Range   H Pylori IgG Negative Negative  Urinalysis, Routine w reflex microscopic (not at [facility])  Status: Abnormal   Collection Time: 03/23/15 11:37 AM  Result Value Ref Range   Color, Urine YELLOW Yellow;Lt. Yellow   APPearance CLEAR Clear   Specific Gravity, Urine 1.025 1.000-1.030   pH 5.5  5.0 - 8.0   Total Protein, Urine NEGATIVE Negative   Urine Glucose NEGATIVE Negative   Ketones, ur NEGATIVE Negative   Bilirubin Urine NEGATIVE Negative   Hgb urine dipstick MODERATE (A) Negative   Urobilinogen, UA 0.2 0.0 - 1.0   Leukocytes, UA NEGATIVE Negative   Nitrite NEGATIVE Negative   WBC, UA 0-2/hpf 0-2/hpf   RBC / HPF 0-2/hpf 0-2/hpf   Mucus, UA Presence of (A) None   Squamous Epithelial / LPF Rare(0-4/hpf) Rare(0-4/hpf)  CBC w/Diff     Status: Abnormal   Collection Time: 03/28/15  2:01 PM  Result Value Ref Range   WBC 12.4 (H) 4.0 - 10.5 K/uL   RBC 3.97 3.87 - 5.11 Mil/uL   Hemoglobin 12.7 12.0 - 15.0 g/dL   HCT 37.8 36.0 - 46.0 %   MCV 95.2 78.0 - 100.0 fl   MCHC 33.7 30.0 - 36.0 g/dL   RDW 14.2 11.5 - 15.5 %   Platelets 274.0 150.0 - 400.0 K/uL   Neutrophils Relative % 74.5 43.0 - 77.0 %   Lymphocytes Relative 17.4 12.0 - 46.0 %   Monocytes Relative 7.0 3.0 - 12.0 %   Eosinophils Relative 0.8 0.0 - 5.0 %   Basophils Relative 0.3 0.0 - 3.0 %   Neutro Abs 9.2 (H) 1.4 - 7.7 K/uL   Lymphs Abs 2.2 0.7 - 4.0 K/uL   Monocytes Absolute 0.9 0.1 - 1.0 K/uL   Eosinophils Absolute 0.1 0.0 - 0.7 K/uL   Basophils Absolute 0.0 0.0 - 0.1 K/uL    Assessment/Plan: Trigeminal neuralgia of left side of face Fits diagnostic criteria per UTD guidelines. Is followed by Neurology. Rx Tegretol 200 mg BID. Patient to continue pain medication as directed, if needed. Follow-up with Neurology -- encouraged to schedule appointment within 10 days.

## 2015-05-26 ENCOUNTER — Telehealth: Payer: Self-pay | Admitting: *Deleted

## 2015-05-26 NOTE — Telephone Encounter (Signed)
Received FMLA forms via fax from [employer] to extend current FMLA leave. Filled out as much as possible and forwarded to [place].

## 2015-05-30 ENCOUNTER — Telehealth: Payer: Self-pay | Admitting: Physician Assistant

## 2015-05-30 NOTE — Telephone Encounter (Signed)
Error

## 2015-05-30 NOTE — Telephone Encounter (Signed)
Pt called in stating that this paper work has to be faxed in by tomorrow.   Please advise.   CB#: [phone]

## 2015-05-30 NOTE — Telephone Encounter (Signed)
Spoke with patient regarding this. Will extend FMLA to cover for missed days due to "spells" but will not extend leave from work. Handled and faxed in.

## 2015-05-31 NOTE — Telephone Encounter (Signed)
Forms sent for scanning. JG//CMA 

## 2015-06-14 ENCOUNTER — Telehealth: Payer: Self-pay | Admitting: Physician Assistant

## 2015-06-14 NOTE — Telephone Encounter (Signed)
Pt calling to discuss info submitted FMLA paperwork. Requesting call from [place]. Please call at [phone].

## 2015-06-15 ENCOUNTER — Emergency Department (HOSPITAL_BASED_OUTPATIENT_CLINIC_OR_DEPARTMENT_OTHER)
Admission: EM | Admit: 2015-06-15 | Discharge: 2015-06-15 | Disposition: A | Discharge status: Home / Self Care | Payer: BLUE CROSS/BLUE SHIELD | Attending: Emergency Medicine | Admitting: Emergency Medicine

## 2015-06-15 ENCOUNTER — Encounter (HOSPITAL_BASED_OUTPATIENT_CLINIC_OR_DEPARTMENT_OTHER): Payer: Self-pay | Admitting: Emergency Medicine

## 2015-06-15 DIAGNOSIS — G43909 Migraine, unspecified, not intractable, without status migrainosus: Secondary | ICD-10-CM | POA: Insufficient documentation

## 2015-06-15 DIAGNOSIS — Z79899 Other long term (current) drug therapy: Secondary | ICD-10-CM | POA: Diagnosis not present

## 2015-06-15 DIAGNOSIS — Z8619 Personal history of other infectious and parasitic diseases: Secondary | ICD-10-CM | POA: Insufficient documentation

## 2015-06-15 DIAGNOSIS — M5412 Radiculopathy, cervical region: Secondary | ICD-10-CM | POA: Diagnosis not present

## 2015-06-15 DIAGNOSIS — Z7982 Long term (current) use of aspirin: Secondary | ICD-10-CM | POA: Diagnosis not present

## 2015-06-15 DIAGNOSIS — K219 Gastro-esophageal reflux disease without esophagitis: Secondary | ICD-10-CM | POA: Insufficient documentation

## 2015-06-15 DIAGNOSIS — M542 Cervicalgia: Secondary | ICD-10-CM | POA: Diagnosis present

## 2015-06-15 DIAGNOSIS — R202 Paresthesia of skin: Secondary | ICD-10-CM | POA: Diagnosis not present

## 2015-06-15 MED ORDER — BUPIVACAINE-EPINEPHRINE (PF) 0.5% -1:200000 IJ SOLN
1.8000 mL | Freq: Once | INTRAMUSCULAR | Status: AC
  Administered 2015-06-15: 1.8 mL
  Filled 2015-06-15: qty 1.8

## 2015-06-15 MED ORDER — ONDANSETRON 8 MG PO TBDP: 8.0000 mg | ORAL_TABLET | Freq: Three times a day (TID) | ORAL | Status: DC | PRN

## 2015-06-15 MED ORDER — OXYCODONE-ACETAMINOPHEN 5-325 MG PO TABS: 1.0000 | ORAL_TABLET | Freq: Four times a day (QID) | ORAL | Status: DC | PRN

## 2015-06-15 NOTE — ED Notes (Signed)
Upper back pain started 3 days ago. Now complaining of headache. Denies Nausea and vomiting.

## 2015-06-15 NOTE — Discharge Instructions (Signed)

## 2015-06-15 NOTE — ED Provider Notes (Signed)
CSN: [id number]     Arrival date & time 06/15/15  0154 History   First MD Initiated Contact with Patient 06/15/15 0356     Chief Complaint  Patient presents with  . Headache     (Consider location/radiation/quality/duration/timing/severity/associated sxs/prior Treatment) HPI  This is a 44 year old female with a three-day history of pain in the left side of her neck. She rates the pain as a 9 out of 10. The pain is dull and radiates to her upper back as well as to the left side of her head. The pain in her head is not like her usual migraine headaches. Pain is worsened with movement of the neck. Pain is not worsened with palpation of the scalp. She denies nausea, vomiting or photophobia. She has not had a fever. She has had some tingling in her left hand but cannot localize it to any particular dermatome.  Past Medical History  Diagnosis Date  . GERD (gastroesophageal reflux disease)   . HA (headache)   . History of chicken pox   . Migraine    Past Surgical History  Procedure Laterality Date  . No past surgeries     Family History  Problem Relation Age of Onset  . Congestive Heart Failure Mother 49    Deceased  . Stroke Mother   . Stroke Brother     multiple [6]  . Hypertension Brother     x3  . Hypertension Sister     x4  . Diabetes Sister   . Diabetes Brother   . Diabetes Mother   . Diabetes Maternal Grandmother   . Breast cancer Maternal Aunt   . Heart attack Maternal Uncle   . Healthy Son     x1  . Healthy Daughter     x2   Social History  Substance Use Topics  . Smoking status: Never Smoker   . Smokeless tobacco: Never Used  . Alcohol Use: No     Comment: rare    OB History    No data available     Review of Systems  All other systems reviewed and are negative.   Allergies  Vicodin  Home Medications   Prior to Admission medications   Medication Sig Start Date End Date Taking? Authorizing Provider  amitriptyline (ELAVIL) 10 MG tablet Take 1  tablet (10 mg total) by mouth at bedtime. 12/31/14   [name], PA-C  aspirin 81 MG chewable tablet Chew 1 tablet (81 mg total) by mouth daily. 11/28/14   Teniqua Pisciotta, PA-C  carbamazepine (TEGRETOL) 200 MG tablet Take 1 tablet (200 mg total) by mouth 2 (two) times daily. 05/24/15   [name], PA-C  HYDROcodone-acetaminophen (NORCO) 10-325 MG per tablet Take 1 tablet by mouth every 8 (eight) hours as needed. 05/24/15   [name], PA-C  omeprazole (PRILOSEC) 20 MG capsule Take 1 capsule (20 mg total) by mouth daily. 02/08/15   [name], PA-C  ondansetron (ZOFRAN-ODT) 8 MG disintegrating tablet Take 1 tablet (8 mg total) by mouth 2 (two) times daily as needed for nausea or vomiting. 03/17/15   Kelle Darting, NP  sucralfate (CARAFATE) 1 G tablet Take 1 tablet (1 g total) by mouth 2 (two) times daily. 03/28/15   [name], PA-C   BP 153/99 mmHg  Pulse 78  Temp(Src) 98.4 F (36.9 C) (Oral)  Resp 16  Ht  (1.702 m)  Wt 230 lb (104.327 kg)  BMI 36.01 kg/m2  SpO2 100%  LMP 04/05/2015   Physical Exam  General: Well-developed, well-nourished female in no acute distress; appearance consistent with age of record HENT: normocephalic; atraumatic Eyes: pupils equal, round and reactive to light; extraocular muscles intact Neck: supple; tenderness left posterolateral neck; no meningeal signs Heart: regular rate and rhythm Lungs: clear to auscultation bilaterally Abdomen: soft; nondistended Extremities: No deformity; full range of motion Neurologic: Awake, alert and oriented; motor function intact in all extremities and symmetric; no facial droop Skin: Warm and dry Psychiatric: Normal mood and affect    ED Course  Procedures (including critical care time)  OCCIPITAL BLOCK 1.8 milliliters of 0.5% bupivacaine with epinephrine were injected into the soft tissue of the posterior neck to the left of the superior to the vertebra prominens. The patient tolerated this  well and there were no immediate complications.  MDM  Patient's history and exam are consistent with a cervical radiculopathy. She will follow up with her primary care physician. We will provide analgesia in the meantime.    Paula Libra, MD 06/15/15 [phone]

## 2015-06-15 NOTE — Telephone Encounter (Signed)
Please call patient. I denied extending her FMLA last time as she was taken out until mid September by work and I had no reason for that to be extended. Did fill out intermittent leave for flare ups though. Maybe she has a question regarding that.

## 2015-06-15 NOTE — Telephone Encounter (Signed)
Spoke with patient, she states she has FMLA covered for time period out of work and also for intermittent [flare-ups]; she reported ED visit 06/14/15 for Cervical radiculopathy and has f/u appointment scheduled with Ramon Dredge in office tomorrow, 06/16/15; informed patient I would make provider aware/SLS

## 2015-06-16 ENCOUNTER — Encounter: Payer: Self-pay | Admitting: Medical

## 2015-06-16 ENCOUNTER — Ambulatory Visit (INDEPENDENT_AMBULATORY_CARE_PROVIDER_SITE_OTHER): Payer: BLUE CROSS/BLUE SHIELD | Admitting: Medical

## 2015-06-16 VITALS — BP 120/80 | HR 77 | Temp 97.9°F | Resp 16 | Ht 67.0 in | Wt 239.0 lb

## 2015-06-16 DIAGNOSIS — N912 Amenorrhea, unspecified: Secondary | ICD-10-CM

## 2015-06-16 DIAGNOSIS — M542 Cervicalgia: Secondary | ICD-10-CM

## 2015-06-16 DIAGNOSIS — R51 Headache: Secondary | ICD-10-CM | POA: Diagnosis not present

## 2015-06-16 DIAGNOSIS — R519 Headache, unspecified: Secondary | ICD-10-CM

## 2015-06-16 LAB — POCT URINE PREGNANCY: PREG TEST UR: NEGATIVE

## 2015-06-16 MED ORDER — KETOROLAC TROMETHAMINE 60 MG/2ML IM SOLN
60.0000 mg | Freq: Once | INTRAMUSCULAR | Status: AC
  Administered 2015-06-16: 60 mg via INTRAMUSCULAR

## 2015-06-16 MED ORDER — TIZANIDINE HCL 6 MG PO CAPS: 6.0000 mg | ORAL_CAPSULE | Freq: Three times a day (TID) | ORAL | Status: DC | PRN

## 2015-06-16 MED ORDER — MELOXICAM 15 MG PO TABS: 15.0000 mg | ORAL_TABLET | Freq: Every day | ORAL | Status: DC

## 2015-06-16 NOTE — Progress Notes (Signed)
Subjective:    Patient ID: Kim Macdonald, female    DOB: 07-May-1971, 44 y.o.   MRN: [id number]  HPI  Pt in with recurrent HA. She states for April has headaches. Pt has seen neurologist around May or June.   Pt had mri in march. She had finding per readiology report  " scattered, punctate foci of cerebral white matter T2 signal abnormality, nonspecific. These may reflect the sequelae of migraines or mild chronic small vessel ischemia, with other considerations including sequelae of trauma, hypercoagulable state, vasculitis, prior infection or demyelination "  Pt did see Dr. Everlena Cooper. Per pt her gave her elavil. Helped briefly but now not helping much at all. Pt is taking oxycodone for severe ha in the past. But she states can't take this due to getting very nausea.  Pt seen by ED the other day. The evaluated her . Below is from [facility] ED.  "This is a 44 year old female with a three-day history of pain in the left side of her neck. She rates the pain as a 9 out of 10. The pain is dull and radiates to her upper back as well as to the left side of her head. The pain in her head is not like her usual migraine headaches. Pain is worsened with movement of the neck  ."  ED did give her injection and it made her feel worse.  LMP- April 05, 2015.     Review of Systems  Constitutional: Negative for fever, chills, diaphoresis, activity change and fatigue.  Respiratory: Negative for cough, chest tightness and shortness of breath.   Cardiovascular: Negative for chest pain, palpitations and leg swelling.  Gastrointestinal: Negative for nausea, vomiting and abdominal pain.  Musculoskeletal: Positive for neck pain. Negative for neck stiffness.       Mostly neck pain that radiates to left side head.  Neurological: Positive for headaches. Negative for dizziness, tremors, seizures, syncope, facial asymmetry, speech difficulty, weakness, light-headedness and numbness.       Mostly neck pain.    Hematological: Negative for adenopathy. Does not bruise/bleed easily.  Psychiatric/Behavioral: Negative for behavioral problems, confusion and agitation. The patient is not nervous/anxious.      Past Medical History  Diagnosis Date  . GERD (gastroesophageal reflux disease)   . HA (headache)   . History of chicken pox   . Migraine     Social History   Social History  . Marital Status: Single    Spouse Name: N/A  . Number of Children: N/A  . Years of Education: N/A   Occupational History  . Not on file.   Social History Main Topics  . Smoking status: Never Smoker   . Smokeless tobacco: Never Used  . Alcohol Use: No     Comment: rare   . Drug Use: No  . Sexual Activity:    Partners: Male   Other Topics Concern  . Not on file   Social History Narrative    Past Surgical History  Procedure Laterality Date  . No past surgeries      Family History  Problem Relation Age of Onset  . Congestive Heart Failure Mother 88    Deceased  . Stroke Mother   . Stroke Brother     multiple [6]  . Hypertension Brother     x3  . Hypertension Sister     x4  . Diabetes Sister   . Diabetes Brother   . Diabetes Mother   . Diabetes Maternal Grandmother   .  Breast cancer Maternal Aunt   . Heart attack Maternal Uncle   . Healthy Son     x1  . Healthy Daughter     x2    Allergies  Allergen Reactions  . Vicodin [Hydrocodone-Acetaminophen] Nausea And Vomiting    Current Outpatient Prescriptions on File Prior to Visit  Medication Sig Dispense Refill  . amitriptyline (ELAVIL) 10 MG tablet Take 1 tablet (10 mg total) by mouth at bedtime. 30 tablet 1  . aspirin 81 MG chewable tablet Chew 1 tablet (81 mg total) by mouth daily. 30 tablet 0  . carbamazepine (TEGRETOL) 200 MG tablet Take 1 tablet (200 mg total) by mouth 2 (two) times daily. 60 tablet 0  . omeprazole (PRILOSEC) 20 MG capsule Take 1 capsule (20 mg total) by mouth daily. 30 capsule 3  . ondansetron (ZOFRAN ODT) 8 MG  disintegrating tablet Take 1 tablet (8 mg total) by mouth every 8 (eight) hours as needed for nausea or vomiting. 10 tablet 0  . oxyCODONE-acetaminophen (PERCOCET) 5-325 MG per tablet Take 1-2 tablets by mouth every 6 (six) hours as needed (for pain). 20 tablet 0  . sucralfate (CARAFATE) 1 G tablet Take 1 tablet (1 g total) by mouth 2 (two) times daily. 40 tablet 0   No current facility-administered medications on file prior to visit.    BP 120/80 mmHg  Pulse 77  Temp(Src) 97.9 F (36.6 C) (Oral)  Resp 16  Ht  (1.702 m)  Wt 239 lb (108.41 kg)  BMI 37.42 kg/m2  SpO2 98%  LMP 04/05/2015       Objective:   Physical Exam  General Mental Status- Alert. General Appearance- Not in acute distress.   Skin General: Color- Normal Color. Moisture- Normal Moisture.  Neck Carotid Arteries- Normal color. Moisture- Normal Moisture. No carotid bruits. No JVD. No nuccal rididity but point tender area left upper trapezius region just below insertion into occiptial region.  Chest and Lung Exam Auscultation: Breath Sounds:-Normal.  Cardiovascular Auscultation:Rythm- Regular. Murmurs & Other Heart Sounds:Auscultation of the heart reveals- No Murmurs.  Abdomen Inspection:-Inspeection Normal. Palpation/Percussion:Note:No mass. Palpation and Percussion of the abdomen reveal- Non Tender, Non Distended + BS, no rebound or guarding.    Neurologic Cranial Nerve exam:- CN III-XII intact(No nystagmus), symmetric smile. Drift Test:- No drift. Romberg Exam:- Negative.  Heal to Toe Gait exam:-Normal. Finger to Nose:- Normal/Intact Strength:- 5/5 equal and symmetric strength both upper and lower extremities.      Assessment & Plan:  We gave toradol 60 mg im.  Rx meloxicam. Start tomorrow.  Zanaflex rx.(Rx advisement given)  Refer back to Dr. Everlena Cooper.  If neck pain worsens, or severe ha with neuro signs or symptoms as discussed then ED evaluation.  Follow up this coming Monday or  as needed

## 2015-06-16 NOTE — Progress Notes (Signed)
Pre visit review using our clinic review tool, if applicable. No additional management support is needed unless otherwise documented below in the visit note. 

## 2015-06-16 NOTE — Patient Instructions (Signed)
We gave toradol 60 mg im.  Rx meloxicam. Start tomorrow.  Zanaflex rx.  Refer back to Dr. Everlena Cooper.  If neck pain worsens, or severe ha with neuro signs or symptoms as discussed then ED evaluation.  Follow up this coming Monday or as needed

## 2015-06-16 NOTE — Addendum Note (Signed)
Addended by: Neldon Labella on: 06/16/2015 09:17 AM   Modules accepted: Orders

## 2015-06-16 NOTE — Telephone Encounter (Signed)
Noted  

## 2015-06-16 NOTE — Addendum Note (Signed)
Addended by: Neldon Labella on: 06/16/2015 10:19 AM   Modules accepted: Orders

## 2015-06-20 ENCOUNTER — Telehealth: Payer: Self-pay | Admitting: Physician Assistant

## 2015-06-20 NOTE — Telephone Encounter (Signed)
Pt called back in to discuss prior conversation. She says that she was told by her job that she has to have a work note also stating the days that she's missed. ALSO, [place] says that the proper changes for her FMLA paperwork can be made in section [place]. So that they will go ahead and file her short term disability.   Her first day of absence was on 9/12. Pt says that they need to know the first day that she will be returning to work.    CB#: [phone]

## 2015-06-20 NOTE — Telephone Encounter (Signed)
Please call to assess days she missed since I did not evaluate her.

## 2015-06-20 NOTE — Telephone Encounter (Signed)
Would be better for pt to see pcp/[place]. Looks prior [employer] form filled out. Sounds like she wants changes to that. I have only seen her one time.

## 2015-06-20 NOTE — Telephone Encounter (Signed)
Patient states she missed 9/12-9/16.

## 2015-06-20 NOTE — Telephone Encounter (Signed)
Called patient at CB#: [phone] left message for return call.

## 2015-06-20 NOTE — Telephone Encounter (Signed)
Edward please advise if I need to follow up with you or send to [place].

## 2015-06-20 NOTE — Telephone Encounter (Signed)
I would recommend she file for short-term disability until Neurology gets this straightened out. If she has new FMLA paperwork sent I can addend it to cover her absence last week in addition to episodic coverage, but again short-term disability may be appropriate so we can take her out until Neuro gets involved and she can still get paid somewhat.

## 2015-06-20 NOTE — Telephone Encounter (Signed)
Will see at appointment tomorrow and discuss further.

## 2015-06-20 NOTE — Telephone Encounter (Signed)
Pt called stating headache is still going. She said that the meds given 9/15 are not helping her. She said she cannot take the oxycodone given by the hospital. She hasn't worked since last Monday. She said before that she was going to work but they sent her home because of the headaches. She said she doesn't know what to do. She is wondering if this would be disability. She said that FMLA changed and 2 episodes per week and she has been out of work for a whole week. Pt wanting to know what to do to get rid of headaches and what to do for disability.  Please call [phone]  Pt saw Ramon Dredge 06/16/15 PCP is Selena Batten

## 2015-06-21 ENCOUNTER — Encounter: Payer: Self-pay | Admitting: Physician Assistant

## 2015-06-21 ENCOUNTER — Ambulatory Visit (HOSPITAL_BASED_OUTPATIENT_CLINIC_OR_DEPARTMENT_OTHER)
Admission: RE | Admit: 2015-06-21 | Discharge: 2015-06-21 | Disposition: A | Discharge status: Home / Self Care | Payer: BLUE CROSS/BLUE SHIELD | Source: Ambulatory Visit | Attending: Physician Assistant | Admitting: Physician Assistant

## 2015-06-21 ENCOUNTER — Ambulatory Visit (INDEPENDENT_AMBULATORY_CARE_PROVIDER_SITE_OTHER): Payer: BLUE CROSS/BLUE SHIELD | Admitting: Physician Assistant

## 2015-06-21 VITALS — BP 140/89 | HR 78 | Temp 97.5°F | Resp 16 | Ht 67.0 in | Wt 238.0 lb

## 2015-06-21 DIAGNOSIS — M47892 Other spondylosis, cervical region: Secondary | ICD-10-CM | POA: Diagnosis not present

## 2015-06-21 DIAGNOSIS — R519 Headache, unspecified: Secondary | ICD-10-CM

## 2015-06-21 DIAGNOSIS — R51 Headache: Secondary | ICD-10-CM | POA: Diagnosis not present

## 2015-06-21 DIAGNOSIS — R52 Pain, unspecified: Secondary | ICD-10-CM | POA: Insufficient documentation

## 2015-06-21 DIAGNOSIS — G500 Trigeminal neuralgia: Secondary | ICD-10-CM

## 2015-06-21 DIAGNOSIS — M5032 Other cervical disc degeneration, mid-cervical region: Secondary | ICD-10-CM | POA: Diagnosis not present

## 2015-06-21 MED ORDER — OXYCODONE-ACETAMINOPHEN 5-325 MG PO TABS: 1.0000 | ORAL_TABLET | Freq: Four times a day (QID) | ORAL | Status: DC | PRN

## 2015-06-21 MED ORDER — CARBAMAZEPINE 200 MG PO TABS: 400.0000 mg | ORAL_TABLET | Freq: Two times a day (BID) | ORAL | Status: DC

## 2015-06-21 NOTE — Patient Instructions (Signed)
Please go downstairs for imaging. I will call with results. Please continue Percocet and Elavil.  Start the new dose of the Tegretol for neuralgia symptoms since current dose is helping some.  I will handle your STD paperwork and FMLA.  I have written a letter taking you out of work until Dr. Everlena Cooper sees you. STD will be in effect from the last day of work once it is completed so you can get your money.

## 2015-06-21 NOTE — Progress Notes (Signed)
Patient presents to clinic today c/o continued intermittent left sided headaches occuring daily. Denies worsening of headache or neuro symptoms. Notes neck pain is present now. Denies trauma or injury. Denies radiation of pain. Is taking Tegretol for her trigeminal neuralgia with some relief of headache as well but symptoms still brak through even with use of Percocet. Has upcoming appointment scheduled with Neurology. Workup thus far has been unremarkable. Was out of work last week and as such her FMLA paperwork needs addending. Is asking about recommendations regarding short-term disability.   Past Medical History  Diagnosis Date  . GERD (gastroesophageal reflux disease)   . HA (headache)   . History of chicken pox   . Migraine     Current Outpatient Prescriptions on File Prior to Visit  Medication Sig Dispense Refill  . amitriptyline (ELAVIL) 10 MG tablet Take 1 tablet (10 mg total) by mouth at bedtime. 30 tablet 1  . aspirin 81 MG chewable tablet Chew 1 tablet (81 mg total) by mouth daily. 30 tablet 0  . meloxicam (MOBIC) 15 MG tablet Take 1 tablet (15 mg total) by mouth daily. 15 tablet 0  . omeprazole (PRILOSEC) 20 MG capsule Take 1 capsule (20 mg total) by mouth daily. 30 capsule 3  . ondansetron (ZOFRAN ODT) 8 MG disintegrating tablet Take 1 tablet (8 mg total) by mouth every 8 (eight) hours as needed for nausea or vomiting. 10 tablet 0  . sucralfate (CARAFATE) 1 G tablet Take 1 tablet (1 g total) by mouth 2 (two) times daily. 40 tablet 0  . tizanidine (ZANAFLEX) 6 MG capsule Take 1 capsule (6 mg total) by mouth 3 (three) times daily as needed for muscle spasms. 12 capsule 0   No current facility-administered medications on file prior to visit.    Allergies  Allergen Reactions  . Vicodin [Hydrocodone-Acetaminophen] Nausea And Vomiting    Family History  Problem Relation Age of Onset  . Congestive Heart Failure Mother 11    Deceased  . Stroke Mother   . Stroke Brother       multiple [6]  . Hypertension Brother     x3  . Hypertension Sister     x4  . Diabetes Sister   . Diabetes Brother   . Diabetes Mother   . Diabetes Maternal Grandmother   . Breast cancer Maternal Aunt   . Heart attack Maternal Uncle   . Healthy Son     x1  . Healthy Daughter     x2    Social History   Social History  . Marital Status: Single    Spouse Name: N/A  . Number of Children: N/A  . Years of Education: N/A   Social History Main Topics  . Smoking status: Never Smoker   . Smokeless tobacco: Never Used  . Alcohol Use: No     Comment: rare   . Drug Use: No  . Sexual Activity:    Partners: Male   Other Topics Concern  . None   Social History Narrative    Review of Systems - See HPI.  All other ROS are negative.  BP 140/89 mmHg  Pulse 78  Temp(Src) 97.5 F (36.4 C) (Oral)  Resp 16  Ht  (1.702 m)  Wt 238 lb (107.956 kg)  BMI 37.27 kg/m2  SpO2 100%  LMP 04/05/2015  Physical Exam  Constitutional: She is oriented to person, place, and time and well-developed, well-nourished, and in no distress.  HENT:  Head: Normocephalic  and atraumatic.  Eyes: Conjunctivae are normal.  Neck: Neck supple.  Cardiovascular: Normal rate, regular rhythm, normal heart sounds and intact distal pulses.   Pulmonary/Chest: Effort normal and breath sounds normal. No respiratory distress. She has no wheezes. She has no rales. She exhibits no tenderness.  Neurological: She is alert and oriented to person, place, and time. She has normal sensation, normal strength, normal reflexes and intact cranial nerves. She displays facial symmetry. Gait normal. Gait normal.  Skin: Skin is warm and dry. No rash noted.  Psychiatric: Affect normal.  Vitals reviewed.   Recent Results (from the past 2160 hour(s))  CBC w/Diff     Status: Abnormal   Collection Time: 03/28/15  2:01 PM  Result Value Ref Range   WBC 12.4 (H) 4.0 - 10.5 K/uL   RBC 3.97 3.87 - 5.11 Mil/uL   Hemoglobin 12.7  12.0 - 15.0 g/dL   HCT 16.1 09.6 - 04.5 %   MCV 95.2 78.0 - 100.0 fl   MCHC 33.7 30.0 - 36.0 g/dL   RDW 40.9 81.1 - 91.4 %   Platelets 274.0 150.0 - 400.0 K/uL   Neutrophils Relative % 74.5 43.0 - 77.0 %   Lymphocytes Relative 17.4 12.0 - 46.0 %   Monocytes Relative 7.0 3.0 - 12.0 %   Eosinophils Relative 0.8 0.0 - 5.0 %   Basophils Relative 0.3 0.0 - 3.0 %   Neutro Abs 9.2 (H) 1.4 - 7.7 K/uL   Lymphs Abs 2.2 0.7 - 4.0 K/uL   Monocytes Absolute 0.9 0.1 - 1.0 K/uL   Eosinophils Absolute 0.1 0.0 - 0.7 K/uL   Basophils Absolute 0.0 0.0 - 0.1 K/uL  POCT urine pregnancy     Status: None   Collection Time: 06/16/15 10:19 AM  Result Value Ref Range   Preg Test, Ur Negative Negative    Assessment/Plan: Frequent headaches Will obtain dg cervical spine giving new symptoms. Will proceed with MRI if indicated. Percocet refilled. Tegretol increased to 400 mg BID to help with nerve related neck pain and TGN. Follow-up with Neurology as scheduled.  Trigeminal neuralgia of left side of face Increase Tegretol to 400 mg BID.

## 2015-06-22 DIAGNOSIS — G500 Trigeminal neuralgia: Secondary | ICD-10-CM

## 2015-06-22 MED ORDER — METHYLPREDNISOLONE ACETATE 80 MG/ML IJ SUSP
80.0000 mg | Freq: Once | INTRAMUSCULAR | Status: AC
  Administered 2015-06-22: 80 mg via INTRAMUSCULAR

## 2015-06-23 ENCOUNTER — Telehealth: Payer: Self-pay | Admitting: *Deleted

## 2015-06-23 NOTE — Telephone Encounter (Signed)
Disability and leave forms received via fax from [place]. Filled out as much as possible and forwarded to [place]. JG//CMA

## 2015-06-24 NOTE — Telephone Encounter (Signed)
FMLA and disability forms that she just dropped off are with [place]. He is aware of dates. JG//CMA

## 2015-06-24 NOTE — Telephone Encounter (Signed)
Pt came in office to bring Disability paperwork to be filled out and states that at her job they informed her that on the FMLA papers that are at our offices needs to have date changed in section #8 (updated).

## 2015-06-26 NOTE — Assessment & Plan Note (Signed)
Increase Tegretol to 400 mg BID.

## 2015-06-26 NOTE — Assessment & Plan Note (Signed)
Will obtain dg cervical spine giving new symptoms. Will proceed with MRI if indicated. Percocet refilled. Tegretol increased to 400 mg BID to help with nerve related neck pain and TGN. Follow-up with Neurology as scheduled.

## 2015-06-27 NOTE — Telephone Encounter (Signed)
Disability forms and updated FMLA forms all faxed to [facility] successfully. Sent for scanning. JG//CMA

## 2015-06-29 ENCOUNTER — Telehealth: Payer: Self-pay | Admitting: Physician Assistant

## 2015-06-29 ENCOUNTER — Ambulatory Visit: Payer: BLUE CROSS/BLUE SHIELD | Admitting: Physician Assistant

## 2015-06-30 NOTE — Telephone Encounter (Signed)
Pt was no show 06/29/15 5:30pm, follow up appt, pt called 3:00pm 06/29/15 to cancel and reschedule for 07/04/15, charge for no show?

## 2015-06-30 NOTE — Telephone Encounter (Signed)
No charge. 

## 2015-07-04 ENCOUNTER — Ambulatory Visit (INDEPENDENT_AMBULATORY_CARE_PROVIDER_SITE_OTHER): Payer: BLUE CROSS/BLUE SHIELD | Admitting: Physician Assistant

## 2015-07-04 ENCOUNTER — Encounter (INDEPENDENT_AMBULATORY_CARE_PROVIDER_SITE_OTHER): Payer: Self-pay

## 2015-07-04 ENCOUNTER — Encounter: Payer: Self-pay | Admitting: Physician Assistant

## 2015-07-04 ENCOUNTER — Telehealth: Payer: Self-pay | Admitting: *Deleted

## 2015-07-04 VITALS — BP 146/85 | HR 88 | Temp 98.1°F | Resp 16 | Ht 67.0 in | Wt 239.5 lb

## 2015-07-04 DIAGNOSIS — R519 Headache, unspecified: Secondary | ICD-10-CM

## 2015-07-04 DIAGNOSIS — M5090 Cervical disc disorder, unspecified, unspecified cervical region: Secondary | ICD-10-CM | POA: Diagnosis not present

## 2015-07-04 DIAGNOSIS — R51 Headache: Secondary | ICD-10-CM

## 2015-07-04 MED ORDER — OXYCODONE-ACETAMINOPHEN 5-325 MG PO TABS: 1.0000 | ORAL_TABLET | Freq: Four times a day (QID) | ORAL | Status: DC | PRN

## 2015-07-04 NOTE — Patient Instructions (Signed)
Please continue medications as directed. You will be contacted to schedule an MRI.  Please follow-up with Neurology as scheduled. I will call you with your new appointment day and time once we hear back from Dr. Moises Blood office.

## 2015-07-04 NOTE — Assessment & Plan Note (Signed)
Will call Neuro to move up appointment. Question is current cervical symptoms contributing or are we dealing with two separate issues as headaches were first symptom. MRI ordered. Patient to continue medications and go to Neurology appointment.

## 2015-07-04 NOTE — Assessment & Plan Note (Signed)
Pain medication refilled. Will proceed with MRI to further assess. Supportive measures reviewed.

## 2015-07-04 NOTE — Progress Notes (Signed)
Patient presents to clinic today for follow-up. Still having frequent headaches and neck pain without radiation of pain into upper extremities but with tingling and numbness. X-ray obtained at last visit revealing moderate degenerative changes in cervical spine especially at C5-C6. Has not seen Neurology but has appointment scheduled. Is taking medications as directed.  Past Medical History  Diagnosis Date  . GERD (gastroesophageal reflux disease)   . HA (headache)   . History of chicken pox   . Migraine     Current Outpatient Prescriptions on File Prior to Visit  Medication Sig Dispense Refill  . amitriptyline (ELAVIL) 10 MG tablet Take 1 tablet (10 mg total) by mouth at bedtime. 30 tablet 1  . aspirin 81 MG chewable tablet Chew 1 tablet (81 mg total) by mouth daily. 30 tablet 0  . carbamazepine (TEGRETOL) 200 MG tablet Take 2 tablets (400 mg total) by mouth 2 (two) times daily. 120 tablet 0  . meloxicam (MOBIC) 15 MG tablet Take 1 tablet (15 mg total) by mouth daily. 15 tablet 0  . omeprazole (PRILOSEC) 20 MG capsule Take 1 capsule (20 mg total) by mouth daily. 30 capsule 3  . ondansetron (ZOFRAN ODT) 8 MG disintegrating tablet Take 1 tablet (8 mg total) by mouth every 8 (eight) hours as needed for nausea or vomiting. 10 tablet 0  . sucralfate (CARAFATE) 1 G tablet Take 1 tablet (1 g total) by mouth 2 (two) times daily. 40 tablet 0  . tizanidine (ZANAFLEX) 6 MG capsule Take 1 capsule (6 mg total) by mouth 3 (three) times daily as needed for muscle spasms. 12 capsule 0   No current facility-administered medications on file prior to visit.    Allergies  Allergen Reactions  . Vicodin [Hydrocodone-Acetaminophen] Nausea And Vomiting    Family History  Problem Relation Age of Onset  . Congestive Heart Failure Mother 43    Deceased  . Stroke Mother   . Stroke Brother     multiple [6]  . Hypertension Brother     x3  . Hypertension Sister     x4  . Diabetes Sister   . Diabetes  Brother   . Diabetes Mother   . Diabetes Maternal Grandmother   . Breast cancer Maternal Aunt   . Heart attack Maternal Uncle   . Healthy Son     x1  . Healthy Daughter     x2    Social History   Social History  . Marital Status: Single    Spouse Name: N/A  . Number of Children: N/A  . Years of Education: N/A   Social History Main Topics  . Smoking status: Never Smoker   . Smokeless tobacco: Never Used  . Alcohol Use: No     Comment: rare   . Drug Use: No  . Sexual Activity:    Partners: Male   Other Topics Concern  . None   Social History Narrative    Review of Systems - See HPI.  All other ROS are negative.  BP 146/85 mmHg  Pulse 88  Temp(Src) 98.1 F (36.7 C) (Oral)  Resp 16  Ht  (1.702 m)  Wt 239 lb 8 oz (108.636 kg)  BMI 37.50 kg/m2  SpO2 100%  LMP 04/05/2015  Physical Exam  Constitutional: She is oriented to person, place, and time and well-developed, well-nourished, and in no distress.  HENT:  Head: Normocephalic and atraumatic.  Cardiovascular: Normal rate, normal heart sounds and intact distal pulses.  Pulmonary/Chest: Effort normal and breath sounds normal. No respiratory distress. She has no wheezes. She has no rales. She exhibits no tenderness.  Musculoskeletal:       Cervical back: She exhibits pain. She exhibits normal range of motion, no tenderness and no spasm.  Neurological: She is alert and oriented to person, place, and time.  Skin: Skin is warm and dry. No rash noted.  Psychiatric: Affect normal.  Nursing note and vitals reviewed.   Recent Results (from the past 2160 hour(s))  POCT urine pregnancy     Status: None   Collection Time: 06/16/15 10:19 AM  Result Value Ref Range   Preg Test, Ur Negative Negative    Assessment/Plan: Cervical neck pain with evidence of disc disease Pain medication refilled. Will proceed with MRI to further assess. Supportive measures reviewed.  Frequent headaches Will call Neuro to move up  appointment. Question is current cervical symptoms contributing or are we dealing with two separate issues as headaches were first symptom. MRI ordered. Patient to continue medications and go to Neurology appointment.

## 2015-07-04 NOTE — Progress Notes (Signed)
Pre visit review using our clinic review tool, if applicable. No additional management support is needed unless otherwise documented below in the visit note/SLS  

## 2015-07-05 ENCOUNTER — Other Ambulatory Visit: Payer: Self-pay | Admitting: Neurology

## 2015-07-05 ENCOUNTER — Ambulatory Visit: Payer: BLUE CROSS/BLUE SHIELD | Admitting: Neurology

## 2015-07-05 DIAGNOSIS — R9082 White matter disease, unspecified: Secondary | ICD-10-CM

## 2015-07-05 NOTE — Telephone Encounter (Signed)
Appointment scheduled with Neurology

## 2015-07-08 NOTE — Telephone Encounter (Signed)
error 

## 2015-07-09 ENCOUNTER — Other Ambulatory Visit (HOSPITAL_BASED_OUTPATIENT_CLINIC_OR_DEPARTMENT_OTHER): Payer: BLUE CROSS/BLUE SHIELD

## 2015-07-09 ENCOUNTER — Ambulatory Visit (HOSPITAL_BASED_OUTPATIENT_CLINIC_OR_DEPARTMENT_OTHER)
Admission: RE | Admit: 2015-07-09 | Discharge: 2015-07-09 | Disposition: A | Discharge status: Home / Self Care | Payer: BLUE CROSS/BLUE SHIELD | Source: Ambulatory Visit | Attending: Neurology | Admitting: Neurology

## 2015-07-09 DIAGNOSIS — M542 Cervicalgia: Secondary | ICD-10-CM | POA: Diagnosis not present

## 2015-07-09 DIAGNOSIS — R200 Anesthesia of skin: Secondary | ICD-10-CM | POA: Diagnosis not present

## 2015-07-09 DIAGNOSIS — R9082 White matter disease, unspecified: Secondary | ICD-10-CM | POA: Diagnosis not present

## 2015-07-09 DIAGNOSIS — R51 Headache: Secondary | ICD-10-CM | POA: Insufficient documentation

## 2015-07-09 DIAGNOSIS — H538 Other visual disturbances: Secondary | ICD-10-CM | POA: Diagnosis not present

## 2015-07-09 DIAGNOSIS — R202 Paresthesia of skin: Secondary | ICD-10-CM | POA: Diagnosis not present

## 2015-07-11 ENCOUNTER — Telehealth: Payer: Self-pay | Admitting: *Deleted

## 2015-07-11 NOTE — Telephone Encounter (Signed)
Spoke with patient about MRI results  Patient had no further questions

## 2015-07-11 NOTE — Telephone Encounter (Signed)
Notes Recorded by Drema Dallas, DO on 07/11/2015 at 7:10 AM Repeat MRI of the brain looks stable. No changes. It is pretty much an unremarkable study.   L/M for patient to return call

## 2015-07-16 ENCOUNTER — Ambulatory Visit (HOSPITAL_BASED_OUTPATIENT_CLINIC_OR_DEPARTMENT_OTHER): Payer: BLUE CROSS/BLUE SHIELD

## 2015-07-21 ENCOUNTER — Ambulatory Visit (HOSPITAL_COMMUNITY): Payer: No Typology Code available for payment source

## 2015-07-23 ENCOUNTER — Ambulatory Visit (HOSPITAL_BASED_OUTPATIENT_CLINIC_OR_DEPARTMENT_OTHER)
Admission: RE | Admit: 2015-07-23 | Discharge: 2015-07-23 | Disposition: A | Discharge status: Home / Self Care | Payer: BLUE CROSS/BLUE SHIELD | Source: Ambulatory Visit | Attending: Physician Assistant | Admitting: Physician Assistant

## 2015-07-23 DIAGNOSIS — R519 Headache, unspecified: Secondary | ICD-10-CM

## 2015-07-23 DIAGNOSIS — R531 Weakness: Secondary | ICD-10-CM | POA: Diagnosis not present

## 2015-07-23 DIAGNOSIS — M50222 Other cervical disc displacement at C5-C6 level: Secondary | ICD-10-CM | POA: Insufficient documentation

## 2015-07-23 DIAGNOSIS — R51 Headache: Secondary | ICD-10-CM

## 2015-07-23 DIAGNOSIS — R200 Anesthesia of skin: Secondary | ICD-10-CM | POA: Insufficient documentation

## 2015-07-23 DIAGNOSIS — M4802 Spinal stenosis, cervical region: Secondary | ICD-10-CM | POA: Diagnosis not present

## 2015-07-23 DIAGNOSIS — M5090 Cervical disc disorder, unspecified, unspecified cervical region: Secondary | ICD-10-CM

## 2015-07-23 DIAGNOSIS — M542 Cervicalgia: Secondary | ICD-10-CM | POA: Diagnosis not present

## 2015-07-25 ENCOUNTER — Telehealth: Payer: Self-pay | Admitting: *Deleted

## 2015-07-25 DIAGNOSIS — M4802 Spinal stenosis, cervical region: Secondary | ICD-10-CM

## 2015-07-25 DIAGNOSIS — M5090 Cervical disc disorder, unspecified, unspecified cervical region: Secondary | ICD-10-CM

## 2015-07-25 NOTE — Telephone Encounter (Signed)
-----   Message from Waldon MerlWilliam C Martin, PA-C sent at 07/25/2015  7:06 AM EDT ----- MRI reveals significant bulging disc at C5-C6 that is pressing on spinal cord. She needs referral to Neurosurgery (Urgent) for assessment and management. Ok to place referral if patient is willing.

## 2015-07-25 NOTE — Telephone Encounter (Signed)
Called and spoke with the pt and informed her of recent MRI results and note.  Pt verbalized understanding and agreed to the referral to the Neurosurgery.  Referral placed.//AB/CMA
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
## 2015-08-11 ENCOUNTER — Ambulatory Visit: Payer: BLUE CROSS/BLUE SHIELD | Admitting: Neurology

## 2015-08-11 NOTE — Telephone Encounter (Signed)
Pt has surgery scheduled for 08/24/15. I just received a call from [facility] Bison([place]) regarding her forms. She states that pt's surgeon did not write her out of work until her surgery date (08/23/15). Delice Bisonara would like to know if you could extend her leave and approve pt to be off from 08/12/15 - 08/24/15. Please advise. JG//CMA   *If approved, fax number is [phone] and phone is [phone]. Updated info can be sent on a fax cover sheet, per Delice Bisonara, no need to update forms. JG//CMA

## 2015-08-11 NOTE — Telephone Encounter (Signed)
I approve those changes. Let me know if there is anything else needed to be done on my part. If anything needs signing I will do so tomorrow morning when I am back in office.

## 2015-08-12 NOTE — Telephone Encounter (Signed)
[facility]ax[facility]. Sent for scanning. JG//CMA

## 2015-08-15 ENCOUNTER — Telehealth: Payer: Self-pay | Admitting: Physician Assistant

## 2015-08-15 NOTE — Telephone Encounter (Signed)
Pt needs letter for work writing her out from date last seen (07/04/15 thru 07/26/15). She saw Neurology 07/26/15 and they scheduled her for surgery next week. They will write notes from the time they saw her forward. Please call when ready at [phone].

## 2015-08-15 NOTE — Telephone Encounter (Signed)
I already changed the FMLA to cover this. If she is needing work note too, ok to write

## 2015-08-16 NOTE — Telephone Encounter (Signed)
Letter ready, pt informed/SLS

## 2015-10-18 ENCOUNTER — Ambulatory Visit: Payer: BLUE CROSS/BLUE SHIELD | Admitting: Physician Assistant

## 2015-10-18 ENCOUNTER — Telehealth: Payer: Self-pay | Admitting: Physician Assistant

## 2015-10-18 NOTE — Telephone Encounter (Signed)
No charge. 

## 2015-10-18 NOTE — Telephone Encounter (Signed)
Pt missed appt today 1/17 10:00am for follow up. Pt left VM 1/17 8:25am with no details. I called pt and she states that she didn't get paid and couldn't make copay so didn't come in. Informed in the future we need 24 hr notice or to come in and we may waive copay that visit. Charge or no charge?

## 2015-10-25 ENCOUNTER — Ambulatory Visit: Payer: BLUE CROSS/BLUE SHIELD | Admitting: Physician Assistant

## 2015-11-16 ENCOUNTER — Telehealth: Payer: Self-pay | Admitting: Physician Assistant

## 2015-11-16 NOTE — Telephone Encounter (Signed)
[facility] [place] Day - Client TELEPHONE ADVICE RECORD   [facility]     Patient Name: Kim Macdonald Initial Comment Caller states that she is having a heavy menstrual cycle, out of work for three days.  DOB: 1970-12-29      Nurse Assessment  Nurse: Tera Mater, RN, Elnita Maxwell Date/Time (Eastern Time): 11/16/2015 3:59:10 PM  Confirm and document reason for call. If symptomatic, describe symptoms. You must click the next button to save text entered. ---Caller states that she started her period on Sunday and she has been having severe bleeding for the last 3 days. States that she is going through approx 2 pads an hr.  Has the patient traveled out of the country within the last 30 days? ---Not Applicable  Does the patient have any new or worsening symptoms? ---Yes  Will a triage be completed? ---Yes  Related visit to physician within the last 2 weeks? ---No  Does the PT have any chronic conditions? (i.e. diabetes, asthma, etc.) ---Yes  List chronic conditions. ---acid reflux  Is the patient pregnant or possibly pregnant? (Ask all females between the ages of 106-55) ---No  Is this a behavioral health or substance abuse call? ---No    Guidelines     Guideline Title Affirmed Question Affirmed Notes   Vaginal Bleeding - Abnormal SEVERE vaginal bleeding (i.e., soaking 2 pads or tampons per hour and present 2 or more hours)    Final Disposition User   Go to ED Now (or PCP triage) Tera Mater, RN, Cheryl     Referrals   [facility] - ED   Disagree/Comply: Comply

## 2015-11-17 ENCOUNTER — Encounter (HOSPITAL_BASED_OUTPATIENT_CLINIC_OR_DEPARTMENT_OTHER): Payer: Self-pay

## 2015-11-17 ENCOUNTER — Emergency Department (HOSPITAL_BASED_OUTPATIENT_CLINIC_OR_DEPARTMENT_OTHER): Payer: BLUE CROSS/BLUE SHIELD

## 2015-11-17 ENCOUNTER — Emergency Department (HOSPITAL_BASED_OUTPATIENT_CLINIC_OR_DEPARTMENT_OTHER)
Admission: EM | Admit: 2015-11-17 | Discharge: 2015-11-17 | Disposition: A | Discharge status: Home / Self Care | Payer: BLUE CROSS/BLUE SHIELD | Attending: Emergency Medicine | Admitting: Emergency Medicine

## 2015-11-17 DIAGNOSIS — N939 Abnormal uterine and vaginal bleeding, unspecified: Secondary | ICD-10-CM

## 2015-11-17 DIAGNOSIS — Z8719 Personal history of other diseases of the digestive system: Secondary | ICD-10-CM | POA: Diagnosis not present

## 2015-11-17 DIAGNOSIS — Z8679 Personal history of other diseases of the circulatory system: Secondary | ICD-10-CM | POA: Insufficient documentation

## 2015-11-17 DIAGNOSIS — Z8619 Personal history of other infectious and parasitic diseases: Secondary | ICD-10-CM | POA: Insufficient documentation

## 2015-11-17 DIAGNOSIS — O209 Hemorrhage in early pregnancy, unspecified: Secondary | ICD-10-CM | POA: Diagnosis present

## 2015-11-17 DIAGNOSIS — Z3A01 Less than 8 weeks gestation of pregnancy: Secondary | ICD-10-CM | POA: Diagnosis not present

## 2015-11-17 LAB — CBC WITH DIFFERENTIAL/PLATELET
Basophils Absolute: 0 10*3/uL (ref 0.0–0.1)
Basophils Relative: 0 %
EOS ABS: 0.1 10*3/uL (ref 0.0–0.7)
Eosinophils Relative: 1 %
HEMATOCRIT: 37.5 % (ref 36.0–46.0)
HEMOGLOBIN: 12.2 g/dL (ref 12.0–15.0)
LYMPHS ABS: 1.4 10*3/uL (ref 0.7–4.0)
LYMPHS PCT: 13 %
MCH: 30.9 pg (ref 26.0–34.0)
MCHC: 32.5 g/dL (ref 30.0–36.0)
MCV: 94.9 fL (ref 78.0–100.0)
MONOS PCT: 8 %
Monocytes Absolute: 0.9 10*3/uL (ref 0.1–1.0)
NEUTROS ABS: 8.5 10*3/uL — AB (ref 1.7–7.7)
Neutrophils Relative %: 78 %
Platelets: 303 10*3/uL (ref 150–400)
RBC: 3.95 MIL/uL (ref 3.87–5.11)
RDW: 12.9 % (ref 11.5–15.5)
WBC: 10.9 10*3/uL — AB (ref 4.0–10.5)

## 2015-11-17 LAB — BASIC METABOLIC PANEL
Anion gap: 9 (ref 5–15)
BUN: 8 mg/dL (ref 6–20)
CHLORIDE: 105 mmol/L (ref 101–111)
CO2: 22 mmol/L (ref 22–32)
CREATININE: 0.8 mg/dL (ref 0.44–1.00)
Calcium: 8.6 mg/dL — ABNORMAL LOW (ref 8.9–10.3)
GFR calc non Af Amer: >60 mL/min (ref >60)
Glucose, Bld: 112 mg/dL — ABNORMAL HIGH (ref 65–99)
POTASSIUM: 3.4 mmol/L — AB (ref 3.5–5.1)
Sodium: 136 mmol/L (ref 135–145)

## 2015-11-17 LAB — PREGNANCY, URINE: Preg Test, Ur: POSITIVE — AB

## 2015-11-17 LAB — URINE MICROSCOPIC-ADD ON

## 2015-11-17 LAB — URINALYSIS, ROUTINE W REFLEX MICROSCOPIC
BILIRUBIN URINE: NEGATIVE
Glucose, UA: NEGATIVE mg/dL
Ketones, ur: 15 mg/dL — AB
Leukocytes, UA: NEGATIVE
NITRITE: NEGATIVE
PH: 5.5 (ref 5.0–8.0)
Protein, ur: 30 mg/dL — AB
SPECIFIC GRAVITY, URINE: 1.029 (ref 1.005–1.030)

## 2015-11-17 LAB — HCG, QUANTITATIVE, PREGNANCY: HCG, BETA CHAIN, QUANT, S: 2728 m[IU]/mL — AB (ref <5)

## 2015-11-17 NOTE — ED Provider Notes (Signed)
Final result by Rad Results In Interface (11/17/15 17:28:59)   Narrative:   CLINICAL DATA: Vaginal bleeding with clots for 4 days. First-trimester pregnancy.  EXAM: OBSTETRIC <14 WK [place] AND TRANSVAGINAL OB US  TECHNIQUE: Both transabdominal and transvaginal ultrasound examinations were performed for complete evaluation of the gestation as well as the maternal uterus, adnexal regions, and pelvic cul-de-sac. Transvaginal technique was performed to assess early pregnancy.  COMPARISON: Abdominal CT 03/24/2015  FINDINGS: Intrauterine gestational sac: Probably present with normal shape  Yolk sac: None definitely seen  Embryo: Not seen  MSD: 7.8 mm  5 w  4 d       [place] EDC: 07/15/2016  Subchorionic hemorrhage: None visualized.  Maternal uterus/adnexae: There is a 33 mm mass right of the uterine body which has a sonographic echotexture consistent with fibroid. An exophytic/pedunculated fibroid was seen on 2016 abdominal CT.  IMPRESSION: 1. Probable early intrauterine gestational sac, but no yolk sac or fetal pole visualized. Recommend follow-up quantitative B-HCG levels and follow-up US in 14 days to confirm and assess viability. This recommendation follows SRU consensus guidelines: Diagnostic Criteria for Nonviable Pregnancy Early in the First Trimester. Malva Limes Med 2013; 161:0960-45. 2. No subchorionic clot. 3. 33 mm pedunculated fibroid.   Electronically Signed By: Marnee Spring M.D.   Ultrasound shows a probable early intrauterine gestational sac but no U sac present. HCG is only 2700. This could just be due to early pregnancy. However patient also may be having a spontaneous miscarriage. Patient sees an OB/GYN in [place] by the name of Dr. Okey Dupre. She was instructed to call the office tomorrow for follow-up hCG levels.  Gwyneth Sprout, MD 11/17/15 1747

## 2015-11-17 NOTE — ED Notes (Signed)
C/o heavy vaginal bleeding since Monday-hx of irregular pds-has been seen by GYN-no dx or tx-NAD-steady gait

## 2015-11-17 NOTE — ED Notes (Signed)
Pt states heavier than normal vaginal bleeding during periods, which are irregular, but this time, her period has been "extremely heavy" for 4 days with large clots.  Pt also c/o suprapubic pain that started this morning.

## 2015-11-17 NOTE — ED Provider Notes (Signed)
CSN: [id number]     Arrival date & time 11/17/15  1240 History   First MD Initiated Contact with Patient 11/17/15 1428     Chief Complaint  Patient presents with  . Vaginal Bleeding     (Consider location/radiation/quality/duration/timing/severity/associated sxs/prior Treatment) HPI Comments: Patient is a 45 year old female with no significant past medical history. She presents for evaluation of vaginal bleeding that has worsened over the past 4 days. She denies any significant discomfort or pain. She does report abnormal menstrual periods over the past several years. Her last menstrual period was in December. She denies possibility of pregnancy.  Patient is a 45 y.o. female presenting with vaginal bleeding. The history is provided by the patient.  Vaginal Bleeding Quality:  Bright red Severity:  Moderate Onset quality:  Gradual Duration:  4 days Timing:  Constant Progression:  Worsening Chronicity:  New Menstrual history:  Irregular Relieved by:  Nothing Worsened by:  Nothing tried Ineffective treatments:  None tried Associated symptoms: no abdominal pain, no dysuria and no fever     Past Medical History  Diagnosis Date  . GERD (gastroesophageal reflux disease)   . HA (headache)   . History of chicken pox   . Migraine    Past Surgical History  Procedure Laterality Date  . No past surgeries    . Cervical spine surgery     Family History  Problem Relation Age of Onset  . Congestive Heart Failure Mother 52    Deceased  . Stroke Mother   . Stroke Brother     multiple [6]  . Hypertension Brother     x3  . Hypertension Sister     x4  . Diabetes Sister   . Diabetes Brother   . Diabetes Mother   . Diabetes Maternal Grandmother   . Breast cancer Maternal Aunt   . Heart attack Maternal Uncle   . Healthy Son     x1  . Healthy Daughter     x2   Social History  Substance Use Topics  . Smoking status: Never Smoker   . Smokeless tobacco: Never Used  . Alcohol Use:  0.0 oz/week    0 Standard drinks or equivalent per week     Comment: rare    OB History    No data available     Review of Systems  Constitutional: Negative for fever.  Gastrointestinal: Negative for abdominal pain.  Genitourinary: Positive for vaginal bleeding. Negative for dysuria.  All other systems reviewed and are negative.     Allergies  Vicodin  Home Medications   Prior to Admission medications   Medication Sig Start Date End Date Taking? Authorizing Provider  ondansetron (ZOFRAN ODT) 8 MG disintegrating tablet Take 1 tablet (8 mg total) by mouth every 8 (eight) hours as needed for nausea or vomiting. 06/15/15   [name], MD   BP 121/72 mmHg  Pulse 78  Temp(Src) 98.6 F (37 C) (Oral)  Resp 18  Ht  (1.702 m)  Wt 240 lb (108.863 kg)  BMI 37.58 kg/m2  SpO2 98%  LMP 11/14/2015 Physical Exam  Constitutional: She is oriented to person, place, and time. She appears well-developed and well-nourished. No distress.  HENT:  Head: Normocephalic and atraumatic.  Neck: Normal range of motion. Neck supple.  Cardiovascular: Normal rate and regular rhythm.  Exam reveals no gallop and no friction rub.   No murmur heard. Pulmonary/Chest: Effort normal and breath sounds normal. No respiratory distress. She has no wheezes.  Abdominal:  Soft. Bowel sounds are normal. She exhibits no distension. There is tenderness.  There is mild suprapubic tenderness to palpation. There is no rebound and no guarding.  Genitourinary:  There is red blood in the vaginal vault along with some clotting. The os appears closed. There are no adnexal masses or significant tenderness.  Musculoskeletal: Normal range of motion.  Neurological: She is alert and oriented to person, place, and time.  Skin: Skin is warm and dry. She is not diaphoretic.  Nursing note and vitals reviewed.   ED Course  Procedures (including critical care time) Labs Review Labs Reviewed  CBC WITH DIFFERENTIAL/PLATELET -  Abnormal; Notable for the following:    WBC 10.9 (*)    Neutro Abs 8.5 (*)    All other components within normal limits  PREGNANCY, URINE  URINALYSIS, ROUTINE W REFLEX MICROSCOPIC (NOT AT [facility])  BASIC METABOLIC PANEL    Imaging Review No results found. I have personally reviewed and evaluated these images and lab results as part of my medical decision-making.    MDM   Final diagnoses:  Vaginal bleeding    Patient presents with complaints of vaginal bleeding. Her pregnancy test returned positive, and for this reason she will undergo an ultrasound to evaluate for ectopic pregnancy/fetal well-being. Care will be signed out to Dr. Anitra Lauth awaiting this ultrasound. She will obtain the results of the ultrasound and determine the final disposition.    Geoffery Lyons, MD 11/20/15 [phone]

## 2015-11-23 ENCOUNTER — Telehealth: Payer: Self-pay | Admitting: Physician Assistant

## 2015-11-23 NOTE — Telephone Encounter (Signed)
LM for pt to call and schedule flu shot or update records. °

## 2015-11-28 ENCOUNTER — Encounter (HOSPITAL_BASED_OUTPATIENT_CLINIC_OR_DEPARTMENT_OTHER): Payer: Self-pay | Admitting: *Deleted

## 2015-11-28 ENCOUNTER — Emergency Department (HOSPITAL_BASED_OUTPATIENT_CLINIC_OR_DEPARTMENT_OTHER): Payer: BLUE CROSS/BLUE SHIELD

## 2015-11-28 ENCOUNTER — Emergency Department (HOSPITAL_BASED_OUTPATIENT_CLINIC_OR_DEPARTMENT_OTHER)
Admission: EM | Admit: 2015-11-28 | Discharge: 2015-11-28 | Disposition: A | Discharge status: Home / Self Care | Payer: BLUE CROSS/BLUE SHIELD | Attending: Emergency Medicine | Admitting: Emergency Medicine

## 2015-11-28 DIAGNOSIS — O209 Hemorrhage in early pregnancy, unspecified: Secondary | ICD-10-CM | POA: Diagnosis present

## 2015-11-28 DIAGNOSIS — Z8719 Personal history of other diseases of the digestive system: Secondary | ICD-10-CM | POA: Insufficient documentation

## 2015-11-28 DIAGNOSIS — Z8679 Personal history of other diseases of the circulatory system: Secondary | ICD-10-CM | POA: Diagnosis not present

## 2015-11-28 DIAGNOSIS — O039 Complete or unspecified spontaneous abortion without complication: Secondary | ICD-10-CM | POA: Diagnosis not present

## 2015-11-28 DIAGNOSIS — Z3A00 Weeks of gestation of pregnancy not specified: Secondary | ICD-10-CM | POA: Diagnosis not present

## 2015-11-28 DIAGNOSIS — Z8619 Personal history of other infectious and parasitic diseases: Secondary | ICD-10-CM | POA: Diagnosis not present

## 2015-11-28 LAB — BASIC METABOLIC PANEL
ANION GAP: 9 (ref 5–15)
BUN: 9 mg/dL (ref 6–20)
CHLORIDE: 106 mmol/L (ref 101–111)
CO2: 20 mmol/L — AB (ref 22–32)
Calcium: 8.5 mg/dL — ABNORMAL LOW (ref 8.9–10.3)
Creatinine, Ser: 0.79 mg/dL (ref 0.44–1.00)
GFR calc non Af Amer: >60 mL/min (ref >60)
GLUCOSE: 100 mg/dL — AB (ref 65–99)
POTASSIUM: 4.2 mmol/L (ref 3.5–5.1)
Sodium: 135 mmol/L (ref 135–145)

## 2015-11-28 LAB — URINALYSIS, ROUTINE W REFLEX MICROSCOPIC
Bilirubin Urine: NEGATIVE
Glucose, UA: NEGATIVE mg/dL
Ketones, ur: 15 mg/dL — AB
LEUKOCYTES UA: NEGATIVE
NITRITE: NEGATIVE
PROTEIN: NEGATIVE mg/dL
Specific Gravity, Urine: 1.028 (ref 1.005–1.030)
pH: 5.5 (ref 5.0–8.0)

## 2015-11-28 LAB — CBC WITH DIFFERENTIAL/PLATELET
BASOS PCT: 0 %
Basophils Absolute: 0 10*3/uL (ref 0.0–0.1)
EOS ABS: 0.2 10*3/uL (ref 0.0–0.7)
Eosinophils Relative: 2 %
HEMATOCRIT: 33.8 % — AB (ref 36.0–46.0)
Hemoglobin: 11.2 g/dL — ABNORMAL LOW (ref 12.0–15.0)
Lymphocytes Relative: 19 %
Lymphs Abs: 2.5 10*3/uL (ref 0.7–4.0)
MCH: 31.7 pg (ref 26.0–34.0)
MCHC: 33.1 g/dL (ref 30.0–36.0)
MCV: 95.8 fL (ref 78.0–100.0)
MONO ABS: 1.1 10*3/uL — AB (ref 0.1–1.0)
MONOS PCT: 8 %
Neutro Abs: 9.2 10*3/uL — ABNORMAL HIGH (ref 1.7–7.7)
Neutrophils Relative %: 71 %
Platelets: 328 10*3/uL (ref 150–400)
RBC: 3.53 MIL/uL — ABNORMAL LOW (ref 3.87–5.11)
RDW: 13.1 % (ref 11.5–15.5)
WBC: 13 10*3/uL — ABNORMAL HIGH (ref 4.0–10.5)

## 2015-11-28 LAB — URINE MICROSCOPIC-ADD ON

## 2015-11-28 LAB — WET PREP, GENITAL
CLUE CELLS WET PREP: NONE SEEN
SPERM: NONE SEEN
TRICH WET PREP: NONE SEEN
Yeast Wet Prep HPF POC: NONE SEEN

## 2015-11-28 LAB — HCG, QUANTITATIVE, PREGNANCY: HCG, BETA CHAIN, QUANT, S: 3600 m[IU]/mL — AB (ref <5)

## 2015-11-28 LAB — PREGNANCY, URINE: PREG TEST UR: POSITIVE — AB

## 2015-11-28 MED ORDER — ACETAMINOPHEN 325 MG PO TABS
650.0000 mg | ORAL_TABLET | Freq: Once | ORAL | Status: AC
  Administered 2015-11-28: 650 mg via ORAL
  Filled 2015-11-28: qty 2

## 2015-11-28 NOTE — ED Notes (Signed)
Pt called out - provider notified pt asking about dispo and results of ct scan.

## 2015-11-28 NOTE — ED Notes (Signed)
Vaginal bleeding. States she had a miscarriage on February 16th. She was seen here and told to follow up at [facility]. She never went for her follow up. Bleeding has continued since the 11th. The bleeding got worse today.

## 2015-11-28 NOTE — Discharge Instructions (Signed)
Schedule a follow up appointment with your OBGYN for a visit in 2-3 days.   Miscarriage A miscarriage is the sudden loss of an unborn baby (fetus) before the 20th week of pregnancy. Most miscarriages happen in the first 3 months of pregnancy. Sometimes, it happens before a woman even knows she is pregnant. A miscarriage is also called a "spontaneous miscarriage" or "early pregnancy loss." Having a miscarriage can be an emotional experience. Talk with your caregiver about any questions you may have about miscarrying, the grieving process, and your future pregnancy plans. CAUSES   Problems with the fetal chromosomes that make it impossible for the baby to develop normally. Problems with the baby's genes or chromosomes are most often the result of errors that occur, by chance, as the embryo divides and grows. The problems are not inherited from the parents.  Infection of the cervix or uterus.   Hormone problems.   Problems with the cervix, such as having an incompetent cervix. This is when the tissue in the cervix is not strong enough to hold the pregnancy.   Problems with the uterus, such as an abnormally shaped uterus, uterine fibroids, or congenital abnormalities.   Certain medical conditions.   Smoking, drinking alcohol, or taking illegal drugs.   Trauma.  Often, the cause of a miscarriage is unknown.  SYMPTOMS   Vaginal bleeding or spotting, with or without cramps or pain.  Pain or cramping in the abdomen or lower back.  Passing fluid, tissue, or blood clots from the vagina. DIAGNOSIS  Your caregiver will perform a physical exam. You may also have an ultrasound to confirm the miscarriage. Blood or urine tests may also be ordered. TREATMENT   Sometimes, treatment is not necessary if you naturally pass all the fetal tissue that was in the uterus. If some of the fetus or placenta remains in the body (incomplete miscarriage), tissue left behind may become infected and must be  removed. Usually, a dilation and curettage (D and C) procedure is performed. During a D and C procedure, the cervix is widened (dilated) and any remaining fetal or placental tissue is gently removed from the uterus.  Antibiotic medicines are prescribed if there is an infection. Other medicines may be given to reduce the size of the uterus (contract) if there is a lot of bleeding.  If you have Rh negative blood and your baby was Rh positive, you will need a Rh immunoglobulin shot. This shot will protect any future baby from having Rh blood problems in future pregnancies. HOME CARE INSTRUCTIONS   Your caregiver may order bed rest or may allow you to continue light activity. Resume activity as directed by your caregiver.  Have someone help with home and family responsibilities during this time.   Keep track of the number of sanitary pads you use each day and how soaked (saturated) they are. Write down this information.   Do not use tampons. Do not douche or have sexual intercourse until approved by your caregiver.   Only take over-the-counter or prescription medicines for pain or discomfort as directed by your caregiver.   Do not take aspirin. Aspirin can cause bleeding.   Keep all follow-up appointments with your caregiver.   If you or your partner have problems with grieving, talk to your caregiver or seek counseling to help cope with the pregnancy loss. Allow enough time to grieve before trying to get pregnant again.  SEEK IMMEDIATE MEDICAL CARE IF:   You have severe cramps or pain in  your back or abdomen.  You have a fever.  You pass large blood clots (walnut-sized or larger) ortissue from your vagina. Save any tissue for your caregiver to inspect.   Your bleeding increases.   You have a thick, bad-smelling vaginal discharge.  You become lightheaded, weak, or you faint.   You have chills.  MAKE SURE YOU:  Understand these instructions.  Will watch your  condition.  Will get help right away if you are not doing well or get worse.   This information is not intended to replace advice given to you by your health care provider. Make sure you discuss any questions you have with your health care provider.   Document Released: 03/13/2001 Document Revised: 01/12/2013 Document Reviewed: 11/06/2011 Elsevier Interactive Patient Education [employer]

## 2015-11-28 NOTE — ED Notes (Addendum)
Pt requesting pain meds, provider notified. Pelvic setup to bedside.

## 2015-11-28 NOTE — ED Notes (Signed)
Patient transported to ultrasound via stretcher per tech. 

## 2015-11-28 NOTE — ED Provider Notes (Signed)
CSN: [id number]     Arrival date & time 11/28/15  1608 History   First MD Initiated Contact with Patient 11/28/15 1710     Chief Complaint  Patient presents with  . Vaginal Bleeding   HPI  [name] is a 45 year old female presenting with vaginal bleeding and abdominal pain. She was seen in this emergency departments 10 days ago vaginal bleeding and a positive pregnancy test. Ultrasound at that time showed probable early intrauterine gestational sac with recommendation for follow-up hCG levels and ultrasound. She has not followed up with her OB/GYN or [facility] as directed. She states that the vaginal bleeding seemed to be resolving over the past week and she was only having spotting. Today, she developed heavy vaginal bleeding with clots passing and lower abdominal cramping. The cramping is constant but occasionally increases in intensity. It is mild in severity. He has not taken any medications prior to arrival. Denies fevers, chills, headaches, lightheadedness, dizziness, syncope, nausea, vomiting, diarrhea, dysuria, vaginal discharge or pelvic pain.  Past Medical History  Diagnosis Date  . GERD (gastroesophageal reflux disease)   . HA (headache)   . History of chicken pox   . Migraine    Past Surgical History  Procedure Laterality Date  . No past surgeries    . Cervical spine surgery     Family History  Problem Relation Age of Onset  . Congestive Heart Failure Mother 62    Deceased  . Stroke Mother   . Stroke Brother     multiple [6]  . Hypertension Brother     x3  . Hypertension Sister     x4  . Diabetes Sister   . Diabetes Brother   . Diabetes Mother   . Diabetes Maternal Grandmother   . Breast cancer Maternal Aunt   . Heart attack Maternal Uncle   . Healthy Son     x1  . Healthy Daughter     x2   Social History  Substance Use Topics  . Smoking status: Never Smoker   . Smokeless tobacco: Never Used  . Alcohol Use: 0.0 oz/week    0 Standard drinks or  equivalent per week     Comment: rare    OB History    No data available     Review of Systems  Gastrointestinal: Positive for abdominal pain.  Genitourinary: Positive for vaginal bleeding.  All other systems reviewed and are negative.     Allergies  Vicodin  Home Medications   Prior to Admission medications   Medication Sig Start Date End Date Taking? Authorizing Provider  ondansetron (ZOFRAN ODT) 8 MG disintegrating tablet Take 1 tablet (8 mg total) by mouth every 8 (eight) hours as needed for nausea or vomiting. 06/15/15   [name], MD   BP 116/68 mmHg  Pulse 76  Temp(Src) 98 F (36.7 C) (Oral)  Resp 18  Ht  (1.702 m)  Wt 108.863 kg  BMI 37.58 kg/m2  SpO2 100%  LMP 11/14/2015 Physical Exam  Constitutional: She appears well-developed and well-nourished. No distress.  HENT:  Head: Normocephalic and atraumatic.  Eyes: Conjunctivae are normal. Right eye exhibits no discharge. Left eye exhibits no discharge. No scleral icterus.  Neck: Normal range of motion.  Cardiovascular: Normal rate, regular rhythm and normal heart sounds.   Pulmonary/Chest: Effort normal and breath sounds normal. No respiratory distress.  Abdominal: Soft. Bowel sounds are normal. She exhibits no distension. There is no tenderness. There is no rebound and no guarding.  Genitourinary: There is no rash on the right labia. There is no rash on the left labia. Cervix exhibits no motion tenderness, no discharge and no friability. Right adnexum displays no tenderness and no fullness. Left adnexum displays no tenderness and no fullness. There is bleeding in the vagina. No vaginal discharge found.  Moderate amount of bright red blood in the vaginal canal with few clots. Os appears to be closed. No discharge noted. No CMT or adnexal fullness or tenderness.  Musculoskeletal: Normal range of motion.  Lymphadenopathy:       Right: No inguinal adenopathy present.       Left: No inguinal adenopathy present.   Neurological: She is alert. Coordination normal.  Skin: Skin is warm and dry.  Psychiatric: She has a normal mood and affect. Her behavior is normal.  Nursing note and vitals reviewed.   ED Course  Procedures (including critical care time) Labs Review Labs Reviewed  WET PREP, GENITAL - Abnormal; Notable for the following:    WBC, Wet Prep HPF POC FEW (*)    All other components within normal limits  URINALYSIS, ROUTINE W REFLEX MICROSCOPIC (NOT AT [facility]) - Abnormal; Notable for the following:    APPearance CLOUDY (*)    Hgb urine dipstick LARGE (*)    Ketones, ur 15 (*)    All other components within normal limits  URINE MICROSCOPIC-ADD ON - Abnormal; Notable for the following:    Squamous Epithelial / LPF 0-5 (*)    Bacteria, UA RARE (*)    All other components within normal limits  CBC WITH DIFFERENTIAL/PLATELET - Abnormal; Notable for the following:    WBC 13.0 (*)    RBC 3.53 (*)    Hemoglobin 11.2 (*)    HCT 33.8 (*)    Neutro Abs 9.2 (*)    Monocytes Absolute 1.1 (*)    All other components within normal limits  BASIC METABOLIC PANEL - Abnormal; Notable for the following:    CO2 20 (*)    Glucose, Bld 100 (*)    Calcium 8.5 (*)    All other components within normal limits  PREGNANCY, URINE - Abnormal; Notable for the following:    Preg Test, Ur POSITIVE (*)    All other components within normal limits  HCG, QUANTITATIVE, PREGNANCY - Abnormal; Notable for the following:    hCG, Beta Chain, Quant, S 3600 (*)    All other components within normal limits  GC/CHLAMYDIA PROBE AMP ([place]) NOT AT [facility]    Imaging Review US Ob Comp Less 14 Wks  11/28/2015  CLINICAL DATA:  Continued vaginal bleeding in early pregnancy. Also follow-up from prior ultrasound. Beta HCG, 3,600, previously 2,728. EXAM: OBSTETRIC <14 WK [place] AND TRANSVAGINAL OB US TECHNIQUE: Both transabdominal and transvaginal ultrasound examinations were performed for complete evaluation of the gestation as  well as the maternal uterus, adnexal regions, and pelvic cul-de-sac. Transvaginal technique was performed to assess early pregnancy. COMPARISON:  11/17/2015 FINDINGS: Intrauterine gestational sac: None Yolk sac:  No Embryo:  No Maternal uterus/adnexae: The endometrium is now all widened with heterogeneous echogenicity material. This extends to mildly widened the upper endocervix. Total endometrial thickness, greatest in the lower uterine segment, is 18 mm. No uterine masses. Color Doppler blood flow seen adjacent to the heterogeneous endometrial canal material but no significant flow was seen within it. No uterine masses. Right ovary demonstrates a cyst that may be a corpus luteum measuring approximate 19 mm. The left ovary was not visualized. No  left adnexal masses. No pelvic free fluid. IMPRESSION: 1. Since the prior exam, the small possible gestational sac is no longer visualized. There is now heterogeneous material widening the endometrial stripe extending to the upper endocervix. Findings are consistent with endometrial thrombus in the setting of a failed intrauterine pregnancy. 2. 19 mm right ovarian cysts which may reflect a corpus luteum. Left ovary not visualized. No adnexal masses or pelvic free fluid. Electronically Signed   By: Amie Portland M.D.   On: 11/28/2015 20:13   US Ob Transvaginal  11/28/2015  CLINICAL DATA:  Continued vaginal bleeding in early pregnancy. Also follow-up from prior ultrasound. Beta HCG, 3,600, previously 2,728. EXAM: OBSTETRIC <14 WK [place] AND TRANSVAGINAL OB US TECHNIQUE: Both transabdominal and transvaginal ultrasound examinations were performed for complete evaluation of the gestation as well as the maternal uterus, adnexal regions, and pelvic cul-de-sac. Transvaginal technique was performed to assess early pregnancy. COMPARISON:  11/17/2015 FINDINGS: Intrauterine gestational sac: None Yolk sac:  No Embryo:  No Maternal uterus/adnexae: The endometrium is now all widened with  heterogeneous echogenicity material. This extends to mildly widened the upper endocervix. Total endometrial thickness, greatest in the lower uterine segment, is 18 mm. No uterine masses. Color Doppler blood flow seen adjacent to the heterogeneous endometrial canal material but no significant flow was seen within it. No uterine masses. Right ovary demonstrates a cyst that may be a corpus luteum measuring approximate 19 mm. The left ovary was not visualized. No left adnexal masses. No pelvic free fluid. IMPRESSION: 1. Since the prior exam, the small possible gestational sac is no longer visualized. There is now heterogeneous material widening the endometrial stripe extending to the upper endocervix. Findings are consistent with endometrial thrombus in the setting of a failed intrauterine pregnancy. 2. 19 mm right ovarian cysts which may reflect a corpus luteum. Left ovary not visualized. No adnexal masses or pelvic free fluid. Electronically Signed   By: Amie Portland M.D.   On: 11/28/2015 20:13   I have personally reviewed and evaluated these images and lab results as part of my medical decision-making.   EKG Interpretation None      MDM   Final diagnoses:  Miscarriage   45 year old female presenting with vaginal bleeding and lower abdominal cramping times one day. Patient seen in this emergency department recently with same complaint. Found to be pregnant with ultrasound showing possible early intrauterine pregnancy discharged with diagnosis of early pregnancy versus miscarriage. Patient did not follow up with her OB/GYN as instructed. Patient is hemodynamically stable and in no acute distress. Abdomen is soft, nontender. No peritoneal signs. Moderate amount of bright red blood noted on pelvic exam. Os is closed. No CMT or adnexal tenderness. White blood cell count elevated to 13. Hemoglobin 11.2 which is down from 12 at her last emergency department visit. Urine pregnancy positive with Quant of 3600.  Transvaginal ultrasound shows endometrial thrombus in setting of failed pregnancy. Discussed findings with patient and importance of strict follow-up with her OB/GYN. Patient is to schedule a follow up for a visit in 2-3 days for Hgb check and to ensure improving symptoms. Long discussion about importance of outpatient follow-up. Case discussed with Dr. Judd Lien who agrees with plan to discharge. Return precautions given in discharge paperwork and discussed with pt at bedside. Pt stable for discharge     Alveta Heimlich, PA-C 11/28/15 2137  Geoffery Lyons, MD 11/28/15 [phone]

## 2015-11-30 LAB — GC/CHLAMYDIA PROBE AMP (~~LOC~~) NOT AT ARMC
CHLAMYDIA, DNA PROBE: NEGATIVE
NEISSERIA GONORRHEA: NEGATIVE

## 2016-02-20 DIAGNOSIS — M542 Cervicalgia: Secondary | ICD-10-CM | POA: Diagnosis not present

## 2016-02-29 ENCOUNTER — Ambulatory Visit (INDEPENDENT_AMBULATORY_CARE_PROVIDER_SITE_OTHER): Payer: BLUE CROSS/BLUE SHIELD | Admitting: Physician Assistant

## 2016-02-29 ENCOUNTER — Encounter: Payer: Self-pay | Admitting: *Deleted

## 2016-02-29 ENCOUNTER — Telehealth: Payer: Self-pay | Admitting: Physician Assistant

## 2016-02-29 ENCOUNTER — Encounter: Payer: Self-pay | Admitting: Physician Assistant

## 2016-02-29 VITALS — BP 142/100 | HR 79 | Temp 97.8°F | Resp 16 | Ht 67.0 in | Wt 237.4 lb

## 2016-02-29 DIAGNOSIS — G43709 Chronic migraine without aura, not intractable, without status migrainosus: Secondary | ICD-10-CM

## 2016-02-29 DIAGNOSIS — IMO0002 Reserved for concepts with insufficient information to code with codable children: Secondary | ICD-10-CM

## 2016-02-29 MED ORDER — TOPIRAMATE 25 MG PO TABS: ORAL_TABLET | ORAL | Status: DC

## 2016-02-29 MED ORDER — KETOROLAC TROMETHAMINE 60 MG/2ML IM SOLN
60.0000 mg | Freq: Once | INTRAMUSCULAR | Status: AC
  Administered 2016-02-29: 60 mg via INTRAMUSCULAR

## 2016-02-29 MED ORDER — BUTALBITAL-ASA-CAFFEINE 50-325-40 MG PO CAPS: 1.0000 | ORAL_CAPSULE | Freq: Two times a day (BID) | ORAL | Status: DC | PRN

## 2016-02-29 NOTE — Addendum Note (Signed)
Addended by: Cammy CopaHANDLER, TANESHA N on: 02/29/2016 02:15 PM   Modules accepted: Orders

## 2016-02-29 NOTE — Progress Notes (Signed)
Pre visit review using our clinic review tool, if applicable. No additional management support is needed unless otherwise documented below in the visit note/SLS  

## 2016-02-29 NOTE — Progress Notes (Signed)
Patient presents to clinic today c/o migraine headache over the past 1.5 days. Associated symptoms include nausea, photophobia. Denies fever, chills, vomiting, dizziness. Was previously followed by Neurosurgery for cervical disc disease though to be culprit of headaches. Is s/p laminectomy with improvement in back pain and resolution of numbness, but notes she is still having headaches. Was started on Tizanidine by Neurosurgeon last week but could not tolerate due to vomiting. Was told to follow-up with PCP for further management.  Past Medical History  Diagnosis Date  . GERD (gastroesophageal reflux disease)   . HA (headache)   . History of chicken pox   . Migraine     Current Outpatient Prescriptions on File Prior to Visit  Medication Sig Dispense Refill  . ondansetron (ZOFRAN ODT) 8 MG disintegrating tablet Take 1 tablet (8 mg total) by mouth every 8 (eight) hours as needed for nausea or vomiting. 10 tablet 0   No current facility-administered medications on file prior to visit.    Allergies  Allergen Reactions  . Vicodin [Hydrocodone-Acetaminophen] Nausea And Vomiting    Family History  Problem Relation Age of Onset  . Congestive Heart Failure Mother 54    Deceased  . Stroke Mother   . Stroke Brother     multiple [6]  . Hypertension Brother     x3  . Hypertension Sister     x4  . Diabetes Sister   . Diabetes Brother   . Diabetes Mother   . Diabetes Maternal Grandmother   . Breast cancer Maternal Aunt   . Heart attack Maternal Uncle   . Healthy Son     x1  . Healthy Daughter     x2    Social History   Social History  . Marital Status: Single    Spouse Name: N/A  . Number of Children: N/A  . Years of Education: N/A   Social History Main Topics  . Smoking status: Never Smoker   . Smokeless tobacco: Never Used  . Alcohol Use: 0.0 oz/week    0 Standard drinks or equivalent per week     Comment: rare   . Drug Use: No  . Sexual Activity: No   Other  Topics Concern  . None   Social History Narrative   Review of Systems - See HPI.  All other ROS are negative.  BP 142/100 mmHg  Pulse 79  Temp(Src) 97.8 F (36.6 C) (Oral)  Resp 16  Ht  (1.702 m)  Wt 237 lb 6 oz (107.673 kg)  BMI 37.17 kg/m2  SpO2 98%  LMP 02/08/2016  Physical Exam  Constitutional: She is oriented to person, place, and time and well-developed, well-nourished, and in no distress.  HENT:  Head: Normocephalic and atraumatic.  Eyes: Conjunctivae are normal. Pupils are equal, round, and reactive to light.  Cardiovascular: Normal rate, regular rhythm, normal heart sounds and intact distal pulses.   Pulmonary/Chest: Effort normal and breath sounds normal. No respiratory distress. She has no wheezes. She has no rales. She exhibits no tenderness.  Neurological: She is alert and oriented to person, place, and time.  Skin: Skin is warm and dry.  Vitals reviewed.  Assessment/Plan: 1. Chronic migraine IM Toradol given today to abort headache. Patient to start Topamax for prevention. Dietary measures discussed. Fiorinal for PO abortive therapy at home. Referral to Headache clinic placed. She is to follow-up with me in 3-4 weeks if she has not seen specialist.   - topiramate (TOPAMAX) 25 MG tablet; Take  1 tablet by mouth daily for 1 week. Then increase to 1 tablet twice daily  Dispense: 60 tablet; Refill: 1 - butalbital-aspirin-caffeine (FIORINAL) 50-325-40 MG capsule; Take 1 capsule by mouth 2 (two) times daily as needed for headache.  Dispense: 14 capsule; Refill: 0 - AMB referral to headache clinic

## 2016-02-29 NOTE — Patient Instructions (Signed)
Please start the Topamax as directed. This will begin to help cut down on frequency of headaches and severity. As we titrate medication dose, we will hopefull fully prevent these headaches. Use the Fiorinal as directed to get rid of acute headaches. Stay well hydrated and do not skip meals.  You will be contacted by Headache Clinic for further management.  If you do not see them within 3-4 weeks, follow-up with me in office. Return sooner if needed.

## 2016-02-29 NOTE — Telephone Encounter (Signed)
Candise BowensJen can you look at this and see what other options we have?

## 2016-02-29 NOTE — Telephone Encounter (Signed)
Relation to EA:VWUJpt:self Call back number:[phone]   Reason for call:  Patient states neurologist cant see her until September and would like to be seen as soon as possible.

## 2016-03-01 NOTE — Telephone Encounter (Signed)
Going to try another office to see if we can get her in sooner. Patient has not started meds, is starting today. Will need a letter for work stating when she can return.

## 2016-03-01 NOTE — Telephone Encounter (Signed)
Assess if she has gone back to work. We can right letter extending previous note from her specialist. Is she still sending me short-term disability paperwork?

## 2016-03-01 NOTE — Telephone Encounter (Signed)
Can try another office, left message on vm with patient, awaiting return call

## 2016-03-02 ENCOUNTER — Telehealth: Payer: Self-pay | Admitting: Physician Assistant

## 2016-03-02 NOTE — Telephone Encounter (Signed)
Pt dropped off short term disability paperwork, documents placed in Jessica's tray at front office, pt requesting documents be faxed

## 2016-03-02 NOTE — Telephone Encounter (Signed)
Ok to write letter

## 2016-03-02 NOTE — Telephone Encounter (Signed)
Patient returned call the following information was asked to patient due to nurse being unavailable, patient states she  has not gone back to work, extension needed until 03/09/16 due to the fact [facility] is reviewing records before scheduling patient. Patient would like to pick up letter today and will drop off short term disability papers.

## 2016-03-02 NOTE — Telephone Encounter (Signed)
Letter printed and filed at front desk for pick up. Pt notified.

## 2016-03-02 NOTE — Telephone Encounter (Signed)
Okay to write note to return to work on 03/09/16?

## 2016-03-02 NOTE — Telephone Encounter (Signed)
Called pt and LMOM to return call.

## 2016-03-07 NOTE — Telephone Encounter (Signed)
Pt called in because she says that she has her appt on Tuesday, June 13th. Pt says that she wouldn't need to return to work and then be out again (depending on neurologist says). Pt is requesting a call back to be advise further.      CB: [phone]

## 2016-03-07 NOTE — Telephone Encounter (Signed)
Returned call, pt was indeed wanting extension of work note. Letter written and filed at front desk for pick up.

## 2016-03-07 NOTE — Telephone Encounter (Signed)
Call her to assess further. Is she wanting an extension of her work note I am assuming? If so ok to cover through 03/13/16 for her appointment. Further work notes would need to come from specialist.

## 2016-03-12 ENCOUNTER — Telehealth: Payer: Self-pay | Admitting: Physician Assistant

## 2016-03-12 NOTE — Telephone Encounter (Signed)
I will fill out FMLA. I have written work note through appointment with Neurology tomorrow. Will be up to them to extend her work note further.

## 2016-03-12 NOTE — Telephone Encounter (Signed)
I have mostly completed FMLA. Need to know her planned return to work date. Please find this out for me so I can finish paperwork.

## 2016-03-12 NOTE — Telephone Encounter (Signed)
Pt stated that her planned returned to work date is Monday 04/09/16. She states that she has a lot going on and her headaches are not getting better. States her niece died last night and had 4 children the family is trying to help.  She needs a work note sent as well. Fax # [phone] to her work.

## 2016-03-12 NOTE — Telephone Encounter (Signed)
LMOM with contact name and number for return call RE: RTW date per provider instructions/SLS 06/12

## 2016-03-12 NOTE — Telephone Encounter (Signed)
Spoke with pt and notified her of below. She voices understanding and states employer has already received her work note through tomorrow. We will just need to fax FMLA to the # on her papers once it is completed.

## 2016-03-13 ENCOUNTER — Telehealth: Payer: Self-pay | Admitting: Physician Assistant

## 2016-03-13 ENCOUNTER — Encounter: Payer: Self-pay | Admitting: Neurology

## 2016-03-13 ENCOUNTER — Ambulatory Visit (INDEPENDENT_AMBULATORY_CARE_PROVIDER_SITE_OTHER): Payer: BLUE CROSS/BLUE SHIELD | Admitting: Neurology

## 2016-03-13 VITALS — BP 153/90 | HR 80 | Ht 67.0 in | Wt 241.0 lb

## 2016-03-13 DIAGNOSIS — E669 Obesity, unspecified: Secondary | ICD-10-CM

## 2016-03-13 DIAGNOSIS — R51 Headache: Secondary | ICD-10-CM | POA: Diagnosis not present

## 2016-03-13 DIAGNOSIS — G43709 Chronic migraine without aura, not intractable, without status migrainosus: Secondary | ICD-10-CM

## 2016-03-13 DIAGNOSIS — R519 Headache, unspecified: Secondary | ICD-10-CM

## 2016-03-13 DIAGNOSIS — R0683 Snoring: Secondary | ICD-10-CM

## 2016-03-13 DIAGNOSIS — G43109 Migraine with aura, not intractable, without status migrainosus: Secondary | ICD-10-CM | POA: Insufficient documentation

## 2016-03-13 DIAGNOSIS — IMO0002 Reserved for concepts with insufficient information to code with codable children: Secondary | ICD-10-CM

## 2016-03-13 MED ORDER — TOPIRAMATE 25 MG PO TABS: ORAL_TABLET | ORAL | Status: DC

## 2016-03-13 MED ORDER — SUMATRIPTAN SUCCINATE 100 MG PO TABS: 100.0000 mg | ORAL_TABLET | Freq: Once | ORAL | Status: DC

## 2016-03-13 NOTE — Progress Notes (Signed)
[facility]    Provider:  Dr Lucia Gaskins Referring Provider: [name], PA-C Primary Care Physician:  Piedad Climes, PA-C  CC:  Migraines  HPI:  Kim Macdonald is a 45 y.o. female here as a referral from Dr. Daphine Deutscher for headaches. Here with her partner. Started last Sept or October. No inciting events or head trauma. Sister has migraines. She feels like someone is pounding her in her head. Unilateral but shifts sides, her neck and shoulder hurt. She gets lightheaded, dizzy, nauseated, her left side is always numb and heavy and weak.The left sided numbness/weakness started last Sept or October and may not be associated with headaches. The headaches can be severe. 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  She sees spots and lights sometimes but not always before the headaches. They are pounding. No light sensitivity or sound, she has nausea and sometimes vomits with the headaches. She "jumps" all night, wakes her up. She snores loud and wakes up with headaches. She is tired during the day. She is off balance a lot.Headaches are every day for up to all day. She sometimes tried fioricet but it doesn't help. No side effects to the Topamax. No other focal neurologic deficits, signs or symptoms. Discussed stop the Fioricet as it can cause rebound headaches. Discussed stopping the Fioricet as a cause rebound headaches.  Reviewed notes, labs and imaging from outside physicians, which showed: Personally reviewed images and agree with the following: MRI of the brain October 2016: There is no evidence of acute infarct, intracranial hemorrhage, mass, midline shift, or extra-axial fluid collection. Ventricles and sulci are normal. Minimal scattered, punctate foci of T2 hyperintensity in the subcortical cerebral white matter do not appear significantly changed in number although were overall more conspicuous on the prior study. No periventricular, corpus callosum,  brainstem, or cerebellar lesions are seen. No abnormal enhancement is seen.  Orbits are unremarkable. Paranasal sinuses and mastoid air cells are clear. Major intracranial vascular flow voids are preserved.  IMPRESSION: 1. No acute intracranial abnormality or mass. 2. Minimal nonspecific cerebral white matter changes.   Patient reports headache started last October. Reviewed records in Epic starting at that time. In November 2016 patient was having frequent headaches and neck pain into the upper extremities with tingling and numbness. 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 MRI of the cervical spine showed a bulging disc at C5-C6  and she was referred to neurosurgery. Patient was seen again in May of this year complaining of migraine headaches. Associated symptoms nausea, photophobia. No fever and no chills no vomiting or dizziness. She is status post laminectomy with improvement in back pain and resolution of numbness but headaches persisted. She was started on tizanidine by a neurosurgeon but couldn't tolerate it. She was started on Topamax for preventative management and Fioricet for acute management. And she was referred to neurology.    Review of Systems: Patient complains of symptoms per HPI as well as the following symptoms: Blurred vision, easy bruising, shortness of breath, snoring, feeling hot, increased thirst, headache, numbness, weakness, dizziness, snoring, shift 4, restless legs, not enough sleep, decreased energy. Pertinent negatives per HPI. All others negative. Patient was started on Topamax for prevention. Fioricet for by mouth abortive therapy at home. Referral to headache clinic was placed.   Social History   Social History  . Marital Status: Single    Spouse Name: N/A  . Number of Children: N/A  . Years of  Education: 12   Occupational History  . Not on file.   Social History Main Topics  . Smoking status: Never Smoker   .  Smokeless tobacco: Never Used  . Alcohol Use: 0.0 oz/week    0 Standard drinks or equivalent per week     Comment: rare   . Drug Use: No  . Sexual Activity: No   Other Topics Concern  . Not on file   Social History Narrative   Lives w/ sister   Caffeine use:  Tea and soda- 2-3 per day    Family History  Problem Relation Age of Onset  . Congestive Heart Failure Mother [id number]    Deceased  . Stroke Mother   . Diabetes Mother   . Stroke Brother     multiple   . Hypertension Brother     x3  . Hypertension Sister     x4  . Migraines Sister   . Diabetes Sister   . Diabetes Brother   . Diabetes Maternal Grandmother   . Breast cancer Maternal Aunt   . Heart attack Maternal Uncle   . Healthy Son     x1  . Healthy Daughter     x2    Past Medical History  Diagnosis Date  . GERD (gastroesophageal reflux disease)   . HA (headache)   . History of chicken pox   . Migraine     Past Surgical History  Procedure Laterality Date  . No past surgeries    . Cervical spine surgery      Current Outpatient Prescriptions  Medication Sig Dispense Refill  . butalbital-aspirin-caffeine (FIORINAL) 50-325-40 MG capsule Take 1 capsule by mouth 2 (two) times daily as needed for headache. 14 capsule 0  . ondansetron (ZOFRAN ODT) 8 MG disintegrating tablet Take 1 tablet (8 mg total) by mouth every 8 (eight) hours as needed for nausea or vomiting. 10 tablet 0  . topiramate (TOPAMAX) 25 MG tablet Increase by one pill nightly until taking 100mg  at night (4 pills) 120 tablet 12  . SUMAtriptan (IMITREX) 100 MG tablet Take 1 tablet (100 mg total) by mouth once. May repeat in 2 hours if headache persists or recurs. 10 tablet 12   No current facility-administered medications for this visit.    Allergies as of 03/13/2016 - Review Complete 03/13/2016  Allergen Reaction Noted  . Vicodin [hydrocodone-acetaminophen] Nausea And Vomiting 06/15/2015    Vitals: BP 153/90 mmHg  Pulse 80  Ht 5\' 7"  (1.702  m)  Wt 241 lb (109.317 kg)  BMI 37.74 kg/m2  LMP 02/08/2016 Last Weight:  Wt Readings from Last 1 Encounters:  03/13/16 241 lb (109.317 kg)   Last Height:   Ht Readings from Last 1 Encounters:  03/13/16 5\' 7"  (1.702 m)    Physical exam: Exam: Gen: NAD, conversant, well nourised, obese, well groomed                     CV: RRR, no MRG. No Carotid Bruits. No peripheral edema, warm, nontender Eyes: Conjunctivae clear without exudates or hemorrhage  Neuro: Detailed Neurologic Exam  Speech:    Speech is normal; fluent and spontaneous with normal comprehension.  Cognition:    The patient is oriented to person, place, and time;     recent and remote memory intact;     language fluent;     normal attention, concentration,     fund of knowledge Cranial Nerves:    The pupils are equal, round, and  reactive to light. The fundi are normal and spontaneous venous pulsations are present. Visual fields are full to finger confrontation. Extraocular movements are intact. Trigeminal sensation is intact and the muscles of mastication are normal. The face is symmetric. The palate elevates in the midline. Hearing intact. Voice is normal. Shoulder shrug is normal. The tongue has normal motion without fasciculations.   Coordination:    Normal finger to nose and heel to shin. Normal rapid alternating movements.   Gait:    Heel-toe and tandem gait are normal.   Motor Observation:    No asymmetry, no atrophy, and no involuntary movements noted. Tone:    Normal muscle tone.    Posture:    Posture is normal. normal erect    Strength:    Strength is V/V in the upper and lower limbs.      Sensation: intact to LT     Reflex Exam:  DTR's:    Deep tendon reflexes in the upper and lower extremities are normal bilaterally.   Toes:    The toes are downgoing bilaterally.   Clonus:    Clonus is absent.      Assessment/Plan:  45 year old female with chronic migraines without status not  intractable.  Significant snoring, periodic limb movements of sleep, morning headaches; Sleep evaluation Preventative medication: Will increase her Topamax 100 mg at night. May need to increase it even further. Acute management: At onset take Imitrex. May repeat in 2 hours. May take with the Zofran for nausea. Do not take more than twice in 1 day or 2-3 days a week. MRI of the brain was unremarkable last year, no need for repeat since she is having similar symptoms. If symptoms do not improve in the next several months on new  regimen I've asked her to call me for repeat MRI of the brain or come back to clinic sooner. We'll repeat CMP today for updated labs and LFTs as well This is my first time seeing patient, I can't fill out any disability forms for her as she requests, I have modified her migraine management so hopefully she will start feeling better. I recommend close follow-up with primary care for vascular risk factors. Recommend weight loss and diet. Education on lifestyle changes, migraine triggers, and headache diary.  To prevent or relieve headaches, try the following: Cool Compress. Lie down and place a cool compress on your head.  Avoid headache triggers. If certain foods or odors seem to have triggered your migraines in the past, avoid them. A headache diary might help you identify triggers.  Include physical activity in your daily routine. Try a daily walk or other moderate aerobic exercise.  Manage stress. Find healthy ways to cope with the stressors, such as delegating tasks on your to-do list.  Practice relaxation techniques. Try deep breathing, yoga, massage and visualization.  Eat regularly. Eating regularly scheduled meals and maintaining a healthy diet might help prevent headaches. Also, drink plenty of fluids.  Follow a regular sleep schedule. Sleep deprivation might contribute to headaches Consider biofeedback. With this mind-body technique, you learn to control certain  bodily functions - such as muscle tension, heart rate and blood pressure - to prevent headaches or reduce headache pain.    Proceed to emergency room if you experience new or worsening symptoms or symptoms do not resolve, if you have new neurologic symptoms or if headache is severe, or for any concerning symptom.    Discussed following side effects to medication as well as provided and  pointing out the literature I gave her on the AVS for a complete list of side effects for her to read:  This medication may cause birth defects do not get pregnant and use backup birth control.  Discussed side effects. Serious side effects can inclue: Abdominal or stomach pain ,fever, chills, or sore throat , lessening of sensations or perception ,loss of appetite , mood or mental changes, including aggression, agitation, apathy, irritability, and mental depression , red, irritated, or bleeding gums , weight loss Rare Blood in the urine , decrease in sexual performance or desire , difficult or painful urination , frequent urination , hearing loss , loss of bladder control , lower back or side pain , nosebleeds , pale skin , red or irritated eyes , ringing or buzzing in the ears , skin rash or itching , swelling , trouble breathing, Common side effects of Topamax include:  tiredness, drowsiness, dizziness, nervousness, numbness or tingly feeling, coordination problems, diarrhea, weight loss, speech/language problems, changes in vision, sensory distortion, loss of appetite, bad taste in your mouth, confusion, slowed thinking, trouble concentrating or paying attention, memory problems,   The most common side-effects to triptans are feeling sick (nausea), dizziness and dry mouth. In addition, triptans can also cause some people to experience strange sensations. These may be a tightness, tingling, flushing, and feelings of heaviness or pressure in areas such as the face and limbs, and occasionally the chest. Serious side effects  can include stroke, cardiac side effects such as chest tightness, shortness of breath and possible cardiovascular adverse effects.    Naomie Dean, MD  [facility] [address] [place], [place] [address]  Phone [phone] Fax [phone]

## 2016-03-13 NOTE — Patient Instructions (Addendum)
Overall you are doing fairly well but I do want to suggest a few things today:   Remember to drink plenty of fluid, eat healthy meals and do not skip any meals. Try to eat protein with a every meal and eat a healthy snack such as fruit or nuts in between meals. Try to keep a regular sleep-wake schedule and try to exercise daily, particularly in the form of walking, 20-30 minutes a day, if you can.   As far as your medications are concerned, I would like to suggest: Increase Topiramate to 100mg  at bedtime At onset take Imitrex. May repeat in 2 hours. May take with the Zofran for nausea. Do not take more than twice in 1 day or 2-3 days a week. May take with Zofran for nausea  As far as diagnostic testing: One lab today, sleep eval  I would like to see you back in 3 months, sooner if we need to. Please call us with any interim questions, concerns, problems, updates or refill requests.   Our phone number is [phone]. We also have an after hours call service for urgent matters and there is a physician on-call for urgent questions. For any emergencies you know to call 911 or go to the nearest  emergency room Sumatriptan tablets What is this medicine? SUMATRIPTAN (soo ma TRIP tan) is used to treat migraines with or without aura. An aura is a strange feeling or visual disturbance that warns you of an attack. It is not used to prevent migraines. This medicine may be used for other purposes; ask your health care provider or pharmacist if you have questions. What should I tell my health care provider before I take this medicine? They need to know if you have any of these conditions: -circulation problems in fingers and toes -diabetes -heart disease -high blood pressure -high cholesterol -history of irregular heartbeat -history of stroke -kidney disease -liver disease -postmenopausal or surgical removal of uterus and ovaries -seizures -smoke tobacco -stomach or intestine problems -an unusual  or allergic reaction to sumatriptan, other medicines, foods, dyes, or preservatives -pregnant or trying to get pregnant -breast-feeding How should I use this medicine? Take this medicine by mouth with a glass of water. Follow the directions on the prescription label. This medicine is taken at the first symptoms of a migraine. It is not for everyday use. If your migraine headache returns after one dose, you can take another dose as directed. You must leave at least 2 hours between doses, and do not take more than 100 mg as a single dose. Do not take more than 200 mg total in any 24 hour period. If there is no improvement at all after the first dose, do not take a second dose without talking to your doctor or health care professional. Do not take your medicine more often than directed. Talk to your pediatrician regarding the use of this medicine in children. Special care may be needed. Overdosage: If you think you have taken too much of this medicine contact a poison control center or emergency room at once. NOTE: This medicine is only for you. Do not share this medicine with others. What if I miss a dose? This does not apply; this medicine is not for regular use. What may interact with this medicine? Do not take this medicine with any of the following medicines: -cocaine -ergot alkaloids like dihydroergotamine, ergonovine, ergotamine, methylergonovine -feverfew -MAOIs like Carbex, Eldepryl, Marplan, Nardil, and Parnate -other medicines for migraine headache like almotriptan, eletriptan,  frovatriptan, naratriptan, rizatriptan, zolmitriptan -tryptophan This medicine may also interact with the following medications: -certain medicines for depression, anxiety, or psychotic disturbances This list may not describe all possible interactions. Give your health care provider a list of all the medicines, herbs, non-prescription drugs, or dietary supplements you use. Also tell them if you smoke, drink  alcohol, or use illegal drugs. Some items may interact with your medicine. What should I watch for while using this medicine? Only take this medicine for a migraine headache. Take it if you get warning symptoms or at the start of a migraine attack. It is not for regular use to prevent migraine attacks. You may get drowsy or dizzy. Do not drive, use machinery, or do anything that needs mental alertness until you know how this medicine affects you. To reduce dizzy or fainting spells, do not sit or stand up quickly, especially if you are an older patient. Alcohol can increase drowsiness, dizziness and flushing. Avoid alcoholic drinks. Smoking cigarettes may increase the risk of heart-related side effects from using this medicine. If you take migraine medicines for 10 or more days a month, your migraines may get worse. Keep a diary of headache days and medicine use. Contact your healthcare professional if your migraine attacks occur more frequently. What side effects may I notice from receiving this medicine? Side effects that you should report to your doctor or health care professional as soon as possible: -allergic reactions like skin rash, itching or hives, swelling of the face, lips, or tongue -bloody or watery diarrhea -hallucination, loss of contact with reality -pain, tingling, numbness in the face, hands, or feet -seizures -signs and symptoms of a blood clot such as breathing problems; changes in vision; chest pain; severe, sudden headache; pain, swelling, warmth in the leg; trouble speaking; sudden numbness or weakness of the face, arm, or leg -signs and symptoms of a dangerous change in heartbeat or heart rhythm like chest pain; dizziness; fast or irregular heartbeat; palpitations, feeling faint or lightheaded; falls; breathing problems -signs and symptoms of a stroke like changes in vision; confusion; trouble speaking or understanding; severe headaches; sudden numbness or weakness of the face,  arm, or leg; trouble walking; dizziness; loss of balance or coordination -stomach pain Side effects that usually do not require medical attention (report these to your doctor or health care professional if they continue or are bothersome): -changes in taste -facial flushing -headache -muscle cramps -muscle pain -nausea, vomiting -weak or tired This list may not describe all possible side effects. Call your doctor for medical advice about side effects. You may report side effects to FDA at 1-800-FDA-1088. Where should I keep my medicine? Keep out of the reach of children. Store at room temperature between 2 and 30 degrees C (36 and 86 degrees F). Throw away any unused medicine after the expiration date. NOTE: This sheet is a summary. It may not cover all possible information. If you have questions about this medicine, talk to your doctor, pharmacist, or health care provider.    2016, [employer] Standard. (2015-03-24 17:46:40)   Topiramate tablets What is this medicine? TOPIRAMATE (toe PYRE a mate) is used to treat seizures in adults or children with epilepsy. It is also used for the prevention of migraine headaches. This medicine may be used for other purposes; ask your health care provider or pharmacist if you have questions. What should I tell my health care provider before I take this medicine? They need to know if you have any of these conditions: -  bleeding disorders -cirrhosis of the liver or liver disease -diarrhea -glaucoma -kidney stones or kidney disease -low blood counts, like low white cell, platelet, or red cell counts -lung disease like asthma, obstructive pulmonary disease, emphysema -metabolic acidosis -on a ketogenic diet -schedule for surgery or a procedure -suicidal thoughts, plans, or attempt; a previous suicide attempt by you or a family member -an unusual or allergic reaction to topiramate, other medicines, foods, dyes, or preservatives -pregnant or trying  to get pregnant -breast-feeding How should I use this medicine? Take this medicine by mouth with a glass of water. Follow the directions on the prescription label. Do not crush or chew. You may take this medicine with meals. Take your medicine at regular intervals. Do not take it more often than directed. Talk to your pediatrician regarding the use of this medicine in children. Special care may be needed. While this drug may be prescribed for children as young as 104 years of age for selected conditions, precautions do apply. Overdosage: If you think you have taken too much of this medicine contact a poison control center or emergency room at once. NOTE: This medicine is only for you. Do not share this medicine with others. What if I miss a dose? If you miss a dose, take it as soon as you can. If your next dose is to be taken in less than 6 hours, then do not take the missed dose. Take the next dose at your regular time. Do not take double or extra doses. What may interact with this medicine? Do not take this medicine with any of the following medications: -probenecid This medicine may also interact with the following medications: -acetazolamide -alcohol -amitriptyline -aspirin and aspirin-like medicines -birth control pills -certain medicines for depression -certain medicines for seizures -certain medicines that treat or prevent blood clots like warfarin, enoxaparin, dalteparin, apixaban, dabigatran, and rivaroxaban -digoxin -hydrochlorothiazide -lithium -medicines for pain, sleep, or muscle relaxation -metformin -methazolamide -NSAIDS, medicines for pain and inflammation, like ibuprofen or naproxen -pioglitazone -risperidone This list may not describe all possible interactions. Give your health care provider a list of all the medicines, herbs, non-prescription drugs, or dietary supplements you use. Also tell them if you smoke, drink alcohol, or use illegal drugs. Some items may interact  with your medicine. What should I watch for while using this medicine? Visit your doctor or health care professional for regular checks on your progress. Do not stop taking this medicine suddenly. This increases the risk of seizures if you are using this medicine to control epilepsy. Wear a medical identification bracelet or chain to say you have epilepsy or seizures, and carry a card that lists all your medicines. This medicine can decrease sweating and increase your body temperature. Watch for signs of deceased sweating or fever, especially in children. Avoid extreme heat, hot baths, and saunas. Be careful about exercising, especially in hot weather. Contact your health care provider right away if you notice a fever or decrease in sweating. You should drink plenty of fluids while taking this medicine. If you have had kidney stones in the past, this will help to reduce your chances of forming kidney stones. If you have stomach pain, with nausea or vomiting and yellowing of your eyes or skin, call your doctor immediately. You may get drowsy, dizzy, or have blurred vision. Do not drive, use machinery, or do anything that needs mental alertness until you know how this medicine affects you. To reduce dizziness, do not sit or stand up quickly,  especially if you are an older patient. Alcohol can increase drowsiness and dizziness. Avoid alcoholic drinks. If you notice blurred vision, eye pain, or other eye problems, seek medical attention at once for an eye exam. The use of this medicine may increase the chance of suicidal thoughts or actions. Pay special attention to how you are responding while on this medicine. Any worsening of mood, or thoughts of suicide or dying should be reported to your health care professional right away. This medicine may increase the chance of developing metabolic acidosis. If left untreated, this can cause kidney stones, bone disease, or slowed growth in children. Symptoms include  breathing fast, fatigue, loss of appetite, irregular heartbeat, or loss of consciousness. Call your doctor immediately if you experience any of these side effects. Also, tell your doctor about any surgery you plan on having while taking this medicine since this may increase your risk for metabolic acidosis. Birth control pills may not work properly while you are taking this medicine. Talk to your doctor about using an extra method of birth control. Women who become pregnant while using this medicine may enroll in the [place] [employer] Antiepileptic Drug Pregnancy Registry by calling [phone]. This registry collects information about the safety of antiepileptic drug use during pregnancy. What side effects may I notice from receiving this medicine? Side effects that you should report to your doctor or health care professional as soon as possible: -allergic reactions like skin rash, itching or hives, swelling of the face, lips, or tongue -decreased sweating and/or rise in body temperature -depression -difficulty breathing, fast or irregular breathing patterns -difficulty speaking -difficulty walking or controlling muscle movements -hearing impairment -redness, blistering, peeling or loosening of the skin, including inside the mouth -tingling, pain or numbness in the hands or feet -unusual bleeding or bruising -unusually weak or tired -worsening of mood, thoughts or actions of suicide or dying Side effects that usually do not require medical attention (report to your doctor or health care professional if they continue or are bothersome): -altered taste -back pain, joint or muscle aches and pains -diarrhea, or constipation -headache -loss of appetite -nausea -stomach upset, indigestion -tremors This list may not describe all possible side effects. Call your doctor for medical advice about side effects. You may report side effects to FDA at 1-800-FDA-1088. Where should I keep my  medicine? Keep out of the reach of children. Store at room temperature between 15 and 30 degrees C (59 and 86 degrees F) in a tightly closed container. Protect from moisture. Throw away any unused medicine after the expiration date. NOTE: This sheet is a summary. It may not cover all possible information. If you have questions about this medicine, talk to your doctor, pharmacist, or health care provider.    2016, [employer] Standard. (2013-09-21 23:17:57)

## 2016-03-13 NOTE — Telephone Encounter (Signed)
Pt called in requesting call by 2:30pm. I advised pt msg was sent and she will get call back regarding f/u on appt but that I do not know what time it will be.

## 2016-03-13 NOTE — Telephone Encounter (Signed)
Will fax today.Thank you.

## 2016-03-13 NOTE — Telephone Encounter (Signed)
Pt called in to update CMA and PCP on her neurology appt. She says that she was told to call in.    CB: [phone]

## 2016-03-13 NOTE — Telephone Encounter (Signed)
Called and spoke with the pt and she wanted to know if her FMLA forms have been sent in.  Informed the pt per Jasmine DecemberSharon that her FMLA forms have been faxed in.  Pt verbalized understanding.//AB/CMA

## 2016-03-14 ENCOUNTER — Telehealth: Payer: Self-pay | Admitting: *Deleted

## 2016-03-14 LAB — COMPREHENSIVE METABOLIC PANEL
ALT: 18 IU/L (ref 0–32)
AST: 20 IU/L (ref 0–40)
Albumin/Globulin Ratio: 1.3 (ref 1.2–2.2)
Albumin: 4 g/dL (ref 3.5–5.5)
Alkaline Phosphatase: 90 IU/L (ref 39–117)
BUN/Creatinine Ratio: 10 (ref 9–23)
BUN: 9 mg/dL (ref 6–24)
Bilirubin Total: 0.3 mg/dL (ref 0.0–1.2)
CALCIUM: 9.2 mg/dL (ref 8.7–10.2)
CO2: 21 mmol/L (ref 18–29)
Chloride: 104 mmol/L (ref 96–106)
Creatinine, Ser: 0.91 mg/dL (ref 0.57–1.00)
GFR, EST AFRICAN AMERICAN: 89 mL/min/{1.73_m2} (ref >59)
GFR, EST NON AFRICAN AMERICAN: 77 mL/min/{1.73_m2} (ref >59)
GLUCOSE: 115 mg/dL — AB (ref 65–99)
Globulin, Total: 3 g/dL (ref 1.5–4.5)
POTASSIUM: 4.9 mmol/L (ref 3.5–5.2)
Sodium: 143 mmol/L (ref 134–144)
TOTAL PROTEIN: 7 g/dL (ref 6.0–8.5)

## 2016-03-14 NOTE — Telephone Encounter (Addendum)
Relation to WU:JWJXpt:self Call back number:[phone]   Reason for call:  Patient would to know her return date and stated she does not feel ready to go back to work as of right now.

## 2016-03-14 NOTE — Telephone Encounter (Signed)
Her return date was 04/09/16 per previous discussion. Any further FMLA for this issue will have to come from Neurology as they are not treating.

## 2016-03-14 NOTE — Telephone Encounter (Signed)
Patient states return date has to be reflected on letter and faxed to employer. Patient would like to speak with nurse directly,

## 2016-03-14 NOTE — Telephone Encounter (Signed)
-----   Message from Antonia B Ahern, MD sent at 03/14/2016  1:14 PM EDT ----- Labs unremarkable thanks 

## 2016-03-14 NOTE — Telephone Encounter (Signed)
Called and spoke to pt about unremarkable labs per Dr Ahern. Pt verbalized understanding. 

## 2016-03-14 NOTE — Telephone Encounter (Signed)
Ok to write work note for patient reflecting RTW date.

## 2016-03-14 NOTE — Telephone Encounter (Signed)
Spoke with patient and informed her that provider will be back in office on Friday, 03/16/16 to address request for separate work note for employer/SLS 06/14

## 2016-03-15 ENCOUNTER — Ambulatory Visit (INDEPENDENT_AMBULATORY_CARE_PROVIDER_SITE_OTHER): Payer: BLUE CROSS/BLUE SHIELD | Admitting: Neurology

## 2016-03-15 ENCOUNTER — Encounter: Payer: Self-pay | Admitting: Neurology

## 2016-03-15 VITALS — BP 144/98 | HR 76 | Resp 20 | Ht 67.0 in | Wt 234.0 lb

## 2016-03-15 DIAGNOSIS — G4730 Sleep apnea, unspecified: Secondary | ICD-10-CM

## 2016-03-15 DIAGNOSIS — R0689 Other abnormalities of breathing: Secondary | ICD-10-CM | POA: Diagnosis not present

## 2016-03-15 DIAGNOSIS — R0683 Snoring: Secondary | ICD-10-CM | POA: Insufficient documentation

## 2016-03-15 DIAGNOSIS — R351 Nocturia: Secondary | ICD-10-CM | POA: Diagnosis not present

## 2016-03-15 DIAGNOSIS — G4726 Circadian rhythm sleep disorder, shift work type: Secondary | ICD-10-CM | POA: Diagnosis not present

## 2016-03-15 DIAGNOSIS — G4710 Hypersomnia, unspecified: Secondary | ICD-10-CM | POA: Insufficient documentation

## 2016-03-15 DIAGNOSIS — R519 Headache, unspecified: Secondary | ICD-10-CM | POA: Insufficient documentation

## 2016-03-15 DIAGNOSIS — R51 Headache: Secondary | ICD-10-CM

## 2016-03-15 NOTE — Telephone Encounter (Signed)
Work note faxed as requested. Fax confirmation received.

## 2016-03-15 NOTE — Progress Notes (Signed)
[facility]    Provider:  Dr Lucia Gaskins Referring Provider: [name], PA-C Primary Care Physician:  Piedad Climes, PA-C Naomie Dean, MD  CC:  Migraines, possible sleep apnea.  Sleep medicine consultation requested by my colleague Dr. Lucia Gaskins, as described below Mrs. Faraone has a history of snoring and wakes often up with a headache, she also feels tired during the day. She had daily headaches and she was first evaluated but had no other neurologic effects. The patient is seen year today for in the presence of her fianc, who reports that he has witnessed her to snore and that snoring may be louder when she's on her back but he is not sure that he has ever witnessed frank apneas. He also reported that there were periods of insomnia and Mrs. Konopka struggled to fall asleep after going to bed sometimes until the morning hours. She estimated that she got less than 5 or 6 hours of sleep at night for that period of time.  Her current sleep habits are as follows. The patient works second shift and usually comes home after work at about 2:00 in the morning. She feels ready to go to bed between 4 and 5 AM, the bedroom is described as cool, quiet and not dark. She she mixes TV in the bedroom to have a background , she likes the TV screen running but not necessarily for sound. Sometimes she wakes up abruptly out of sleep and almost jumpy feeling that she cannot breathe. She is actually using a TV screen in the background -runnig all night  in her bedroom . She rises in the early afternoon, usually spends about 7 hours in bed, her sleep is interrupted more than 3 times at night by nocturia, and sometimes she feels headaches when she is up to go to the bathroom this seems to be always a headache present in the morning hours. The patient falls asleep on her side, she sleeps on 2 pillows. She has hip and leg pain as well as shoulder pain which sometimes makes it difficult to stay asleep  on the side, the pain is concentrated around the left body. She underwent a spinal anterior fusion of the cervical spine under the guidance of Dr. Yetta Barre in 11- 23-2016,    HPI:  Dr Trevor Mace note : Blima Jaimes is a 45 y.o. female here as a referral from [place]  [place] for headaches, previously seen by Shon Millet, DO . Here with her live in partner. Started last Sept or October. No inciting events or head trauma. Sister has migraines. She feels like someone is pounding her in her head. Unilateral but shifts sides, her neck and shoulder hurt. She gets lightheaded, dizzy, nauseated, her left side is always numb and heavy and weak.The left sided numbness/weakness started last Sept or October and may not be associated with headaches. The headaches can be severe. 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  She sees spots and lights sometimes but not always before the headaches. They are pounding. No light sensitivity or sound, she has nausea and sometimes vomits with the headaches. She "jumps" all night, wakes her up. She snores loud and wakes up with headaches. She is tired during the day. She is off balance a lot.Headaches are every day for up to all day. She sometimes tried fioricet but it doesn't help. No side effects to the Topamax. No other focal neurologic deficits, signs or symptoms. Discussed stop the Fioricet as  it can cause rebound headaches. Discussed stopping the Fioricet as a cause rebound headaches.  Reviewed notes, labs and imaging from outside physicians, which showed: Personally reviewed images and agree with the following: MRI of the brain October 2016: There is no evidence of acute infarct, intracranial hemorrhage, mass, midline shift, or extra-axial fluid collection. Ventricles and sulci are normal. Minimal scattered, punctate foci of T2 hyperintensity in the subcortical cerebral white matter do not appear significantly changed in number although were overall more  conspicuous on the prior study. No periventricular, corpus callosum, brainstem, or cerebellar lesions are seen. No abnormal enhancement is seen.Orbits are unremarkable. Paranasal sinuses and mastoid air cells are clear. Major intracranial vascular flow voids are preserved.  IMPRESSION:1. No acute intracranial abnormality or mass.2. Minimal nonspecific cerebral white matter changes.  Patient reports headache started last October. Reviewed records in Epic starting at that time. In November 2016 patient was having frequent headaches and neck pain into the upper extremities with tingling and numbness. 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 MRI of the cervical spine showed a bulging disc at C5-C6  and she was referred to neurosurgery. Patient was seen again in May of this year complaining of migraine headaches. Associated symptoms nausea, photophobia. No fever and no chills no vomiting or dizziness. She is status post laminectomy with improvement in back pain and resolution of numbness but headaches persisted. She was started on tizanidine by Dr Yetta Barre, her neurosurgeon but couldn't tolerate it. She was started on Topamax for preventative management and Fioricet for acute management. And she was referred to neurology.    Review of Systems: Patient complains of symptoms per HPI as well as the following symptoms: Blurred vision, easy bruising, shortness of breath, snoring, feeling hot, increased thirst, headache, numbness, weakness, dizziness, snoring, shift 4, restless legs, not enough sleep, decreased energy. Pertinent negatives per HPI. All others negative. Patient was started on Topamax for prevention. Fioricet for by mouth abortive therapy at home. Referral to headache clinic was placed.   Social History   Social History  . Marital Status: Single    Spouse Name: N/A  . Number of Children: N/A  . Years of Education: 12   Occupational History  . Not on file.    Social History Main Topics  . Smoking status: Never Smoker   . Smokeless tobacco: Never Used  . Alcohol Use: 0.0 oz/week    0 Standard drinks or equivalent per week     Comment: rare   . Drug Use: No  . Sexual Activity: No   Other Topics Concern  . Not on file   Social History Narrative   Lives w/ sister   Caffeine use:  Tea and soda- 2-3 per day    Family History  Problem Relation Age of Onset  . Congestive Heart Failure Mother 59    Deceased  . Stroke Mother   . Diabetes Mother   . Stroke Brother     multiple   . Hypertension Brother     x3  . Hypertension Sister     x4  . Migraines Sister   . Diabetes Sister   . Diabetes Brother   . Diabetes Maternal Grandmother   . Breast cancer Maternal Aunt   . Heart attack Maternal Uncle   . Healthy Son     x1  . Healthy Daughter     x2    Past Medical History  Diagnosis Date  . GERD (gastroesophageal reflux disease)   .  HA (headache)   . History of chicken pox   . Migraine     Past Surgical History  Procedure Laterality Date  . No past surgeries    . Cervical spine surgery      Current Outpatient Prescriptions  Medication Sig Dispense Refill  . butalbital-aspirin-caffeine (FIORINAL) 50-325-40 MG capsule Take 1 capsule by mouth 2 (two) times daily as needed for headache. 14 capsule 0  . ondansetron (ZOFRAN ODT) 8 MG disintegrating tablet Take 1 tablet (8 mg total) by mouth every 8 (eight) hours as needed for nausea or vomiting. 10 tablet 0  . SUMAtriptan (IMITREX) 100 MG tablet Take 1 tablet (100 mg total) by mouth once. May repeat in 2 hours if headache persists or recurs. 10 tablet 12  . topiramate (TOPAMAX) 25 MG tablet Increase by one pill nightly until taking 100mg  at night (4 pills) 120 tablet 12   No current facility-administered medications for this visit.    Allergies as of 03/15/2016 - Review Complete 03/15/2016  Allergen Reaction Noted  . Vicodin [hydrocodone-acetaminophen] Nausea And Vomiting  06/15/2015    Vitals: BP 144/98 mmHg  Pulse 76  Resp 20  Ht 5\' 7"  (1.702 m)  Wt 234 lb (106.142 kg)  BMI 36.64 kg/m2  LMP 02/08/2016 Last Weight:  Wt Readings from Last 1 Encounters:  03/15/16 234 lb (106.142 kg)   Last Height:   Ht Readings from Last 1 Encounters:  03/15/16 5\' 7"  (1.702 m)    General: The patient is awake, alert and appears not in acute distress. The patient is well groomed. Head: Normocephalic, atraumatic. Neck is supple. Mallampati 3 , neck circumference: 16 Cardiovascular:  Regular rate and rhythm , without  murmurs or carotid bruit, and without distended neck veins. Respiratory: Lungs are clear to auscultation. Skin:  Without evidence of edema, or rash Trunk: BMI is  elevated and patient  has normal posture.   Neurologic exam : The patient is awake and alert, oriented to place and time.  Memory subjective described as intact.  There is a normal attention span & concentration ability. Speech is fluent without dysarthria, dysphonia or aphasia. Mood and affect are appropriate.  Cranial nerves: Pupils are equal and briskly reactive to light. Funduscopic exam without evidence of pallor or edema. Extraocular movements  in vertical and horizontal planes intact and without nystagmus. Visual fields by finger perimetry are intact. Hearing to finger rub intact.  Facial sensation intact to fine touch. Facial motor strength is symmetric and tongue and uvula move midline.  Motor exam:   Normal tone and normal muscle bulk and symmetric normal strength in all extremities. Sensory:  Fine touch, pinprick and vibration - Proprioception is tested in the upper extremities only. This was  normal. Coordination: Rapid alternating movements in the fingers/hands is tested and normal. Finger-to-nose maneuver tested and normal without evidence of ataxia, dysmetria or tremor.  Gait and station: Patient walks without assistive device and is able and assisted stool climb up to the exam  table. Strength within normal limits. Stance is stable and normal.  The patient reports balance or equilibrium problems, Deep tendon reflexes: in the  upper and lower extremities are symmetric and intact. Babinski maneuver response is  downgoing.    Assessment/Plan:  45 year old female with chronic migraines without status not intractable.   Significant snoring, periodic limb movements of sleep, morning headaches; Sleep evaluation by SPLIT and Co2.  Sleep evaluation will be performed in form of a split-night polysomnography which has to include capnography.  Patient also reports significant nocturia. Nocturia can be related to obstructive sleep apnea. Morning headaches and sleep related headaches can be related to hypoxemia or hypercapnia. Fatigue can be related to OSA. The patient already had a discussion with Dr. Lucia GaskinsAhern about body mass index and weight loss. To cut out caffeine and to use a protein-based lower carbohydrate diet. Insomnia_ Please remember to try to maintain good sleep hygiene, which means: Keep a regular sleep and wake schedule, try not to exercise or have a meal within 2 hours of your bedtime, try to keep your bedroom conducive for sleep, that is, cool and dark, without light distractors such as an illuminated alarm clock, and refrain from watching TV right before sleep or in the middle of the night and do not keep the TV or radio on during the night. Also, try not to use or play on electronic devices at bedtime, such as your cell phone, tablet PC or laptop. If you like to read at bedtime on an electronic device, try to dim the background light as much as possible. Do not eat in the middle of the night.   We will request a sleep study.    We will look for leg twitching and snoring or sleep apnea.   For chronic insomnia, you are best followed by a psychiatrist and/or sleep psychologist.   We will call you with the sleep study results and make a follow up appointment if needed.       I recommend close follow-up with primary care for vascular risk factors. Recommend weight loss and diet. Education on lifestyle changes, migraine triggers, and headache diary.  To prevent or relieve headaches, try the following: Cool Compress. Lie down and place a cool compress on your head.  Avoid headache triggers. If certain foods or odors seem to have triggered your migraines in the past, avoid them. A headache diary might help you identify triggers.  Include physical activity in your daily routine. Try a daily walk or other moderate aerobic exercise.  Manage stress. Find healthy ways to cope with the stressors, such as delegating tasks on your to-do list.  Practice relaxation techniques. Try deep breathing, yoga, massage and visualization.  Eat regularly. Eating regularly scheduled meals and maintaining a healthy diet might help prevent headaches. Also, drink plenty of fluids.  Follow a regular sleep schedule. Sleep deprivation might contribute to headaches Consider biofeedback. With this mind-body technique, you learn to control certain bodily functions - such as muscle tension, heart rate and blood pressure - to prevent headaches or reduce headache pain.      Dior Stepter, MD   [facility] [address] [place], [place] [id number]  Phone [phone] Fax [phone]

## 2016-03-27 ENCOUNTER — Ambulatory Visit (INDEPENDENT_AMBULATORY_CARE_PROVIDER_SITE_OTHER): Payer: BLUE CROSS/BLUE SHIELD | Admitting: Neurology

## 2016-03-27 DIAGNOSIS — G4730 Sleep apnea, unspecified: Secondary | ICD-10-CM | POA: Diagnosis not present

## 2016-03-27 DIAGNOSIS — G4710 Hypersomnia, unspecified: Secondary | ICD-10-CM | POA: Diagnosis not present

## 2016-03-27 DIAGNOSIS — R51 Headache: Secondary | ICD-10-CM

## 2016-03-27 DIAGNOSIS — R351 Nocturia: Secondary | ICD-10-CM

## 2016-03-27 DIAGNOSIS — R0689 Other abnormalities of breathing: Secondary | ICD-10-CM

## 2016-03-27 DIAGNOSIS — G4726 Circadian rhythm sleep disorder, shift work type: Secondary | ICD-10-CM

## 2016-03-27 DIAGNOSIS — R0683 Snoring: Secondary | ICD-10-CM

## 2016-03-27 DIAGNOSIS — R519 Headache, unspecified: Secondary | ICD-10-CM

## 2016-04-09 ENCOUNTER — Telehealth: Payer: Self-pay | Admitting: Physician Assistant

## 2016-04-09 NOTE — Telephone Encounter (Signed)
Work note was faxed in to work on 03/15/16 with RTW date of 04/09/16. Work should have everything. If not, ok to reprint for patient to pick up today.

## 2016-04-09 NOTE — Telephone Encounter (Signed)
°  Relationship to patient: Self Can be reached: [phone]   Reason for call: Patient request a Return to Work note so that she can return to work today @ 3:30. Need to pick the note up before going to work.

## 2016-04-11 ENCOUNTER — Telehealth: Payer: Self-pay

## 2016-04-11 NOTE — Telephone Encounter (Signed)
I called pt to discuss her sleep study results. No answer, left a message asking her to call me back. 

## 2016-04-12 NOTE — Telephone Encounter (Signed)
Patient is returning a call. °

## 2016-04-12 NOTE — Telephone Encounter (Signed)
I spoke to pt regarding her sleep study results. I advised her that her sleep study did not reveal significant sleep apnea or significant periodic limb movements of sleep disruption. No organic sleep disorder was identified. I advised pt to lose weight, diet, and exercise if not contraindicated. I advised pt to avoid caffeine containing beverages and chocolate. I advised pt to consider a dedicated sleep psychology referral if insomnia is of clinical concern. I advised pt that a follow up appt is not needed with Dr. Vickey Hugerohmeier. Pt says that she will continue to follow up with Dr. Lucia GaskinsAhern for her headaches. Pt verbalized understanding of results. Pt had no questions at this time but was encouraged to call back if questions arise.

## 2016-05-14 ENCOUNTER — Encounter (HOSPITAL_BASED_OUTPATIENT_CLINIC_OR_DEPARTMENT_OTHER): Payer: Self-pay | Admitting: Emergency Medicine

## 2016-05-14 ENCOUNTER — Emergency Department (HOSPITAL_BASED_OUTPATIENT_CLINIC_OR_DEPARTMENT_OTHER)
Admission: EM | Admit: 2016-05-14 | Discharge: 2016-05-14 | Disposition: A | Discharge status: Home / Self Care | Payer: BLUE CROSS/BLUE SHIELD | Attending: Emergency Medicine | Admitting: Emergency Medicine

## 2016-05-14 DIAGNOSIS — R51 Headache: Secondary | ICD-10-CM | POA: Diagnosis not present

## 2016-05-14 DIAGNOSIS — R519 Headache, unspecified: Secondary | ICD-10-CM

## 2016-05-14 DIAGNOSIS — G8929 Other chronic pain: Secondary | ICD-10-CM | POA: Diagnosis not present

## 2016-05-14 MED ORDER — DIPHENHYDRAMINE HCL 50 MG/ML IJ SOLN
50.0000 mg | Freq: Once | INTRAMUSCULAR | Status: AC
  Administered 2016-05-14: 50 mg via INTRAVENOUS
  Filled 2016-05-14: qty 1

## 2016-05-14 MED ORDER — SODIUM CHLORIDE 0.9 % IV BOLUS (SEPSIS)
1000.0000 mL | Freq: Once | INTRAVENOUS | Status: AC
  Administered 2016-05-14: 1000 mL via INTRAVENOUS

## 2016-05-14 MED ORDER — METOCLOPRAMIDE HCL 5 MG/ML IJ SOLN
10.0000 mg | Freq: Once | INTRAMUSCULAR | Status: AC
  Administered 2016-05-14: 10 mg via INTRAVENOUS
  Filled 2016-05-14: qty 2

## 2016-05-14 MED ORDER — KETOROLAC TROMETHAMINE 30 MG/ML IJ SOLN
30.0000 mg | Freq: Once | INTRAMUSCULAR | Status: AC
  Administered 2016-05-14: 30 mg via INTRAVENOUS
  Filled 2016-05-14: qty 1

## 2016-05-14 MED ORDER — METOCLOPRAMIDE HCL 10 MG PO TABS: 10.0000 mg | ORAL_TABLET | Freq: Two times a day (BID) | ORAL | 0 refills | Status: DC | PRN

## 2016-05-14 NOTE — ED Triage Notes (Signed)
Patient states that she has had a headache for a very long time. She has been to multiple dr's for this and has surgery and sleep studies, patient states that tonight she is now having the worst HA ever with Nausea and dizziness that she has not ever had with these headaches

## 2016-05-14 NOTE — ED Provider Notes (Signed)
[facility] Provider Note   CSN: [id number] Arrival date & time: 05/14/16  [id number]  First Provider Contact:  None       History   Chief Complaint Chief Complaint  Patient presents with  . Headache    HPI Kim Macdonald is a 45 y.o. female with past medical history of chronic migraines presenting today with acute headache. Patient states this headache began around 7 PM and has not gone away. She normally gets less than 4 hours of sleep per night. She states she frequently has headaches throughout the day. At times he can be multiple headaches per day, other times it can be every other day. She states now has been able to find the cause of her headaches. No medications have worked for her. She has learned to live with her headaches. This headache became severe and she had nausea associated with it. She denies any fevers or recent infections. She has left upper history numbness which she states is chronic and has been going on for years as well. She denies any incontinence or lower extremity symptoms. There are no further complaints.  10 Systems reviewed and are negative for acute change except as noted in the HPI.    HPI  Past Medical History:  Diagnosis Date  . GERD (gastroesophageal reflux disease)   . HA (headache)   . History of chicken pox   . Migraine     Patient Active Problem List   Diagnosis Date Noted  . 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 Sleep related headaches 03/15/2016  . Nocturia more than twice per night 03/15/2016  . Snoring 03/15/2016  . Gasping for breath 03/15/2016  . 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 Migraine with aura and without status migrainosus 03/13/2016  . Cervical neck pain with evidence of disc disease 07/04/2015  . Trigeminal neuralgia of left side of face 05/24/2015  . White matter abnormality on MRI of brain 01/17/2015  . Frequent headaches 12/01/2014  . Paresthesias 12/01/2014    Past Surgical  History:  Procedure Laterality Date  . CERVICAL SPINE SURGERY    . NO PAST SURGERIES      OB History    No data available       Home Medications    Prior to Admission medications   Medication Sig Start Date End Date Taking? Authorizing Provider  butalbital-aspirin-caffeine [facility](FIORINAL) [phone] MG capsule Take 1 capsule by mouth 2 (two) times daily as needed for headache. 02/29/16   Waldon MerlWilliam C Martin, PA-C  ondansetron (ZOFRAN ODT) 8 MG disintegrating tablet Take 1 tablet (8 mg total) by mouth every 8 (eight) hours as needed for nausea or vomiting. 06/15/15   [name], MD  SUMAtriptan (IMITREX) 100 MG tablet Take 1 tablet (100 mg total) by mouth once. May repeat in 2 hours if headache persists or recurs. 03/13/16   Anson FretAntonia B Ahern, MD  topiramate (TOPAMAX) 25 MG tablet Increase by one pill nightly until taking 100mg  at night (4 pills) 03/13/16   Anson FretAntonia B Ahern, MD    Family History Family History  Problem Relation Age of Onset  . Congestive Heart Failure Mother [id number]    Deceased  . Stroke Mother   . Diabetes Mother   . Stroke Brother     multiple   . Hypertension Brother     x3  . Hypertension Sister     x4  . Migraines Sister   . Diabetes Sister   . Diabetes Brother   .  Diabetes Maternal Grandmother   . Breast cancer Maternal Aunt   . Heart attack Maternal Uncle   . Healthy Son     x1  . Healthy Daughter     x2    Social History Social History  Substance Use Topics  . Smoking status: Never Smoker  . Smokeless tobacco: Never Used  . Alcohol use 0.0 oz/week     Comment: rare      Allergies   Vicodin [hydrocodone-acetaminophen]   Review of Systems Review of Systems   Physical Exam Updated Vital Signs BP 156/83 (BP Location: Right Arm)   Pulse 78   Temp 97.9 F (36.6 C) (Oral)   Resp 18   Ht 5\' 7"  (1.702 m)   Wt 239 lb (108.4 kg)   LMP 05/07/2016   SpO2 100%   BMI 37.43 kg/m   Physical Exam  Constitutional: She is oriented to person, place, and  time. She appears well-developed and well-nourished. No distress.  HENT:  Head: Normocephalic and atraumatic.  Nose: Nose normal.  Mouth/Throat: Oropharynx is clear and moist. No oropharyngeal exudate.  Eyes: Conjunctivae and EOM are normal. Pupils are equal, round, and reactive to light. No scleral icterus.  Neck: Normal range of motion. Neck supple. No JVD present. No tracheal deviation present. No thyromegaly present.  Cardiovascular: Normal rate, regular rhythm and normal heart sounds.  Exam reveals no gallop and no friction rub.   No murmur heard. Pulmonary/Chest: Effort normal and breath sounds normal. No respiratory distress. She has no wheezes. She exhibits no tenderness.  Abdominal: Soft. Bowel sounds are normal. She exhibits no distension and no mass. There is no tenderness. There is no rebound and no guarding.  Musculoskeletal: Normal range of motion. She exhibits no edema or tenderness.  Lymphadenopathy:    She has no cervical adenopathy.  Neurological: She is alert and oriented to person, place, and time. No cranial nerve deficit. She exhibits normal muscle tone.  Normal strength and sensation in all extremities. Normal cerebellar testing. Normal gait.  Skin: Skin is warm and dry. No rash noted. No erythema. No pallor.  Nursing note and vitals reviewed.    ED Treatments / Results  Labs (all labs ordered are listed, but only abnormal results are displayed) Labs Reviewed - No data to display  EKG  EKG Interpretation None       Radiology No results found.  Procedures Procedures (including critical care time)  Medications Ordered in ED Medications  ketorolac (TORADOL) 30 MG/ML injection 30 mg (not administered)  metoCLOPramide (REGLAN) injection 10 mg (not administered)  diphenhydrAMINE (BENADRYL) injection 50 mg (not administered)  sodium chloride 0.9 % bolus 1,000 mL (not administered)     Initial Impression / Assessment and Plan / ED Course  I have  reviewed the triage vital signs and the nursing notes.  Pertinent labs & imaging results that were available during my care of the patient were reviewed by me and considered in my medical decision making (see chart for details).  Clinical Course    Patient presents to emergency department for acute on chronic headache. Headache has gradually gotten worse since onset, it appears to be her normal headache. I do not believe patient is having an emergent intracranial abnormality. We will treat with Toradol, Reglan, Benadryl and IV fluids. Patient was further educated on sleep hygiene and how this can cause worsening headaches. Images good understanding. Neurology follow-up recommended.   7:15 AM Patient is resting comfortably in bed and in NAD. Upon  awakening she states her headache has completely resolved.  This is likely due to lack of sleep. She was allowed to sleep in the ED for as long as possible.  Will D with reglan to take at home as needed for headaches.  She was again counseled on good sleep hygiene.  Neurology follow up advised.  She appears well and in NAD.  VS remain within her normal limits and she is safe for DC.    Final Clinical Impressions(s) / ED Diagnoses   Final diagnoses:  None    New Prescriptions New Prescriptions   No medications on file     [name], MD 05/14/16 [phone]

## 2016-06-13 ENCOUNTER — Ambulatory Visit: Payer: BLUE CROSS/BLUE SHIELD | Admitting: Neurology

## 2016-06-14 ENCOUNTER — Encounter: Payer: Self-pay | Admitting: Neurology

## 2016-06-27 ENCOUNTER — Emergency Department (HOSPITAL_BASED_OUTPATIENT_CLINIC_OR_DEPARTMENT_OTHER)
Admission: EM | Admit: 2016-06-27 | Discharge: 2016-06-27 | Disposition: A | Discharge status: Home / Self Care | Payer: BLUE CROSS/BLUE SHIELD | Attending: Emergency Medicine | Admitting: Emergency Medicine

## 2016-06-27 ENCOUNTER — Encounter (HOSPITAL_BASED_OUTPATIENT_CLINIC_OR_DEPARTMENT_OTHER): Payer: Self-pay | Admitting: Emergency Medicine

## 2016-06-27 DIAGNOSIS — R1013 Epigastric pain: Secondary | ICD-10-CM | POA: Insufficient documentation

## 2016-06-27 DIAGNOSIS — R51 Headache: Secondary | ICD-10-CM | POA: Diagnosis not present

## 2016-06-27 DIAGNOSIS — R197 Diarrhea, unspecified: Secondary | ICD-10-CM | POA: Insufficient documentation

## 2016-06-27 DIAGNOSIS — R112 Nausea with vomiting, unspecified: Secondary | ICD-10-CM | POA: Insufficient documentation

## 2016-06-27 DIAGNOSIS — R1030 Lower abdominal pain, unspecified: Secondary | ICD-10-CM | POA: Diagnosis present

## 2016-06-27 DIAGNOSIS — R109 Unspecified abdominal pain: Secondary | ICD-10-CM

## 2016-06-27 LAB — URINALYSIS, ROUTINE W REFLEX MICROSCOPIC
Bilirubin Urine: NEGATIVE
Glucose, UA: NEGATIVE mg/dL
Hgb urine dipstick: NEGATIVE
Ketones, ur: NEGATIVE mg/dL
Nitrite: NEGATIVE
Protein, ur: NEGATIVE mg/dL
Specific Gravity, Urine: 1.031 — ABNORMAL HIGH (ref 1.005–1.030)
pH: 5.5 (ref 5.0–8.0)

## 2016-06-27 LAB — CBC WITH DIFFERENTIAL/PLATELET
Basophils Absolute: 0 10*3/uL (ref 0.0–0.1)
Basophils Relative: 0 %
Eosinophils Absolute: 0.1 10*3/uL (ref 0.0–0.7)
Eosinophils Relative: 1 %
HCT: 35.9 % — ABNORMAL LOW (ref 36.0–46.0)
Hemoglobin: 12 g/dL (ref 12.0–15.0)
Lymphocytes Relative: 16 %
Lymphs Abs: 1.8 10*3/uL (ref 0.7–4.0)
MCH: 31.9 pg (ref 26.0–34.0)
MCHC: 33.4 g/dL (ref 30.0–36.0)
MCV: 95.5 fL (ref 78.0–100.0)
Monocytes Absolute: 1 10*3/uL (ref 0.1–1.0)
Monocytes Relative: 9 %
Neutro Abs: 8.7 10*3/uL — ABNORMAL HIGH (ref 1.7–7.7)
Neutrophils Relative %: 74 %
Platelets: 290 10*3/uL (ref 150–400)
RBC: 3.76 MIL/uL — ABNORMAL LOW (ref 3.87–5.11)
RDW: 13.3 % (ref 11.5–15.5)
WBC: 11.8 10*3/uL — ABNORMAL HIGH (ref 4.0–10.5)

## 2016-06-27 LAB — BASIC METABOLIC PANEL
Anion gap: 10 (ref 5–15)
BUN: 9 mg/dL (ref 6–20)
CO2: 23 mmol/L (ref 22–32)
Calcium: 8.7 mg/dL — ABNORMAL LOW (ref 8.9–10.3)
Chloride: 103 mmol/L (ref 101–111)
Creatinine, Ser: 0.78 mg/dL (ref 0.44–1.00)
GFR calc Af Amer: >60 mL/min (ref >60)
GFR calc non Af Amer: >60 mL/min (ref >60)
Glucose, Bld: 109 mg/dL — ABNORMAL HIGH (ref 65–99)
Potassium: 3.5 mmol/L (ref 3.5–5.1)
Sodium: 136 mmol/L (ref 135–145)

## 2016-06-27 LAB — URINE MICROSCOPIC-ADD ON

## 2016-06-27 MED ORDER — SODIUM CHLORIDE 0.9 % IV BOLUS (SEPSIS)
1000.0000 mL | Freq: Once | INTRAVENOUS | Status: AC
  Administered 2016-06-27: 1000 mL via INTRAVENOUS

## 2016-06-27 MED ORDER — KETOROLAC TROMETHAMINE 15 MG/ML IJ SOLN
15.0000 mg | Freq: Once | INTRAMUSCULAR | Status: AC
  Administered 2016-06-27: 15 mg via INTRAVENOUS
  Filled 2016-06-27: qty 1

## 2016-06-27 MED ORDER — ONDANSETRON HCL 4 MG/2ML IJ SOLN
4.0000 mg | Freq: Once | INTRAMUSCULAR | Status: AC
  Administered 2016-06-27: 4 mg via INTRAVENOUS
  Filled 2016-06-27: qty 2

## 2016-06-27 NOTE — ED Provider Notes (Signed)
[facility] Provider Note   CSN: [id number] Arrival date & time: 06/27/16  1812   By signing my name below, I, Kim Macdonald, attest that this documentation has been prepared under the direction and in the presence of Kim Razor, MD . Electronically Signed: Freida Macdonald, Scribe. 06/27/2016. 7:06 PM.   History   Chief Complaint Chief Complaint  Patient presents with  . Abdominal Pain  . Headache    The history is provided by the patient. No language interpreter was used.     HPI Comments:  Kim Macdonald is a 45 y.o. female who presents to the Emergency Department complaining of constant lower abdominal pain since this AM. She reports associated nausea, vomiting, diarrhea and HA. She denies blood in her stool and vomit. She also denies fever, SOB, and urinary symptoms. She reports recent sick contacts; her grandchild who has similar symptoms and is in public school. No alleviating factors noted.   Past Medical History:  Diagnosis Date  . GERD (gastroesophageal reflux disease)   . HA (headache)   . History of chicken pox   . Migraine     Patient Active Problem List   Diagnosis Date Noted  . 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 Sleep related headaches 03/15/2016  . Nocturia more than twice per night 03/15/2016  . Snoring 03/15/2016  . Gasping for breath 03/15/2016  . 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 Migraine with aura and without status migrainosus 03/13/2016  . Cervical neck pain with evidence of disc disease 07/04/2015  . Trigeminal neuralgia of left side of face 05/24/2015  . White matter abnormality on MRI of brain 01/17/2015  . Frequent headaches 12/01/2014  . Paresthesias 12/01/2014    Past Surgical History:  Procedure Laterality Date  . CERVICAL SPINE SURGERY    . NO PAST SURGERIES      OB History    No data available       Home Medications    Prior to Admission medications   Medication Sig Start Date  End Date Taking? Authorizing Provider  butalbital-aspirin-caffeine [facility]) [phone] MG capsule Take 1 capsule by mouth 2 (two) times daily as needed for headache. 02/29/16   [name], PA-C  metoCLOPramide (REGLAN) 10 MG tablet Take 1 tablet (10 mg total) by mouth 2 (two) times daily as needed for nausea (nausea/headache). 05/14/16   [name], MD  ondansetron (ZOFRAN ODT) 8 MG disintegrating tablet Take 1 tablet (8 mg total) by mouth every 8 (eight) hours as needed for nausea or vomiting. 06/15/15   [name], MD  SUMAtriptan (IMITREX) 100 MG tablet Take 1 tablet (100 mg total) by mouth once. May repeat in 2 hours if headache persists or recurs. 03/13/16   [name], MD  topiramate (TOPAMAX) 25 MG tablet Increase by one pill nightly until taking 100mg  at night (4 pills) 03/13/16   [name], MD    Family History Family History  Problem Relation Age of Onset  . Congestive Heart Failure Mother 61    Deceased  . Stroke Mother   . Diabetes Mother   . Stroke Brother     multiple   . Hypertension Brother     x3  . Hypertension Sister     x4  . Migraines Sister   . Diabetes Sister   . Diabetes Brother   . Diabetes Maternal Grandmother   . Breast cancer Maternal Aunt   . Heart attack Maternal Uncle   .  Healthy Son     x1  . Healthy Daughter     x2    Social History Social History  Substance Use Topics  . Smoking status: Never Smoker  . Smokeless tobacco: Never Used  . Alcohol use 0.0 oz/week     Comment: rare      Allergies   Vicodin [hydrocodone-acetaminophen]   Review of Systems Review of Systems  Constitutional: Negative for chills and fever.  Respiratory: Negative for shortness of breath.   Cardiovascular: Negative for chest pain.  Gastrointestinal: Positive for abdominal pain, diarrhea, nausea and vomiting. Negative for blood in stool.  Genitourinary: Negative for dysuria, frequency, hematuria and urgency.  Neurological: Positive for  headaches.     Physical Exam Updated Vital Signs BP (!) 143/111 (BP Location: Left Arm)   Pulse 95   Temp 98.1 F (36.7 C) (Oral)   Resp 18   Ht 5\' 7"  (1.702 m)   Wt 236 lb (107 kg)   LMP 06/07/2016 (Exact Date)   SpO2 99%   BMI 36.96 kg/m   Physical Exam  Constitutional: She is oriented to person, place, and time. She appears well-developed and well-nourished. No distress.  HENT:  Head: Normocephalic and atraumatic.  Eyes: EOM are normal.  Neck: Normal range of motion.  Cardiovascular: Normal rate, regular rhythm and normal heart sounds.   Pulmonary/Chest: Effort normal and breath sounds normal.  Abdominal: Soft. She exhibits no distension. There is tenderness (mild ) in the epigastric area.  Musculoskeletal: Normal range of motion.  Neurological: She is alert and oriented to person, place, and time.  Skin: Skin is warm and dry.  Psychiatric: She has a normal mood and affect. Judgment normal.  Nursing note and vitals reviewed.    ED Treatments / Results  DIAGNOSTIC STUDIES:  Oxygen Saturation is 99% on RA, normal by my interpretation.    COORDINATION OF CARE:  6:58 PM Discussed treatment plan with pt at bedside and pt agreed to plan.  Labs (all labs ordered are listed, but only abnormal results are displayed) Labs Reviewed  CBC WITH DIFFERENTIAL/PLATELET - Abnormal; Notable for the following:       Result Value   WBC 11.8 (*)    RBC 3.76 (*)    HCT 35.9 (*)    Neutro Abs 8.7 (*)    All other components within normal limits  BASIC METABOLIC PANEL - Abnormal; Notable for the following:    Glucose, Bld 109 (*)    Calcium 8.7 (*)    All other components within normal limits  URINALYSIS, ROUTINE W REFLEX MICROSCOPIC (NOT AT [facility]) - Abnormal; Notable for the following:    Color, Urine AMBER (*)    APPearance CLOUDY (*)    Specific Gravity, Urine 1.031 (*)    Leukocytes, UA TRACE (*)    All other components within normal limits  URINE MICROSCOPIC-ADD ON -  Abnormal; Notable for the following:    Squamous Epithelial / LPF 0-5 (*)    Bacteria, UA FEW (*)    All other components within normal limits    EKG  EKG Interpretation None       Radiology No results found.   .ris  Procedures Procedures (including critical care time)  Medications Ordered in ED Medications - No data to display   Initial Impression / Assessment and Plan / ED Course  I have reviewed the triage vital signs and the nursing notes.  Pertinent labs & imaging results that were available during my care of  the patient were reviewed by me and considered in my medical decision making (see chart for details).  Clinical Course    45 year old female with abdominal pain, nausea, vomiting and diarrhea. Fairly minimal epigastric tenderness on exam. Generally well-appearing. I suspect she has a viral gastroenteritis.. Low suspicion for acute surgical intra-abdominal pathology or other emergent process. Plan symptomatic treatment. Return precautions were discussed.  Final Clinical Impressions(s) / ED Diagnoses   Final diagnoses:  Abdominal pain, unspecified abdominal location    New Prescriptions New Prescriptions   No medications on file   I personally preformed the services scribed in my presence. The recorded information has been reviewed is accurate. Kim RazorStephen Latifah Padin, MD.     Kim RazorStephen Pilar Corrales, MD 07/03/16 [id number]

## 2016-06-27 NOTE — ED Triage Notes (Signed)
Pt reports vomit x3 with diarrhea and RLQ pain since this morning.  Pt also reports headache.

## 2016-07-19 ENCOUNTER — Encounter (HOSPITAL_BASED_OUTPATIENT_CLINIC_OR_DEPARTMENT_OTHER): Payer: Self-pay | Admitting: *Deleted

## 2016-07-19 ENCOUNTER — Emergency Department (HOSPITAL_BASED_OUTPATIENT_CLINIC_OR_DEPARTMENT_OTHER)
Admission: EM | Admit: 2016-07-19 | Discharge: 2016-07-19 | Disposition: A | Discharge status: Home / Self Care | Payer: BLUE CROSS/BLUE SHIELD | Attending: Emergency Medicine | Admitting: Emergency Medicine

## 2016-07-19 DIAGNOSIS — R5383 Other fatigue: Secondary | ICD-10-CM | POA: Diagnosis not present

## 2016-07-19 DIAGNOSIS — Z7982 Long term (current) use of aspirin: Secondary | ICD-10-CM | POA: Diagnosis not present

## 2016-07-19 DIAGNOSIS — Z79899 Other long term (current) drug therapy: Secondary | ICD-10-CM | POA: Insufficient documentation

## 2016-07-19 DIAGNOSIS — G4700 Insomnia, unspecified: Secondary | ICD-10-CM | POA: Insufficient documentation

## 2016-07-19 LAB — BASIC METABOLIC PANEL
ANION GAP: 6 (ref 5–15)
BUN: 11 mg/dL (ref 6–20)
CALCIUM: 8.7 mg/dL — AB (ref 8.9–10.3)
CO2: 24 mmol/L (ref 22–32)
Chloride: 105 mmol/L (ref 101–111)
Creatinine, Ser: 0.94 mg/dL (ref 0.44–1.00)
Glucose, Bld: 92 mg/dL (ref 65–99)
Potassium: 3.5 mmol/L (ref 3.5–5.1)
Sodium: 135 mmol/L (ref 135–145)

## 2016-07-19 LAB — CBC
HCT: 36.9 % (ref 36.0–46.0)
HEMOGLOBIN: 12.3 g/dL (ref 12.0–15.0)
MCH: 31.9 pg (ref 26.0–34.0)
MCHC: 33.3 g/dL (ref 30.0–36.0)
MCV: 95.6 fL (ref 78.0–100.0)
Platelets: 337 10*3/uL (ref 150–400)
RBC: 3.86 MIL/uL — AB (ref 3.87–5.11)
RDW: 12.6 % (ref 11.5–15.5)
WBC: 10.6 10*3/uL — ABNORMAL HIGH (ref 4.0–10.5)

## 2016-07-19 NOTE — ED Notes (Signed)
Pt reports feeling extreme fatigue x 2 days. Pt states she has not been sleeping well. Pt states the nurse at worked checked her BP and it was 170/146 and she was told to come here for further evaluation. Pt reports slight headache and had SOB earlier, none at this time.

## 2016-07-19 NOTE — ED Notes (Addendum)
Pt attempted to get urine sample without success.

## 2016-07-19 NOTE — ED Notes (Signed)
Asked pt to collect urin sample pt stated she tried and dropped the cup in the toilet

## 2016-07-19 NOTE — ED Triage Notes (Signed)
Felt tired at work. Her BP was elevated. States she has not been able to sleep. She is not taking BP medication. No pain. Pin like pain in the left side of her neck and left hand since getting to work at [employer]3pm. She is ambulatory to triage. Alert. Since having surgery on her neck last year she states she gets the numbness in her arm. Pin like sensation has been constant for a year.

## 2016-07-19 NOTE — ED Provider Notes (Signed)
[facility] Provider Note   CSN: [id number] Arrival date & time: 07/19/16  1827 By signing my name below, I, Levon Hedger, attest that this documentation has been prepared under the direction and in the presence of No att. providers found . Electronically Signed: Levon Hedger, Scribe. 07/19/2016. 8:12 PM.   History   Chief Complaint Chief Complaint  Patient presents with  . Fatigue   HPI Kim Macdonald is a 45 y.o. female with hx of circadian rhythm sleep disorder and hypersomia with sleep apnea who presents to the Emergency Department complaining of acute on chronic fatigue onset two days ago. She states that she gets off work at 1 am and can't fall asleep until 6 or 7 am; pt sleeps between 2-4 hours a night. She also notes intermittent left sided numbness x1 year s/p surgery. Pt also complains of elevated blood pressure. Per pt, she had her BP check at work where it was 170/146 and was told to come here for further evaluation. She denies any caffeine, tobacco, or alcohol use. Pt denies any chest pain or blurred vision.   The history is provided by the patient. No language interpreter was used.   Past Medical History:  Diagnosis Date  . GERD (gastroesophageal reflux disease)   . HA (headache)   . History of chicken pox   . Migraine    Patient Active Problem List   Diagnosis Date Noted  . 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 Sleep related headaches 03/15/2016  . Nocturia more than twice per night 03/15/2016  . Snoring 03/15/2016  . Gasping for breath 03/15/2016  . 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 Migraine with aura and without status migrainosus 03/13/2016  . Cervical neck pain with evidence of disc disease 07/04/2015  . Trigeminal neuralgia of left side of face 05/24/2015  . White matter abnormality on MRI of brain 01/17/2015  . Frequent headaches 12/01/2014  . Paresthesias 12/01/2014   Past Surgical History:  Procedure  Laterality Date  . CERVICAL SPINE SURGERY    . NO PAST SURGERIES     OB History    No data available     Home Medications    Prior to Admission medications   Medication Sig Start Date End Date Taking? Authorizing Provider  butalbital-aspirin-caffeine [facility]) 50-325-40 MG capsule Take 1 capsule by mouth 2 (two) times daily as needed for headache. 07/20/16   [name], PA-C  metoCLOPramide (REGLAN) 10 MG tablet Take 1 tablet (10 mg total) by mouth 2 (two) times daily as needed for nausea (nausea/headache). 05/14/16   [name], MD  metoprolol tartrate (LOPRESSOR) 25 MG tablet Take 1 tablet (25 mg total) by mouth 2 (two) times daily. 07/20/16   [name], PA-C  ondansetron (ZOFRAN ODT) 8 MG disintegrating tablet Take 1 tablet (8 mg total) by mouth every 8 (eight) hours as needed for nausea or vomiting. 06/15/15   [name], MD  SUMAtriptan (IMITREX) 100 MG tablet Take 1 tablet (100 mg total) by mouth once. May repeat in 2 hours if headache persists or recurs. 03/13/16   [name], MD  traZODone (DESYREL) 50 MG tablet Take 0.5 tablets (25 mg total) by mouth at bedtime as needed for sleep. 07/20/16   [name], PA-C   Family History Family History  Problem Relation Age of Onset  . Congestive Heart Failure Mother 47    Deceased  . Stroke Mother   . Diabetes Mother   .  Stroke Brother     multiple   . Hypertension Brother     x3  . Hypertension Sister     x4  . Migraines Sister   . Diabetes Sister   . Diabetes Brother   . Diabetes Maternal Grandmother   . Breast cancer Maternal Aunt   . Heart attack Maternal Uncle   . Healthy Son     x1  . Healthy Daughter     x2   Social History Social History  Substance Use Topics  . Smoking status: Never Smoker  . Smokeless tobacco: Never Used  . Alcohol use 0.0 oz/week     Comment: rare    Allergies   Vicodin [hydrocodone-acetaminophen]  Review of Systems Review of Systems  Constitutional: Positive  for fatigue.  Eyes: Negative for visual disturbance.  Cardiovascular: Negative for chest pain.  Psychiatric/Behavioral: Positive for sleep disturbance.  All other systems reviewed and are negative.  Physical Exam Updated Vital Signs BP 154/90 (BP Location: Right Arm)   Pulse 70   Temp 97.9 F (36.6 C) (Oral)   Resp 18   Ht 5\' 7"  (1.702 m)   Wt 236 lb (107 kg)   LMP 07/12/2016   SpO2 100%   BMI 36.96 kg/m   Physical Exam  Constitutional: She is oriented to person, place, and time. She appears well-developed and well-nourished. No distress.  HENT:  Head: Normocephalic and atraumatic.  Eyes: Conjunctivae are normal.  Cardiovascular: Normal rate.   Pulmonary/Chest: Effort normal.  Abdominal: She exhibits no distension.  Musculoskeletal:  Normal strength/sensation BUE   Neurological: She is alert and oriented to person, place, and time.  Cranial nerves are normal; normal finger to nose  Skin: Skin is warm and dry.  Psychiatric: She has a normal mood and affect.  Nursing note and vitals reviewed.  ED Treatments / Results  DIAGNOSTIC STUDIES:  Oxygen Saturation is 98% on RA, normal by my interpretation.    COORDINATION OF CARE:  8:11 PM Discussed treatment plan with pt at bedside and pt agreed to plan.   Labs (all labs ordered are listed, but only abnormal results are displayed) Labs Reviewed  BASIC METABOLIC PANEL - Abnormal; Notable for the following:       Result Value   Calcium 8.7 (*)    All other components within normal limits  CBC - Abnormal; Notable for the following:    WBC 10.6 (*)    RBC 3.86 (*)    All other components within normal limits    EKG  EKG Interpretation  Date/Time:  Thursday July 19 2016 19:40:29 EDT Ventricular Rate:  65 PR Interval:    QRS Duration: 83 QT Interval:  397 QTC Calculation: 413 R Axis:   45 Text Interpretation:  Sinus rhythm Low voltage, precordial leads Borderline T abnormalities, anterior leads Baseline wander  in lead(s) V3 No previous ECGs available Confirmed by [facility] MD, ERIN ([id number]) on 07/20/2016 5:22:21 PM      Radiology No results found.  Procedures Procedures (including critical care time)  Medications Ordered in ED Medications - No data to display   Initial Impression / Assessment and Plan / ED Course  I have reviewed the triage vital signs and the nursing notes.  Pertinent labs & imaging results that were available during my care of the patient were reviewed by me and considered in my medical decision making (see chart for details).  Clinical Course   Suspect her main motivation is that she wants a work note,  however states she doesn't sleep well at night and thus doesn't stay awake well during the day. Chronic problem, seen multiple specialists. Also with an elevated BP today at work. Workup negative. Exam negative. Screening for psych/domestic violence negative. No obvious easy causes for her sleeping difficulties. Stable for discharge and continued PCP workup.   Final Clinical Impressions(s) / ED Diagnoses   Final diagnoses:  Fatigue, unspecified type   New Prescriptions Discharge Medication List as of 07/19/2016 11:08 PM    I personally performed the services described in this documentation, which was scribed in my presence. The recorded information has been reviewed and is accurate.     Marily MemosJason Carlester Kasparek, MD 07/21/16 [phone]

## 2016-07-20 ENCOUNTER — Encounter: Payer: Self-pay | Admitting: Physician Assistant

## 2016-07-20 ENCOUNTER — Encounter: Payer: Self-pay | Admitting: *Deleted

## 2016-07-20 ENCOUNTER — Ambulatory Visit (INDEPENDENT_AMBULATORY_CARE_PROVIDER_SITE_OTHER): Payer: BLUE CROSS/BLUE SHIELD | Admitting: Physician Assistant

## 2016-07-20 VITALS — BP 146/80 | HR 72 | Temp 98.1°F | Resp 16 | Ht 67.0 in | Wt 233.4 lb

## 2016-07-20 DIAGNOSIS — G43709 Chronic migraine without aura, not intractable, without status migrainosus: Secondary | ICD-10-CM

## 2016-07-20 DIAGNOSIS — N6452 Nipple discharge: Secondary | ICD-10-CM

## 2016-07-20 DIAGNOSIS — G4700 Insomnia, unspecified: Secondary | ICD-10-CM

## 2016-07-20 DIAGNOSIS — IMO0002 Reserved for concepts with insufficient information to code with codable children: Secondary | ICD-10-CM

## 2016-07-20 DIAGNOSIS — I10 Essential (primary) hypertension: Secondary | ICD-10-CM | POA: Diagnosis not present

## 2016-07-20 DIAGNOSIS — N644 Mastodynia: Secondary | ICD-10-CM

## 2016-07-20 LAB — TSH: TSH: 0.67 mIU/L

## 2016-07-20 LAB — POCT URINE PREGNANCY: PREG TEST UR: NEGATIVE

## 2016-07-20 MED ORDER — BUTALBITAL-ASA-CAFFEINE 50-325-40 MG PO CAPS: 1.0000 | ORAL_CAPSULE | Freq: Two times a day (BID) | ORAL | 0 refills | Status: DC | PRN

## 2016-07-20 MED ORDER — TRAZODONE HCL 50 MG PO TABS: 25.0000 mg | ORAL_TABLET | Freq: Every evening | ORAL | 3 refills | Status: DC | PRN

## 2016-07-20 MED ORDER — METOPROLOL TARTRATE 25 MG PO TABS: 25.0000 mg | ORAL_TABLET | Freq: Two times a day (BID) | ORAL | 3 refills | Status: DC

## 2016-07-20 NOTE — Progress Notes (Signed)
Patient presents to clinic today for ER follow-up of headaches and elevated BP readings. Patient with history of chronic migraine, followed by Neurology. Was experiencing 3 days of intermittent mild-severe migraine headaches. Co-worker checked blood pressure and was noted to be in 170s/100s. Patient was taken to ER. ER workup included unremarkable labs and EKG revealing NSR. Patients' BP improved in ER. Patient was stable and sent home.. Since discharge patient has noted intermittent headaches, same as chronic headache for which she is followed by Neurology. Notes some mild fatigue. Denies chest pain, palpitations, LH or dizziness. Does note some intermittent stabbing pain in L breast. Denies trauma or injury to breast. Denies change to skin, areola or nipple. Denies mass. Does note ability to express clear discharge from the left nipple. Denies symptoms of R breast.    Past Medical History:  Diagnosis Date  . GERD (gastroesophageal reflux disease)   . HA (headache)   . History of chicken pox   . Migraine     Current Outpatient Prescriptions on File Prior to Visit  Medication Sig Dispense Refill  . metoCLOPramide (REGLAN) 10 MG tablet Take 1 tablet (10 mg total) by mouth 2 (two) times daily as needed for nausea (nausea/headache). 12 tablet 0  . ondansetron (ZOFRAN ODT) 8 MG disintegrating tablet Take 1 tablet (8 mg total) by mouth every 8 (eight) hours as needed for nausea or vomiting. 10 tablet 0  . SUMAtriptan (IMITREX) 100 MG tablet Take 1 tablet (100 mg total) by mouth once. May repeat in 2 hours if headache persists or recurs. 10 tablet 12   No current facility-administered medications on file prior to visit.     Allergies  Allergen Reactions  . Vicodin [Hydrocodone-Acetaminophen] Nausea And Vomiting    Family History  Problem Relation Age of Onset  . Congestive Heart Failure Mother 19    Deceased  . Stroke Mother   . Diabetes Mother   . Stroke Brother     multiple   .  Hypertension Brother     x3  . Hypertension Sister     x4  . Migraines Sister   . Diabetes Sister   . Diabetes Brother   . Diabetes Maternal Grandmother   . Breast cancer Maternal Aunt   . Heart attack Maternal Uncle   . Healthy Son     x1  . Healthy Daughter     x2    Social History   Social History  . Marital status: Single    Spouse name: N/A  . Number of children: N/A  . Years of education: 17   Social History Main Topics  . Smoking status: Never Smoker  . Smokeless tobacco: Never Used  . Alcohol use 0.0 oz/week     Comment: rare   . Drug use: No  . Sexual activity: No   Other Topics Concern  . None   Social History Narrative   Lives w/ sister   Caffeine use:  Tea and soda- 2-3 per day    Review of Systems - See HPI.  All other ROS are negative.  BP (!) 146/80 (BP Location: Right Arm, Patient Position: Sitting, Cuff Size: Large)   Pulse 72   Temp 98.1 F (36.7 C) (Oral)   Resp 16   Ht 5' 7" (1.702 m)   Wt 233 lb 6 oz (105.9 kg)   LMP 07/12/2016   SpO2 98%   BMI 36.55 kg/m   Physical Exam  Constitutional: She is oriented to person, place,  and time and well-developed, well-nourished, and in no distress.  HENT:  Head: Normocephalic and atraumatic.  Eyes: Conjunctivae are normal.  Neck: Neck supple.  Cardiovascular: Normal rate, regular rhythm, normal heart sounds and intact distal pulses.   Pulmonary/Chest: Effort normal and breath sounds normal. No respiratory distress. She has no wheezes. She has no rales. She exhibits no tenderness. Right breast exhibits no inverted nipple, no mass, no nipple discharge, no skin change and no tenderness. Left breast exhibits nipple discharge. Left breast exhibits no inverted nipple, no mass, no skin change and no tenderness.  Neurological: She is alert and oriented to person, place, and time. No cranial nerve deficit.  Skin: Skin is warm and dry. No rash noted.  Psychiatric: Affect normal.  Vitals  reviewed.   Recent Results (from the past 2160 hour(s))  Urinalysis, Routine w reflex microscopic (not at [facility])     Status: Abnormal   Collection Time: 06/27/16  7:35 PM  Result Value Ref Range   Color, Urine AMBER (A) YELLOW    Comment: BIOCHEMICALS MAY BE AFFECTED BY COLOR   APPearance CLOUDY (A) CLEAR   Specific Gravity, Urine 1.031 (H) 1.005 - 1.030   pH 5.5 5.0 - 8.0   Glucose, UA NEGATIVE NEGATIVE mg/dL   Hgb urine dipstick NEGATIVE NEGATIVE   Bilirubin Urine NEGATIVE NEGATIVE   Ketones, ur NEGATIVE NEGATIVE mg/dL   Protein, ur NEGATIVE NEGATIVE mg/dL   Nitrite NEGATIVE NEGATIVE   Leukocytes, UA TRACE (A) NEGATIVE  Urine microscopic-add on     Status: Abnormal   Collection Time: 06/27/16  7:35 PM  Result Value Ref Range   Squamous Epithelial / LPF 0-5 (A) NONE SEEN   WBC, UA 0-5 0 - 5 WBC/hpf   RBC / HPF 0-5 0 - 5 RBC/hpf   Bacteria, UA FEW (A) NONE SEEN   Urine-Other MUCOUS PRESENT     Comment: AMORPHOUS URATES/PHOSPHATES  CBC with Differential     Status: Abnormal   Collection Time: 06/27/16  7:43 PM  Result Value Ref Range   WBC 11.8 (H) 4.0 - 10.5 K/uL   RBC 3.76 (L) 3.87 - 5.11 MIL/uL   Hemoglobin 12.0 12.0 - 15.0 g/dL   HCT 35.9 (L) 36.0 - 46.0 %   MCV 95.5 78.0 - 100.0 fL   MCH 31.9 26.0 - 34.0 pg   MCHC 33.4 30.0 - 36.0 g/dL   RDW 13.3 11.5 - 15.5 %   Platelets 290 150 - 400 K/uL   Neutrophils Relative % 74 %   Neutro Abs 8.7 (H) 1.7 - 7.7 K/uL   Lymphocytes Relative 16 %   Lymphs Abs 1.8 0.7 - 4.0 K/uL   Monocytes Relative 9 %   Monocytes Absolute 1.0 0.1 - 1.0 K/uL   Eosinophils Relative 1 %   Eosinophils Absolute 0.1 0.0 - 0.7 K/uL   Basophils Relative 0 %   Basophils Absolute 0.0 0.0 - 0.1 K/uL  Basic metabolic panel     Status: Abnormal   Collection Time: 06/27/16  7:43 PM  Result Value Ref Range   Sodium 136 135 - 145 mmol/L   Potassium 3.5 3.5 - 5.1 mmol/L   Chloride 103 101 - 111 mmol/L   CO2 23 22 - 32 mmol/L   Glucose, Bld 109 (H) 65 -  99 mg/dL   BUN 9 6 - 20 mg/dL   Creatinine, Ser 0.78 0.44 - 1.00 mg/dL   Calcium 8.7 (L) 8.9 - 10.3 mg/dL   GFR calc non Af  Amer >60 >60 mL/min   GFR calc Af Amer >60 >60 mL/min    Comment: (NOTE) The eGFR has been calculated using the CKD EPI equation. This calculation has not been validated in all clinical situations. eGFR's persistently <60 mL/min signify possible Chronic Kidney Disease.    Anion gap 10 5 - 15  Basic metabolic panel     Status: Abnormal   Collection Time: 07/19/16  7:40 PM  Result Value Ref Range   Sodium 135 135 - 145 mmol/L   Potassium 3.5 3.5 - 5.1 mmol/L   Chloride 105 101 - 111 mmol/L   CO2 24 22 - 32 mmol/L   Glucose, Bld 92 65 - 99 mg/dL   BUN 11 6 - 20 mg/dL   Creatinine, Ser 0.94 0.44 - 1.00 mg/dL   Calcium 8.7 (L) 8.9 - 10.3 mg/dL   GFR calc non Af Amer >60 >60 mL/min   GFR calc Af Amer >60 >60 mL/min    Comment: (NOTE) The eGFR has been calculated using the CKD EPI equation. This calculation has not been validated in all clinical situations. eGFR's persistently <60 mL/min signify possible Chronic Kidney Disease.    Anion gap 6 5 - 15  CBC     Status: Abnormal   Collection Time: 07/19/16  7:40 PM  Result Value Ref Range   WBC 10.6 (H) 4.0 - 10.5 K/uL   RBC 3.86 (L) 3.87 - 5.11 MIL/uL   Hemoglobin 12.3 12.0 - 15.0 g/dL   HCT 36.9 36.0 - 46.0 %   MCV 95.6 78.0 - 100.0 fL   MCH 31.9 26.0 - 34.0 pg   MCHC 33.3 30.0 - 36.0 g/dL   RDW 12.6 11.5 - 15.5 %   Platelets 337 150 - 400 K/uL  POCT urine pregnancy     Status: Normal   Collection Time: 07/20/16  4:32 PM  Result Value Ref Range   Preg Test, Ur Negative Negative  Prolactin     Status: None   Collection Time: 07/20/16  4:41 PM  Result Value Ref Range   Prolactin 7.3 ng/mL    Comment: Reference Range Adult Female  Non-Pregnant 3.0-30.0 ng/mL  Pregnant 10.0-209.0 ng/mL  Postmenopausal 2.0-20.0 ng/mL     TSH     Status: None   Collection Time: 07/20/16  4:53 PM  Result Value Ref  Range   TSH 0.67 mIU/L    Comment:   Reference Range   > or = 20 Years  0.40-4.50   Pregnancy Range First trimester  0.26-2.66 Second trimester 0.55-2.73 Third trimester  0.43-2.91     Cytology - non gyn     Status: None   Collection Time: 07/24/16  4:50 PM  Result Value Ref Range   FINAL DIAGNOSIS: SEE NOTE     Comment: - NO MALIGNANT CELLS IDENTIFIED. There are foam cells and/or benign duct cells in a lipoproteinaceous background.  These features are suggestive of galactorrhea or duct ectasia.  Recommend clinical correlation. - EVALUATION LIMITED DUE TO HYPOCELLULARITY.    Cytotechnologist: SEE NOTE     Comment: JLT, BS CT(ASCP)   Pathologist: SEE NOTE     Comment: Reviewed by Tawana Scale. Fields, MD, FCAP Paramedic on File)    Assessment/Plan: 1. Nipple discharge Urine pregnancy negative. Will check Prolactin level. Recent TSH within normal limits. Clear discharged expressed from left nipple. Will send for cytology. Will order diagnostic mammogram and [place] of breasts. - Prolactin - POCT urine pregnancy - Cytology - non gyn -  MM Digital Diagnostic Unilat L; Future - US BREAST COMPLETE UNI LEFT INC AXILLA; Future  2. Essential hypertension Will begin Lopressore 25 mg BID. DASH diet discussed. Will repeat TSH today. FU scheduled. - metoprolol tartrate (LOPRESSOR) 25 MG tablet; Take 1 tablet (25 mg total) by mouth 2 (two) times daily.  Dispense: 180 tablet; Refill: 3 - TSH  3. Insomnia, unspecified type Trazodone PRN for sleep FU scheduled. - traZODone (DESYREL) 50 MG tablet; Take 0.5 tablets (25 mg total) by mouth at bedtime as needed for sleep.  Dispense: 30 tablet; Refill: 3 - TSH  4. Chronic migraine Abortive medication refilled. Metoprolol started for HTN but should offer added benefit for headaches. FU with Neurology. - butalbital-aspirin-caffeine (FIORINAL) 50-325-40 MG capsule; Take 1 capsule by mouth 2 (two) times daily as needed for headache.   Dispense: 14 capsule; Refill: 0  5. Breast pain, left No palpable mass or tenderness. Imaging ordered for further assessment. - MM Digital Diagnostic Unilat L; Future - US BREAST COMPLETE UNI LEFT INC AXILLA; Future   Leeanne Rio, PA-C

## 2016-07-20 NOTE — Patient Instructions (Addendum)
Please go to the lab for blood work. I will call with your results.  I want you to start the Trazodone (1/2 pill nightly) to help with sleep. The lack of sleep is affecting mood, pain level, blood pressure and likely triggering headaches.   Also start the Metoprolol as directed for elevated BP. Watch salt intake, especially from fast food.  Please continue your Fiorinal for acute headaches.  The Metoprolol will hopefully help as well.  Follow-up in 1 week.

## 2016-07-21 LAB — PROLACTIN: PROLACTIN: 7.3 ng/mL

## 2016-07-24 DIAGNOSIS — N6452 Nipple discharge: Secondary | ICD-10-CM | POA: Diagnosis not present

## 2016-07-24 NOTE — Progress Notes (Signed)
Pt has seen results on MyChart and message also sent for patient to call back if any questions.

## 2016-07-25 LAB — CYTOLOGY - NON PAP

## 2016-07-26 ENCOUNTER — Other Ambulatory Visit: Payer: Self-pay | Admitting: Physician Assistant

## 2016-07-27 ENCOUNTER — Telehealth: Payer: Self-pay | Admitting: Physician Assistant

## 2016-07-27 DIAGNOSIS — N644 Mastodynia: Secondary | ICD-10-CM

## 2016-07-27 DIAGNOSIS — N6452 Nipple discharge: Secondary | ICD-10-CM

## 2016-07-27 NOTE — Telephone Encounter (Signed)
Caller name: Amber with [facility] Can be reached: [phone]   Reason for call: Please change Mammogram order to [id number] & US order to limited (they cannot do complete) [id number].

## 2016-07-27 NOTE — Telephone Encounter (Signed)
Orders have been replaced. Please see that patient gets scheduled.

## 2016-10-08 ENCOUNTER — Telehealth: Payer: Self-pay

## 2016-10-08 NOTE — Telephone Encounter (Signed)
Spoke with patient regarding symptoms. Patient reports her blood pressure continues to remain high, taking medication as prescribed. Denies visual disturbances, SOB or chest pain. Patient states she is tired all the time and has constant headaches, also reporting intermittent vaginal spotting x 2 weeks. Patient is able to continue to go work and declines earlier appointment due to work schedule. Advised patient if symptoms worsen or she needs to be seen sooner to call the clinic for a sooner appointment. Also advised if she starts experiencing visual disturbances, chest pain or shortness of breath, to go to Emergency Department. Patient verbalized understanding.

## 2016-10-11 ENCOUNTER — Ambulatory Visit: Payer: BLUE CROSS/BLUE SHIELD | Admitting: Physician Assistant

## 2016-10-12 ENCOUNTER — Encounter: Payer: Self-pay | Admitting: Physician Assistant

## 2016-10-12 ENCOUNTER — Ambulatory Visit (INDEPENDENT_AMBULATORY_CARE_PROVIDER_SITE_OTHER): Payer: BLUE CROSS/BLUE SHIELD | Admitting: Physician Assistant

## 2016-10-12 VITALS — BP 100/68 | HR 80 | Temp 97.8°F | Resp 16 | Ht 67.0 in | Wt 233.0 lb

## 2016-10-12 DIAGNOSIS — R0600 Dyspnea, unspecified: Secondary | ICD-10-CM

## 2016-10-12 DIAGNOSIS — R5383 Other fatigue: Secondary | ICD-10-CM

## 2016-10-12 DIAGNOSIS — N939 Abnormal uterine and vaginal bleeding, unspecified: Secondary | ICD-10-CM

## 2016-10-12 DIAGNOSIS — N920 Excessive and frequent menstruation with regular cycle: Secondary | ICD-10-CM | POA: Diagnosis not present

## 2016-10-12 LAB — COMPREHENSIVE METABOLIC PANEL
ALT: 17 U/L (ref 0–35)
AST: 19 U/L (ref 0–37)
Albumin: 4.2 g/dL (ref 3.5–5.2)
Alkaline Phosphatase: 83 U/L (ref 39–117)
BILIRUBIN TOTAL: 0.5 mg/dL (ref 0.2–1.2)
BUN: 16 mg/dL (ref 6–23)
CHLORIDE: 104 meq/L (ref 96–112)
CO2: 27 mEq/L (ref 19–32)
CREATININE: 0.78 mg/dL (ref 0.40–1.20)
Calcium: 9.2 mg/dL (ref 8.4–10.5)
GFR: 102.68 mL/min (ref >60.00)
GLUCOSE: 104 mg/dL — AB (ref 70–99)
Potassium: 4.4 mEq/L (ref 3.5–5.1)
SODIUM: 139 meq/L (ref 135–145)
Total Protein: 6.9 g/dL (ref 6.0–8.3)

## 2016-10-12 LAB — CBC
HCT: 38.4 % (ref 36.0–46.0)
Hemoglobin: 12.8 g/dL (ref 12.0–15.0)
MCHC: 33.3 g/dL (ref 30.0–36.0)
MCV: 94.3 fl (ref 78.0–100.0)
Platelets: 284 10*3/uL (ref 150.0–400.0)
RBC: 4.07 Mil/uL (ref 3.87–5.11)
RDW: 14.2 % (ref 11.5–15.5)
WBC: 8.1 10*3/uL (ref 4.0–10.5)

## 2016-10-12 LAB — POCT URINALYSIS DIPSTICK
Bilirubin, UA: NEGATIVE
GLUCOSE UA: NEGATIVE
Ketones, UA: NEGATIVE
Leukocytes, UA: NEGATIVE
Nitrite, UA: NEGATIVE
Protein, UA: NEGATIVE
UROBILINOGEN UA: 0.2
pH, UA: 5

## 2016-10-12 LAB — VITAMIN B12: Vitamin B-12: 202 pg/mL — ABNORMAL LOW (ref 211–911)

## 2016-10-12 LAB — TSH: TSH: 1.14 u[IU]/mL (ref 0.35–4.50)

## 2016-10-12 LAB — POCT URINE PREGNANCY: PREG TEST UR: NEGATIVE

## 2016-10-12 LAB — VITAMIN D 25 HYDROXY (VIT D DEFICIENCY, FRACTURES): VITD: 7.52 ng/mL — ABNORMAL LOW (ref 30.00–100.00)

## 2016-10-12 NOTE — Patient Instructions (Signed)
Please go to the lab for blood work. I will call you with your results.  Please continue checking BP at home.  I would like for you to bring in your home cuff so we can check BP with it and with ours at the same time to verify home cuff is accurate. Watch salt intake. Continue current metoprolol dosage. BP at lower end today so would not increase or add on anything at present.  Please follow-up for breast imaging as scheduled. You will be contacted by GYN for assessment and ultrasonography to take a further look at symptoms.   I am also scheduling your for an echocardiogram to assess the symptoms at night when laying down.

## 2016-10-12 NOTE — Progress Notes (Signed)
Patient presents to clinic today c/o vaginal spotting over the past few weeks. Endorses going from most recent period on 09/24/16 which was normal per patient to multiple episodes of spotting since that time. Denies vaginal pain, discharge or lesion. Is sexually active with one long-term female partner. Denies dyspareunia. Denies change to bowel or bladder habits. Denies melena, hematochezia, tenesmus or hematuria. Denies fever, chills. Notes fatigue with some increased migraines. Is followed by Neurology for chronic headaches but has not follow-up as scheduled.  Denies change to diet or stress levels. Endorses fatigue despite restful sleep at night. Denies chest pain. Notes some occasional SOB if exerting herself but states this is mild. Body mass index is 36.49 kg/m.Marland Kitchen   Of note, at last visit patient ws seen for nipple discharge and breast tenderness. Cytology was inconclusive. Patient scheduled for Mammogram and [place] but did not have completed. Has rescheduled imaging for this Friday. Denies any residual nipple discharge or breast discomfort.   Past Medical History:  Diagnosis Date  . GERD (gastroesophageal reflux disease)   . HA (headache)   . History of chicken pox   . Migraine     Current Outpatient Prescriptions on File Prior to Visit  Medication Sig Dispense Refill  . metoprolol tartrate (LOPRESSOR) 25 MG tablet Take 1 tablet (25 mg total) by mouth 2 (two) times daily. 180 tablet 3   No current facility-administered medications on file prior to visit.     Allergies  Allergen Reactions  . Vicodin [Hydrocodone-Acetaminophen] Nausea And Vomiting    Family History  Problem Relation Age of Onset  . Congestive Heart Failure Mother 59    Deceased  . Stroke Mother   . Diabetes Mother   . Stroke Brother     multiple   . Hypertension Brother     x3  . Hypertension Sister     x4  . Migraines Sister   . Diabetes Sister   . Diabetes Brother   . Diabetes Maternal Grandmother   .  Breast cancer Maternal Aunt   . Heart attack Maternal Uncle   . Healthy Son     x1  . Healthy Daughter     x2    Social History   Social History  . Marital status: Single    Spouse name: N/A  . Number of children: N/A  . Years of education: 27   Social History Main Topics  . Smoking status: Never Smoker  . Smokeless tobacco: Never Used  . Alcohol use 0.0 oz/week     Comment: rare   . Drug use: No  . Sexual activity: No   Other Topics Concern  . None   Social History Narrative   Lives w/ sister   Caffeine use:  Tea and soda- 2-3 per day    Review of Systems - See HPI.  All other ROS are negative.  BP 100/68   Pulse 80   Temp 97.8 F (36.6 C) (Oral)   Resp 16   Ht _0  (1.702 m)   Wt 233 lb (105.7 kg)   LMP 09/24/2016   SpO2 98%   BMI 36.49 kg/m   Physical Exam  Constitutional: She is oriented to person, place, and time and well-developed, well-nourished, and in no distress.  HENT:  Head: Normocephalic and atraumatic.  Eyes: Conjunctivae are normal.  Neck: Neck supple. No thyromegaly present.  Cardiovascular: Normal rate, regular rhythm, normal heart sounds and intact distal pulses.   Pulmonary/Chest: Effort normal and breath sounds  normal. No respiratory distress. She has no wheezes. She has no rales. She exhibits no tenderness.  Abdominal: Soft. Bowel sounds are normal. She exhibits no distension and no mass. There is no tenderness. There is no rebound and no guarding.  Genitourinary:  Genitourinary Comments: Defers GU exam today  Lymphadenopathy:    She has no cervical adenopathy.  Neurological: She is alert and oriented to person, place, and time.  Skin: Skin is warm and dry. No rash noted.  Psychiatric: Affect normal.  Vitals reviewed.  Recent Results (from the past 2160 hour(s))  Basic metabolic panel     Status: Abnormal   Collection Time: 07/19/16  7:40 PM  Result Value Ref Range   Sodium 135 135 - 145 mmol/L   Potassium 3.5 3.5 - 5.1 mmol/L    Chloride 105 101 - 111 mmol/L   CO2 24 22 - 32 mmol/L   Glucose, Bld 92 65 - 99 mg/dL   BUN 11 6 - 20 mg/dL   Creatinine, Ser 0.94 0.44 - 1.00 mg/dL   Calcium 8.7 (L) 8.9 - 10.3 mg/dL   GFR calc non Af Amer >60 >60 mL/min   GFR calc Af Amer >60 >60 mL/min    Comment: (NOTE) The eGFR has been calculated using the CKD EPI equation. This calculation has not been validated in all clinical situations. eGFR's persistently <60 mL/min signify possible Chronic Kidney Disease.    Anion gap 6 5 - 15  CBC     Status: Abnormal   Collection Time: 07/19/16  7:40 PM  Result Value Ref Range   WBC 10.6 (H) 4.0 - 10.5 K/uL   RBC 3.86 (L) 3.87 - 5.11 MIL/uL   Hemoglobin 12.3 12.0 - 15.0 g/dL   HCT 36.9 36.0 - 46.0 %   MCV 95.6 78.0 - 100.0 fL   MCH 31.9 26.0 - 34.0 pg   MCHC 33.3 30.0 - 36.0 g/dL   RDW 12.6 11.5 - 15.5 %   Platelets 337 150 - 400 K/uL  POCT urine pregnancy     Status: Normal   Collection Time: 07/20/16  4:32 PM  Result Value Ref Range   Preg Test, Ur Negative Negative  Prolactin     Status: None   Collection Time: 07/20/16  4:41 PM  Result Value Ref Range   Prolactin 7.3 ng/mL    Comment: Reference Range Adult Female  Non-Pregnant 3.0-30.0 ng/mL  Pregnant 10.0-209.0 ng/mL  Postmenopausal 2.0-20.0 ng/mL     TSH     Status: None   Collection Time: 07/20/16  4:53 PM  Result Value Ref Range   TSH 0.67 mIU/L    Comment:   Reference Range   > or = 20 Years  0.40-4.50   Pregnancy Range First trimester  0.26-2.66 Second trimester 0.55-2.73 Third trimester  0.43-2.91     Cytology - non gyn     Status: None   Collection Time: 07/24/16  4:50 PM  Result Value Ref Range   FINAL DIAGNOSIS: SEE NOTE     Comment: - NO MALIGNANT CELLS IDENTIFIED. There are foam cells and/or benign duct cells in a lipoproteinaceous background.  These features are suggestive of galactorrhea or duct ectasia.  Recommend clinical correlation. - EVALUATION LIMITED DUE TO HYPOCELLULARITY.     Cytotechnologist: SEE NOTE     Comment: JLT, BS CT(ASCP)   Pathologist: SEE NOTE     Comment: Reviewed by Tawana Scale. Fields, MD, FCAP Paramedic on File)    Assessment/Plan: 1. Vaginal spotting  Urine dip with blood secondary to vaginal spotting. Urine pregnancy negative. Defers GU examination today. Discussed need for further assessment including examination and imaging. After discussion, decision to refer to GYN for full evaluation was made. Referral placed. Alarm signs/symptoms reviewed. Will check lab workup today due to constellation of symptoms.  - Ambulatory referral to Gynecology - POCT Urinalysis Dipstick - POCT urine pregnancy  2. Fatigue, unspecified type Feel multifactorial. Will check lab assessment today. Discussed proper diet and exercise regimen.  - CBC - Comp Met (CMET) - TSH - B12 - Vitamin D (25 hydroxy)  3. PND (paroxysmal nocturnal dyspnea) Questionable but symptom endorsed by patient fits this. No sign of fluid overload on exam. Lungs CTAB. EKG obtained revealing sinus rhythm with T abnormality, unchanged from prior tracings.  No acute concerning findings. Order for Echocardiogram placed for further assessment.  - EKG 12-Lead   Leeanne Rio, PA-C

## 2016-10-12 NOTE — Progress Notes (Signed)
Pre visit review using our clinic review tool, if applicable. No additional management support is needed unless otherwise documented below in the visit note. 

## 2016-10-15 ENCOUNTER — Other Ambulatory Visit: Payer: Self-pay | Admitting: Physician Assistant

## 2016-10-15 DIAGNOSIS — E559 Vitamin D deficiency, unspecified: Secondary | ICD-10-CM

## 2016-10-15 DIAGNOSIS — E538 Deficiency of other specified B group vitamins: Secondary | ICD-10-CM

## 2016-10-15 MED ORDER — VITAMIN D (ERGOCALCIFEROL) 1.25 MG (50000 UNIT) PO CAPS: 50000.0000 [IU] | ORAL_CAPSULE | ORAL | 0 refills | Status: DC

## 2016-10-15 MED ORDER — VITAMIN B-12 1000 MCG PO TABS: 1000.0000 ug | ORAL_TABLET | Freq: Every day | ORAL | 0 refills | Status: DC

## 2016-10-18 DIAGNOSIS — R0600 Dyspnea, unspecified: Secondary | ICD-10-CM | POA: Insufficient documentation

## 2016-10-18 DIAGNOSIS — R5383 Other fatigue: Secondary | ICD-10-CM | POA: Insufficient documentation

## 2016-10-18 DIAGNOSIS — N939 Abnormal uterine and vaginal bleeding, unspecified: Secondary | ICD-10-CM | POA: Insufficient documentation

## 2016-10-19 ENCOUNTER — Other Ambulatory Visit: Payer: BLUE CROSS/BLUE SHIELD

## 2016-11-01 ENCOUNTER — Encounter: Payer: Self-pay | Admitting: Emergency Medicine

## 2016-11-01 ENCOUNTER — Other Ambulatory Visit (HOSPITAL_COMMUNITY)
Admission: RE | Admit: 2016-11-01 | Discharge: 2016-11-01 | Disposition: A | Discharge status: Home / Self Care | Payer: BLUE CROSS/BLUE SHIELD | Source: Ambulatory Visit | Attending: Physician Assistant | Admitting: Physician Assistant

## 2016-11-01 ENCOUNTER — Encounter: Payer: Self-pay | Admitting: Physician Assistant

## 2016-11-01 ENCOUNTER — Ambulatory Visit (INDEPENDENT_AMBULATORY_CARE_PROVIDER_SITE_OTHER): Payer: BLUE CROSS/BLUE SHIELD | Admitting: Physician Assistant

## 2016-11-01 VITALS — BP 120/68 | HR 79 | Temp 98.0°F | Resp 14 | Ht 67.0 in | Wt 237.0 lb

## 2016-11-01 DIAGNOSIS — M545 Low back pain, unspecified: Secondary | ICD-10-CM

## 2016-11-01 DIAGNOSIS — M79632 Pain in left forearm: Secondary | ICD-10-CM

## 2016-11-01 DIAGNOSIS — R1013 Epigastric pain: Secondary | ICD-10-CM | POA: Insufficient documentation

## 2016-11-01 LAB — COMPREHENSIVE METABOLIC PANEL
ALBUMIN: 4.1 g/dL (ref 3.5–5.2)
ALT: 67 U/L — AB (ref 0–35)
AST: 18 U/L (ref 0–37)
Alkaline Phosphatase: 82 U/L (ref 39–117)
BUN: 9 mg/dL (ref 6–23)
CO2: 30 mEq/L (ref 19–32)
Calcium: 9.3 mg/dL (ref 8.4–10.5)
Chloride: 102 mEq/L (ref 96–112)
Creatinine, Ser: 0.87 mg/dL (ref 0.40–1.20)
GFR: 90.5 mL/min (ref >60.00)
GLUCOSE: 103 mg/dL — AB (ref 70–99)
Potassium: 4.3 mEq/L (ref 3.5–5.1)
SODIUM: 137 meq/L (ref 135–145)
TOTAL PROTEIN: 7 g/dL (ref 6.0–8.3)
Total Bilirubin: 0.7 mg/dL (ref 0.2–1.2)

## 2016-11-01 LAB — H. PYLORI ANTIBODY, IGG: H Pylori IgG: NEGATIVE

## 2016-11-01 LAB — POCT URINALYSIS DIPSTICK
BILIRUBIN UA: NEGATIVE
Glucose, UA: NEGATIVE
Ketones, UA: NEGATIVE
Leukocytes, UA: NEGATIVE
NITRITE UA: NEGATIVE
PH UA: 5
Protein, UA: NEGATIVE
Spec Grav, UA: >=1.03
UROBILINOGEN UA: 0.2

## 2016-11-01 LAB — LIPASE: LIPASE: 4 U/L — AB (ref 11.0–59.0)

## 2016-11-01 MED ORDER — CYCLOBENZAPRINE HCL 10 MG PO TABS: 10.0000 mg | ORAL_TABLET | Freq: Every day | ORAL | 0 refills | Status: DC

## 2016-11-01 MED ORDER — PANTOPRAZOLE SODIUM 40 MG PO TBEC: 40.0000 mg | DELAYED_RELEASE_TABLET | Freq: Every day | ORAL | 3 refills | Status: DC

## 2016-11-01 MED ORDER — TRAMADOL HCL 50 MG PO TABS: 50.0000 mg | ORAL_TABLET | Freq: Two times a day (BID) | ORAL | 0 refills | Status: DC | PRN

## 2016-11-01 NOTE — Progress Notes (Signed)
Pre visit review using our clinic review tool, if applicable. No additional management support is needed unless otherwise documented below in the visit note. 

## 2016-11-01 NOTE — Patient Instructions (Signed)
Please stay well-hydrated and get plenty of rest. Please take Flexeril at night as directed to help alleviate muscle tension. Try to make sure you are getting good posture while sleeping.  Use Tramadol as directed for back pain and arm pain. Apply icy hot or aspercreme to the arm.  If not improving, would want imaging of the arm.  For the stomach, I am starting you on Protonix to help reduce acid in the stomach that I feel is contributing.  I am also checking a lab panel today.  Please follow the diet below.  Follow-up with me in 1 week.  Make sure to call the breast center and reschedule your imaging.   Food Choices for Gastroesophageal Reflux Disease, Adult When you have gastroesophageal reflux disease (GERD), the foods you eat and your eating habits are very important. Choosing the right foods can help ease your discomfort. What guidelines do I need to follow?  Choose fruits, vegetables, whole grains, and low-fat dairy products.  Choose low-fat meat, fish, and poultry.  Limit fats such as oils, salad dressings, butter, nuts, and avocado.  Keep a food diary. This helps you identify foods that cause symptoms.  Avoid foods that cause symptoms. These may be different for everyone.  Eat small meals often instead of 3 large meals a day.  Eat your meals slowly, in a place where you are relaxed.  Limit fried foods.  Cook foods using methods other than frying.  Avoid drinking alcohol.  Avoid drinking large amounts of liquids with your meals.  Avoid bending over or lying down until 2-3 hours after eating. What foods are not recommended? These are some foods and drinks that may make your symptoms worse: Vegetables  Tomatoes. Tomato juice. Tomato and spaghetti sauce. Chili peppers. Onion and garlic. Horseradish. Fruits  Oranges, grapefruit, and lemon (fruit and juice). Meats  High-fat meats, fish, and poultry. This includes hot dogs, ribs, ham, sausage, salami, and  bacon. Dairy  Whole milk and chocolate milk. Sour cream. Cream. Butter. Ice cream. Cream cheese. Drinks  Coffee and tea. Bubbly (carbonated) drinks or energy drinks. Condiments  Hot sauce. Barbecue sauce. Sweets/Desserts  Chocolate and cocoa. Donuts. Peppermint and spearmint. Fats and Oils  High-fat foods. This includes [place] fries and potato chips. Other  Vinegar. Strong spices. This includes black pepper, white pepper, red pepper, cayenne, curry powder, cloves, ginger, and chili powder. The items listed above may not be a complete list of foods and drinks to avoid. Contact your dietitian for more information.  This information is not intended to replace advice given to you by your health care provider. Make sure you discuss any questions you have with your health care provider. Document Released: 03/18/2012 Document Revised: 02/23/2016 Document Reviewed: 07/22/2013 Elsevier Interactive Patient Education  2017 [employer]

## 2016-11-02 ENCOUNTER — Other Ambulatory Visit: Payer: Self-pay | Admitting: Physician Assistant

## 2016-11-02 DIAGNOSIS — R945 Abnormal results of liver function studies: Principal | ICD-10-CM

## 2016-11-02 DIAGNOSIS — R7989 Other specified abnormal findings of blood chemistry: Secondary | ICD-10-CM

## 2016-11-05 LAB — URINE CYTOLOGY ANCILLARY ONLY
BACTERIAL VAGINITIS: NEGATIVE
CANDIDA VAGINITIS: NEGATIVE

## 2016-11-06 NOTE — Progress Notes (Signed)
Patient presents to clinic today c/o pain in left forearm described as a soreness. Has been present for 2 days. Denies trauma or injury. Denies numbness, tingling, swelling. Denies radiation of pain. Endorses some heavy lifting recently. Sleeps on her left side. Denies chest pain, palpitations, lightheadedness or dizziness.   Patient does note increased heart burn with bloating and occasional epigastric discomfort over the past few weeks. Endorses poor diet. Had history of GERD but is not taking anything at present. Symptoms worse with laying down. Denies nausea or vomiting. Denies change to bowel habits. Notes some odor of urine without urinary urgency, hesitancy, dysuria or hematuria. Has been using OTC NSAIDs.   Patient also notes some bilateral lower back pain described as tension and knotting. Denies trauma or injury. Again some lifting. Denies radiation of pain into lower extremities.   Past Medical History:  Diagnosis Date  . GERD (gastroesophageal reflux disease)   . HA (headache)   . History of chicken pox   . Migraine     Current Outpatient Prescriptions on File Prior to Visit  Medication Sig Dispense Refill  . metoprolol tartrate (LOPRESSOR) 25 MG tablet Take 1 tablet (25 mg total) by mouth 2 (two) times daily. 180 tablet 3  . vitamin B-12 (CYANOCOBALAMIN) 1000 MCG tablet Take 1 tablet (1,000 mcg total) by mouth daily. (Patient not taking: Reported on 11/01/2016) 30 tablet 0   No current facility-administered medications on file prior to visit.     Allergies  Allergen Reactions  . Vicodin [Hydrocodone-Acetaminophen] Nausea And Vomiting    Family History  Problem Relation Age of Onset  . Congestive Heart Failure Mother 39    Deceased  . Stroke Mother   . Diabetes Mother   . Stroke Brother     multiple   . Hypertension Brother     x3  . Hypertension Sister     x4  . Migraines Sister   . Diabetes Sister   . Diabetes Brother   . Diabetes Maternal Grandmother   .  Breast cancer Maternal Aunt   . Heart attack Maternal Uncle   . Healthy Son     x1  . Healthy Daughter     x2    Social History   Social History  . Marital status: Single    Spouse name: N/A  . Number of children: N/A  . Years of education: 13   Social History Main Topics  . Smoking status: Never Smoker  . Smokeless tobacco: Never Used  . Alcohol use 0.0 oz/week     Comment: rare   . Drug use: No  . Sexual activity: No   Other Topics Concern  . None   Social History Narrative   Lives w/ sister   Caffeine use:  Tea and soda- 2-3 per day   Review of Systems - See HPI.  All other ROS are negative.  BP 120/68   Pulse 79   Temp 98 F (36.7 C) (Oral)   Resp 14   Ht 5' 7" (1.702 m)   Wt 237 lb (107.5 kg)   SpO2 99%   BMI 37.12 kg/m   Physical Exam  Constitutional: She is oriented to person, place, and time and well-developed, well-nourished, and in no distress.  HENT:  Head: Normocephalic and atraumatic.  Eyes: Conjunctivae are normal.  Neck: Neck supple.  Cardiovascular: Normal rate, regular rhythm, normal heart sounds and intact distal pulses.   Pulmonary/Chest: Effort normal and breath sounds normal. No respiratory distress. She  has no wheezes. She has no rales. She exhibits no tenderness.  Abdominal: Soft. Bowel sounds are normal. She exhibits no distension and no mass. There is tenderness in the epigastric area and left upper quadrant. There is no rebound and no guarding.  Musculoskeletal:       Left elbow: Normal.       Left wrist: Normal.       Lumbar back: She exhibits tenderness, bony tenderness and spasm. She exhibits normal range of motion.       Left forearm: She exhibits tenderness. She exhibits no bony tenderness, no swelling, no edema and no deformity.       Left hand: Normal.  Neurological: She is alert and oriented to person, place, and time.  Skin: Skin is warm and dry. No rash noted.  Psychiatric: Affect normal.  Vitals reviewed.  Recent  Results (from the past 2160 hour(s))  POCT Urinalysis Dipstick     Status: Abnormal   Collection Time: 10/12/16 11:53 AM  Result Value Ref Range   Color, UA yellow    Clarity, UA clear    Glucose, UA negative    Bilirubin, UA negative    Ketones, UA negative    Spec Grav, UA >=1.030    Blood, UA 2+    pH, UA 5.0    Protein, UA negative    Urobilinogen, UA 0.2    Nitrite, UA negative    Leukocytes, UA Negative Negative  POCT urine pregnancy     Status: Normal   Collection Time: 10/12/16 11:53 AM  Result Value Ref Range   Preg Test, Ur Negative Negative  CBC     Status: None   Collection Time: 10/12/16 12:02 PM  Result Value Ref Range   WBC 8.1 4.0 - 10.5 K/uL   RBC 4.07 3.87 - 5.11 Mil/uL   Platelets 284.0 150.0 - 400.0 K/uL   Hemoglobin 12.8 12.0 - 15.0 g/dL   HCT 38.4 36.0 - 46.0 %   MCV 94.3 78.0 - 100.0 fl   MCHC 33.3 30.0 - 36.0 g/dL   RDW 14.2 11.5 - 15.5 %  Comp Met (CMET)     Status: Abnormal   Collection Time: 10/12/16 12:02 PM  Result Value Ref Range   Sodium 139 135 - 145 mEq/L   Potassium 4.4 3.5 - 5.1 mEq/L   Chloride 104 96 - 112 mEq/L   CO2 27 19 - 32 mEq/L   Glucose, Bld 104 (H) 70 - 99 mg/dL   BUN 16 6 - 23 mg/dL   Creatinine, Ser 0.78 0.40 - 1.20 mg/dL   Total Bilirubin 0.5 0.2 - 1.2 mg/dL   Alkaline Phosphatase 83 39 - 117 U/L   AST 19 0 - 37 U/L   ALT 17 0 - 35 U/L   Total Protein 6.9 6.0 - 8.3 g/dL   Albumin 4.2 3.5 - 5.2 g/dL   Calcium 9.2 8.4 - 10.5 mg/dL   GFR 102.68 >60.00 mL/min  TSH     Status: None   Collection Time: 10/12/16 12:02 PM  Result Value Ref Range   TSH 1.14 0.35 - 4.50 uIU/mL  B12     Status: Abnormal   Collection Time: 10/12/16 12:02 PM  Result Value Ref Range   Vitamin B-12 202 (L) 211 - 911 pg/mL  Vitamin D (25 hydroxy)     Status: Abnormal   Collection Time: 10/12/16 12:02 PM  Result Value Ref Range   VITD 7.52 (L) 30.00 - 100.00 ng/mL  Urine  cytology ancillary only     Status: None   Collection Time: 11/01/16  12:00 AM  Result Value Ref Range   Bacterial vaginitis Negative for Bacterial Vaginitis Microorganisms     Comment: Normal Reference Range - Negative   Candida vaginitis Negative for Candida Vaginitis Microorganisms     Comment: Normal Reference Range - Negative  H. pylori antibody, IgG     Status: None   Collection Time: 11/01/16 10:19 AM  Result Value Ref Range   H Pylori IgG Negative Negative  Lipase     Status: Abnormal   Collection Time: 11/01/16 10:19 AM  Result Value Ref Range   Lipase 4.0 (L) 11.0 - 59.0 U/L  Comp Met (CMET)     Status: Abnormal   Collection Time: 11/01/16 10:19 AM  Result Value Ref Range   Sodium 137 135 - 145 mEq/L   Potassium 4.3 3.5 - 5.1 mEq/L   Chloride 102 96 - 112 mEq/L   CO2 30 19 - 32 mEq/L   Glucose, Bld 103 (H) 70 - 99 mg/dL   BUN 9 6 - 23 mg/dL   Creatinine, Ser 0.87 0.40 - 1.20 mg/dL   Total Bilirubin 0.7 0.2 - 1.2 mg/dL   Alkaline Phosphatase 82 39 - 117 U/L   AST 18 0 - 37 U/L   ALT 67 (H) 0 - 35 U/L   Total Protein 7.0 6.0 - 8.3 g/dL   Albumin 4.1 3.5 - 5.2 g/dL   Calcium 9.3 8.4 - 10.5 mg/dL   GFR 90.50 >60.00 mL/min  POCT Urinalysis Dipstick     Status: Abnormal   Collection Time: 11/01/16 10:27 AM  Result Value Ref Range   Color, UA yellow    Clarity, UA clear    Glucose, UA negative    Bilirubin, UA negative    Ketones, UA negative    Spec Grav, UA >=1.030    Blood, UA trace    pH, UA 5.0    Protein, UA negative    Urobilinogen, UA 0.2    Nitrite, UA negative    Leukocytes, UA Negative Negative    Assessment/Plan: 1. Epigastric pain Mild epigastric and LUQ tenderness today in patient with GERD, not-treating. Likely exacerbated by NSAIDs.  Body mass index is 37.12 kg/m. Poor diet. Will start Protonix. Labs panel today to include H. Pylori testing. Start GERD diet. FU scheduled.   - H. pylori antibody, IgG - Lipase - Comp Met (CMET) - pantoprazole (PROTONIX) 40 MG tablet; Take 1 tablet (40 mg total) by mouth daily.   Dispense: 30 tablet; Refill: 3 - Urine cytology ancillary only  2. Acute bilateral low back pain without sciatica Mild. Supportive measures discussed. Rx Flexeril. Tylenol OTC for pain. Avoid NSAID use. FU if not quickly improving.   - cyclobenzaprine (FLEXERIL) 10 MG tablet; Take 1 tablet (10 mg total) by mouth at bedtime.  Dispense: 15 tablet; Refill: 0 - POCT Urinalysis Dipstick - Urine cytology ancillary only  3. Left forearm pain Improving today per patient. Mild tenderness at present without swelling or deformity. Likely from laying on affected side and heavy lifting. Supportive measures reviewed.    Leeanne Rio, PA-C

## 2016-11-15 ENCOUNTER — Other Ambulatory Visit (HOSPITAL_COMMUNITY): Payer: BLUE CROSS/BLUE SHIELD

## 2016-11-15 ENCOUNTER — Telehealth (HOSPITAL_COMMUNITY): Payer: Self-pay | Admitting: Physician Assistant

## 2016-11-21 NOTE — Telephone Encounter (Signed)
  11/15/2016 08:39 AM Phone (Outgoing) [place], Joni Reiningicole (Self) [phone] (H)   Left Message - Called pt and lmsg for her to CB to r/s her echo .    By Elita Booneegina A Rommie Dunn

## 2016-11-23 ENCOUNTER — Ambulatory Visit
Admission: RE | Admit: 2016-11-23 | Discharge: 2016-11-23 | Disposition: A | Discharge status: Home / Self Care | Payer: PRIVATE HEALTH INSURANCE | Source: Ambulatory Visit | Attending: Physician Assistant | Admitting: Physician Assistant

## 2016-11-23 ENCOUNTER — Other Ambulatory Visit: Payer: Self-pay | Admitting: Physician Assistant

## 2016-11-23 ENCOUNTER — Ambulatory Visit
Admission: RE | Admit: 2016-11-23 | Discharge: 2016-11-23 | Disposition: A | Discharge status: Home / Self Care | Payer: Self-pay | Source: Ambulatory Visit | Attending: Physician Assistant | Admitting: Physician Assistant

## 2016-11-23 DIAGNOSIS — N6452 Nipple discharge: Secondary | ICD-10-CM

## 2016-11-23 DIAGNOSIS — N644 Mastodynia: Secondary | ICD-10-CM

## 2016-11-23 HISTORY — DX: Nipple discharge: N64.52

## 2016-12-04 ENCOUNTER — Telehealth: Payer: Self-pay | Admitting: Physician Assistant

## 2016-12-04 ENCOUNTER — Other Ambulatory Visit (HOSPITAL_COMMUNITY): Payer: BLUE CROSS/BLUE SHIELD

## 2016-12-04 NOTE — Telephone Encounter (Signed)
Patient Name: Kamerin Rosamilia DOB: 07-09-1971 Initial Comment Caller states she has difficulty breathing mainly when laying down, headache flare up Nurse Assessment Nurse: Lane HackerHarley, RN, Elvin SoWindy Date/Time (Eastern Time): 12/04/2016 10:45:12 AM Confirm and document reason for call. If symptomatic, describe symptoms. ---Caller states she has difficulty breathing mainly when laying down which has been going on longer than a month and worse 2 days ago where she wakes up from sleep gasping for air, and having to sit up constantly thru the night. Been coughing all night, non productive. Also, c/o headache flare up, started Friday. Rates pain at 8/10. Does the patient have any new or worsening symptoms? ---Yes Will a triage be completed? ---Yes Related visit to physician within the last 2 weeks? ---No Does the PT have any chronic conditions? (i.e. diabetes, asthma, etc.) ---Yes List chronic conditions. ---HTN Is the patient pregnant or possibly pregnant? (Ask all females between the ages of 6312-55) ---No Is this a behavioral health or substance abuse call? ---No Guidelines Guideline Title Affirmed Question Affirmed Notes Cough - Acute Non-Productive Difficulty breathing Final Disposition User Go to ED Now Lane HackerHarley, RN, Windy Referrals GO TO FACILITY OTHER - SPECIFY Disagree/Comply: Comply

## 2016-12-04 NOTE — Telephone Encounter (Signed)
Can we verify pt went to the ER?

## 2016-12-04 NOTE — Telephone Encounter (Signed)
LM requesting call back to confirm patient went the the Emergency Department. Will continue to attempt to contact patient.

## 2016-12-05 NOTE — Telephone Encounter (Signed)
Spoke with patient regarding symptoms and ED visit. Patient states she did not go to the Emergency Department as advised by Triage yesterday. Patient states she was not having difficulty breathing at the time of call. Patient reports regurgitation/reflux at night that she can feel in her throat. Patient declines shortness of breath or difficulty breathing at this time and is requesting an appointment with PCP to discuss reflux. Appointment scheduled for 12/06/2016.

## 2016-12-06 ENCOUNTER — Encounter: Payer: Self-pay | Admitting: Physician Assistant

## 2016-12-06 ENCOUNTER — Ambulatory Visit (INDEPENDENT_AMBULATORY_CARE_PROVIDER_SITE_OTHER): Payer: BLUE CROSS/BLUE SHIELD | Admitting: Physician Assistant

## 2016-12-06 VITALS — BP 120/78 | HR 81 | Temp 97.9°F | Resp 14 | Ht 67.0 in | Wt 231.0 lb

## 2016-12-06 DIAGNOSIS — J329 Chronic sinusitis, unspecified: Secondary | ICD-10-CM

## 2016-12-06 DIAGNOSIS — E538 Deficiency of other specified B group vitamins: Secondary | ICD-10-CM

## 2016-12-06 DIAGNOSIS — E559 Vitamin D deficiency, unspecified: Secondary | ICD-10-CM

## 2016-12-06 DIAGNOSIS — R945 Abnormal results of liver function studies: Secondary | ICD-10-CM

## 2016-12-06 DIAGNOSIS — K2100 Gastro-esophageal reflux disease with esophagitis, without bleeding: Secondary | ICD-10-CM

## 2016-12-06 DIAGNOSIS — B9789 Other viral agents as the cause of diseases classified elsewhere: Secondary | ICD-10-CM

## 2016-12-06 DIAGNOSIS — R7989 Other specified abnormal findings of blood chemistry: Secondary | ICD-10-CM | POA: Diagnosis not present

## 2016-12-06 LAB — HEPATIC FUNCTION PANEL
ALK PHOS: 78 U/L (ref 39–117)
ALT: 18 U/L (ref 0–35)
AST: 20 U/L (ref 0–37)
Albumin: 4.4 g/dL (ref 3.5–5.2)
BILIRUBIN DIRECT: 0.1 mg/dL (ref 0.0–0.3)
TOTAL PROTEIN: 7.4 g/dL (ref 6.0–8.3)
Total Bilirubin: 0.6 mg/dL (ref 0.2–1.2)

## 2016-12-06 LAB — VITAMIN B12: Vitamin B-12: 215 pg/mL (ref 211–911)

## 2016-12-06 LAB — VITAMIN D 25 HYDROXY (VIT D DEFICIENCY, FRACTURES): VITD: 17.49 ng/mL — ABNORMAL LOW (ref 30.00–100.00)

## 2016-12-06 MED ORDER — SUCRALFATE 1 G PO TABS: 1.0000 g | ORAL_TABLET | Freq: Three times a day (TID) | ORAL | 0 refills | Status: DC

## 2016-12-06 MED ORDER — RANITIDINE HCL 300 MG PO TABS: 300.0000 mg | ORAL_TABLET | Freq: Every day | ORAL | 0 refills | Status: DC

## 2016-12-06 MED ORDER — FLUTICASONE PROPIONATE 50 MCG/ACT NA SUSP: 2.0000 | Freq: Every day | NASAL | 2 refills | Status: DC

## 2016-12-06 NOTE — Progress Notes (Signed)
Pre visit review using our clinic review tool, if applicable. No additional management support is needed unless otherwise documented below in the visit note. 

## 2016-12-06 NOTE — Patient Instructions (Addendum)
Please stay well-hydrated and get plenty of rest.  Continue the Protonix. Add on the Zantac nightly. We are trying to get better control over the reflux that has been worsened by sinus symptoms. Keep a humidifier in the bedroom. The carafate will help coat the stomach to help with acid irritation of the stomach lining.    Please use saline nasal rinses a couple of times per day. Use the Flonase as directed. Elevate the head of bed until we get this calmed down.  If not improving quickly with current measures we will have to get input from Gastroenterology.   Follow-up in 1 week.

## 2016-12-06 NOTE — Progress Notes (Signed)
Pt presents to clinic today for worsening reflux symptoms. Pt visited clinic on 11/01/2016 with reflux symptoms, for which she started Protonix 40 mg daily. Pt endorses taking medication as directed, states symptoms were improving overall until 2-3 days ago. Pt reports dry cough that has been pronounced at night when she lays down. States on Tuesday and Wednesday night she woke up from sleep feeling strangulated, states she had to sit up or drink water until feeling went away. Admits heartburn, belching, sore throat and dysphagia when she lays down at night. Pt states that she has been sleeping propped up to alleviate symptoms. Reports this has greatly affected sleep. Denies any symptoms after eating, difficulty swallowing food or eating right before sleep. Denies palpitations, chest pain, edema.  Pt reports congestion that occurred 2-3 days ago with body aches, arthralgia, and eye tearing. States she notices drainage as well.  Denies ear pain, ear fullness, runny nose, sore throat.     Past Medical History:  Diagnosis Date  . Breast discharge    bil unknown  time   . GERD (gastroesophageal reflux disease)   . HA (headache)   . History of chicken pox   . Migraine     Current Outpatient Prescriptions on File Prior to Visit  Medication Sig Dispense Refill  . cyclobenzaprine (FLEXERIL) 10 MG tablet Take 1 tablet (10 mg total) by mouth at bedtime. 15 tablet 0  . metoprolol tartrate (LOPRESSOR) 25 MG tablet Take 1 tablet (25 mg total) by mouth 2 (two) times daily. 180 tablet 3  . pantoprazole (PROTONIX) 40 MG tablet Take 1 tablet (40 mg total) by mouth daily. 30 tablet 3  . vitamin B-12 (CYANOCOBALAMIN) 1000 MCG tablet Take 1 tablet (1,000 mcg total) by mouth daily. 30 tablet 0   No current facility-administered medications on file prior to visit.     Allergies  Allergen Reactions  . Vicodin [Hydrocodone-Acetaminophen] Nausea And Vomiting    Family History  Problem Relation Age of  Onset  . Congestive Heart Failure Mother 50    Deceased  . Stroke Mother   . Diabetes Mother   . Stroke Brother     multiple   . Hypertension Brother     x3  . Hypertension Sister     x4  . Migraines Sister   . Diabetes Sister   . Diabetes Brother   . Diabetes Maternal Grandmother   . Breast cancer Maternal Aunt   . Heart attack Maternal Uncle   . Healthy Son     x1  . Healthy Daughter     x2    Social History   Social History  . Marital status: Single    Spouse name: N/A  . Number of children: N/A  . Years of education: 69   Social History Main Topics  . Smoking status: Never Smoker  . Smokeless tobacco: Never Used  . Alcohol use 0.0 oz/week     Comment: rare   . Drug use: No  . Sexual activity: No   Other Topics Concern  . None   Social History Narrative   Lives w/ sister   Caffeine use:  Tea and soda- 2-3 per day    Review of Systems - See HPI.  All other ROS are negative.  BP 120/78   Pulse 81   Temp 97.9 F (36.6 C) (Oral)   Resp 14   Ht '5\' 7"'$  (1.702 m)   Wt 231 lb (104.8 kg)   SpO2 98%  BMI 36.18 kg/m   Physical Exam  Constitutional: She is oriented to person, place, and time and well-developed, well-nourished, and in no distress.  HENT:  Right Ear: External ear normal. Tympanic membrane is not erythematous. A middle ear effusion is present.  Left Ear: External ear normal. Tympanic membrane is not erythematous. A middle ear effusion is present.  Nose: Mucosal edema (with turbinate erythema bilaterally) present. Right sinus exhibits no maxillary sinus tenderness and no frontal sinus tenderness. Left sinus exhibits no maxillary sinus tenderness and no frontal sinus tenderness.  Mouth/Throat: No oropharyngeal exudate, posterior oropharyngeal edema, posterior oropharyngeal erythema or tonsillar abscesses.  Eyes: Conjunctivae are normal. Pupils are equal, round, and reactive to light. Right eye exhibits no discharge. Left eye exhibits no discharge.  No scleral icterus.  Neck: Neck supple.  Cardiovascular: Normal rate, regular rhythm and normal heart sounds.  Exam reveals no gallop and no friction rub.   No murmur heard. Pulmonary/Chest: Effort normal and breath sounds normal. No respiratory distress. She has no wheezes. She has no rales.  Abdominal: Soft. Bowel sounds are normal. She exhibits no distension and no mass. There is no hepatosplenomegaly. There is tenderness in the left upper quadrant. There is no rebound and no guarding.  Lymphadenopathy:    She has no cervical adenopathy.  Neurological: She is alert and oriented to person, place, and time.  Skin: Skin is warm and dry. No rash noted. She is not diaphoretic. No erythema. No pallor.    Recent Results (from the past 2160 hour(s))  POCT Urinalysis Dipstick     Status: Abnormal   Collection Time: 10/12/16 11:53 AM  Result Value Ref Range   Color, UA yellow    Clarity, UA clear    Glucose, UA negative    Bilirubin, UA negative    Ketones, UA negative    Spec Grav, UA >=1.030    Blood, UA 2+    pH, UA 5.0    Protein, UA negative    Urobilinogen, UA 0.2    Nitrite, UA negative    Leukocytes, UA Negative Negative  POCT urine pregnancy     Status: Normal   Collection Time: 10/12/16 11:53 AM  Result Value Ref Range   Preg Test, Ur Negative Negative  CBC     Status: None   Collection Time: 10/12/16 12:02 PM  Result Value Ref Range   WBC 8.1 4.0 - 10.5 K/uL   RBC 4.07 3.87 - 5.11 Mil/uL   Platelets 284.0 150.0 - 400.0 K/uL   Hemoglobin 12.8 12.0 - 15.0 g/dL   HCT 38.4 36.0 - 46.0 %   MCV 94.3 78.0 - 100.0 fl   MCHC 33.3 30.0 - 36.0 g/dL   RDW 14.2 11.5 - 15.5 %  Comp Met (CMET)     Status: Abnormal   Collection Time: 10/12/16 12:02 PM  Result Value Ref Range   Sodium 139 135 - 145 mEq/L   Potassium 4.4 3.5 - 5.1 mEq/L   Chloride 104 96 - 112 mEq/L   CO2 27 19 - 32 mEq/L   Glucose, Bld 104 (H) 70 - 99 mg/dL   BUN 16 6 - 23 mg/dL   Creatinine, Ser 0.78 0.40 -  1.20 mg/dL   Total Bilirubin 0.5 0.2 - 1.2 mg/dL   Alkaline Phosphatase 83 39 - 117 U/L   AST 19 0 - 37 U/L   ALT 17 0 - 35 U/L   Total Protein 6.9 6.0 - 8.3 g/dL   Albumin 4.2  3.5 - 5.2 g/dL   Calcium 9.2 8.4 - 10.5 mg/dL   GFR 102.68 >60.00 mL/min  TSH     Status: None   Collection Time: 10/12/16 12:02 PM  Result Value Ref Range   TSH 1.14 0.35 - 4.50 uIU/mL  B12     Status: Abnormal   Collection Time: 10/12/16 12:02 PM  Result Value Ref Range   Vitamin B-12 202 (L) 211 - 911 pg/mL  Vitamin D (25 hydroxy)     Status: Abnormal   Collection Time: 10/12/16 12:02 PM  Result Value Ref Range   VITD 7.52 (L) 30.00 - 100.00 ng/mL  Urine cytology ancillary only     Status: None   Collection Time: 11/01/16 12:00 AM  Result Value Ref Range   Bacterial vaginitis Negative for Bacterial Vaginitis Microorganisms     Comment: Normal Reference Range - Negative   Candida vaginitis Negative for Candida Vaginitis Microorganisms     Comment: Normal Reference Range - Negative  H. pylori antibody, IgG     Status: None   Collection Time: 11/01/16 10:19 AM  Result Value Ref Range   H Pylori IgG Negative Negative  Lipase     Status: Abnormal   Collection Time: 11/01/16 10:19 AM  Result Value Ref Range   Lipase 4.0 (L) 11.0 - 59.0 U/L  Comp Met (CMET)     Status: Abnormal   Collection Time: 11/01/16 10:19 AM  Result Value Ref Range   Sodium 137 135 - 145 mEq/L   Potassium 4.3 3.5 - 5.1 mEq/L   Chloride 102 96 - 112 mEq/L   CO2 30 19 - 32 mEq/L   Glucose, Bld 103 (H) 70 - 99 mg/dL   BUN 9 6 - 23 mg/dL   Creatinine, Ser 0.87 0.40 - 1.20 mg/dL   Total Bilirubin 0.7 0.2 - 1.2 mg/dL   Alkaline Phosphatase 82 39 - 117 U/L   AST 18 0 - 37 U/L   ALT 67 (H) 0 - 35 U/L   Total Protein 7.0 6.0 - 8.3 g/dL   Albumin 4.1 3.5 - 5.2 g/dL   Calcium 9.3 8.4 - 10.5 mg/dL   GFR 90.50 >60.00 mL/min  POCT Urinalysis Dipstick     Status: Abnormal   Collection Time: 11/01/16 10:27 AM  Result Value Ref Range     Color, UA yellow    Clarity, UA clear    Glucose, UA negative    Bilirubin, UA negative    Ketones, UA negative    Spec Grav, UA >=1.030    Blood, UA trace    pH, UA 5.0    Protein, UA negative    Urobilinogen, UA 0.2    Nitrite, UA negative    Leukocytes, UA Negative Negative    Assessment/Plan: 1. GERD with esophagitis GERD exacerbated by significant pnd. Continue PPI. Add-on Zantac nightly for a few days. Carafate with meals. FU 1 week.  May need GI referral. - ranitidine (ZANTAC) 300 MG tablet; Take 1 tablet (300 mg total) by mouth at bedtime.  Dispense: 30 tablet; Refill: 0 - sucralfate (CARAFATE) 1 g tablet; Take 1 tablet (1 g total) by mouth 4 (four) times daily -  with meals and at bedtime.  Dispense: 60 tablet; Refill: 0  2. Viral sinusitis Start Flonase. OTC medications and supportive measures reviewed. - fluticasone (FLONASE) 50 MCG/ACT nasal spray; Place 2 sprays into both nostrils daily.  Dispense: 16 g; Refill: 2  3. Vitamin D deficiency Repeat level today. - VITAMIN  D 25 Hydroxy (Vit-D Deficiency, Fractures)  4. Vitamin B12 deficiency Repeat levels today. - Vitamin B12  5. Elevated LFTs Repeat LFTs today.  - Hepatic function panel   Leeanne Rio, PA-C

## 2016-12-11 ENCOUNTER — Other Ambulatory Visit: Payer: Self-pay | Admitting: Physician Assistant

## 2016-12-11 MED ORDER — VITAMIN D (ERGOCALCIFEROL) 1.25 MG (50000 UNIT) PO CAPS: 50000.0000 [IU] | ORAL_CAPSULE | ORAL | 0 refills | Status: DC

## 2016-12-12 ENCOUNTER — Telehealth: Payer: Self-pay | Admitting: Physician Assistant

## 2016-12-12 NOTE — Telephone Encounter (Signed)
Left message notify patient FMLA forms completed and faxed per pcp's request.

## 2016-12-12 NOTE — Telephone Encounter (Signed)
FMLA forms completed and faxed. Please inform patient. Thank you.

## 2016-12-19 ENCOUNTER — Other Ambulatory Visit (HOSPITAL_COMMUNITY): Payer: PRIVATE HEALTH INSURANCE

## 2017-01-02 ENCOUNTER — Other Ambulatory Visit (HOSPITAL_COMMUNITY): Payer: BLUE CROSS/BLUE SHIELD

## 2017-02-08 DIAGNOSIS — R5383 Other fatigue: Secondary | ICD-10-CM | POA: Diagnosis not present

## 2017-02-08 DIAGNOSIS — R200 Anesthesia of skin: Secondary | ICD-10-CM | POA: Diagnosis not present

## 2017-02-08 DIAGNOSIS — M545 Low back pain: Secondary | ICD-10-CM | POA: Diagnosis not present

## 2017-03-12 ENCOUNTER — Encounter: Payer: Self-pay | Admitting: Physician Assistant

## 2017-03-12 ENCOUNTER — Ambulatory Visit (INDEPENDENT_AMBULATORY_CARE_PROVIDER_SITE_OTHER): Payer: BLUE CROSS/BLUE SHIELD | Admitting: Physician Assistant

## 2017-03-12 VITALS — BP 118/70 | HR 76 | Temp 98.0°F | Resp 14 | Ht 67.0 in | Wt 231.0 lb

## 2017-03-12 DIAGNOSIS — N926 Irregular menstruation, unspecified: Secondary | ICD-10-CM

## 2017-03-12 DIAGNOSIS — R5382 Chronic fatigue, unspecified: Secondary | ICD-10-CM

## 2017-03-12 DIAGNOSIS — G4700 Insomnia, unspecified: Secondary | ICD-10-CM

## 2017-03-12 DIAGNOSIS — J208 Acute bronchitis due to other specified organisms: Secondary | ICD-10-CM | POA: Diagnosis not present

## 2017-03-12 DIAGNOSIS — B9689 Other specified bacterial agents as the cause of diseases classified elsewhere: Secondary | ICD-10-CM

## 2017-03-12 LAB — CBC WITH DIFFERENTIAL/PLATELET
BASOS PCT: 0.5 % (ref 0.0–3.0)
Basophils Absolute: 0.1 10*3/uL (ref 0.0–0.1)
EOS PCT: 0.7 % (ref 0.0–5.0)
Eosinophils Absolute: 0.1 10*3/uL (ref 0.0–0.7)
HEMATOCRIT: 38.2 % (ref 36.0–46.0)
HEMOGLOBIN: 12.6 g/dL (ref 12.0–15.0)
LYMPHS PCT: 17.9 % (ref 12.0–46.0)
Lymphs Abs: 2.3 10*3/uL (ref 0.7–4.0)
MCHC: 32.9 g/dL (ref 30.0–36.0)
MCV: 95.3 fl (ref 78.0–100.0)
MONOS PCT: 7.6 % (ref 3.0–12.0)
Monocytes Absolute: 1 10*3/uL (ref 0.1–1.0)
NEUTROS ABS: 9.5 10*3/uL — AB (ref 1.4–7.7)
Neutrophils Relative %: 73.3 % (ref 43.0–77.0)
PLATELETS: 302 10*3/uL (ref 150.0–400.0)
RBC: 4.01 Mil/uL (ref 3.87–5.11)
RDW: 14.5 % (ref 11.5–15.5)
WBC: 13 10*3/uL — AB (ref 4.0–10.5)

## 2017-03-12 LAB — COMPREHENSIVE METABOLIC PANEL
ALBUMIN: 4.1 g/dL (ref 3.5–5.2)
ALT: 13 U/L (ref 0–35)
AST: 16 U/L (ref 0–37)
Alkaline Phosphatase: 73 U/L (ref 39–117)
BUN: 11 mg/dL (ref 6–23)
CALCIUM: 9.5 mg/dL (ref 8.4–10.5)
CO2: 29 meq/L (ref 19–32)
CREATININE: 0.84 mg/dL (ref 0.40–1.20)
Chloride: 103 mEq/L (ref 96–112)
GFR: 94.09 mL/min (ref >60.00)
Glucose, Bld: 104 mg/dL — ABNORMAL HIGH (ref 70–99)
Potassium: 4.2 mEq/L (ref 3.5–5.1)
Sodium: 138 mEq/L (ref 135–145)
Total Bilirubin: 0.6 mg/dL (ref 0.2–1.2)
Total Protein: 6.9 g/dL (ref 6.0–8.3)

## 2017-03-12 LAB — VITAMIN D 25 HYDROXY (VIT D DEFICIENCY, FRACTURES): VITD: 14.13 ng/mL — AB (ref 30.00–100.00)

## 2017-03-12 LAB — TSH: TSH: 1.69 u[IU]/mL (ref 0.35–4.50)

## 2017-03-12 LAB — POCT URINE PREGNANCY: Preg Test, Ur: NEGATIVE

## 2017-03-12 LAB — VITAMIN B12: VITAMIN B 12: 190 pg/mL — AB (ref 211–911)

## 2017-03-12 MED ORDER — BENZONATATE 100 MG PO CAPS: 100.0000 mg | ORAL_CAPSULE | Freq: Two times a day (BID) | ORAL | 0 refills | Status: DC | PRN

## 2017-03-12 MED ORDER — ZOLPIDEM TARTRATE 5 MG PO TABS: 5.0000 mg | ORAL_TABLET | Freq: Every evening | ORAL | 1 refills | Status: DC | PRN

## 2017-03-12 MED ORDER — DOXYCYCLINE HYCLATE 100 MG PO CAPS: 100.0000 mg | ORAL_CAPSULE | Freq: Two times a day (BID) | ORAL | 0 refills | Status: DC

## 2017-03-12 MED ORDER — TRAZODONE HCL 50 MG PO TABS: 25.0000 mg | ORAL_TABLET | Freq: Every evening | ORAL | 1 refills | Status: DC | PRN

## 2017-03-12 NOTE — Progress Notes (Signed)
Pre visit review using our clinic review tool, if applicable. No additional management support is needed unless otherwise documented below in the visit note. 

## 2017-03-12 NOTE — Progress Notes (Signed)
Patient presents to clinic today c/o 3 weeks of chest congestion with cough that is mainly dry but sometimes productive of clear sputum at night. Has noted some mild fatigue below baseline. Denies chest pain or shortness of breath. Denies fever, chills. Denies sinus pressure/sinus pain. Denies recent travel or sick contact.   Patient also still struggling with migraine headaches. Has had daily headache over the past week. Is prescribed metoprolol 25 mg BID for prevention. Only takes as needed instead of as directed. Last dose this morning. Is not taking her Imitrex as directed.  Patient also noting irregular periods. LMP at the end of April. No menstrual period since. Denies concern of pregnancy but is sexually active.   Patient also notes continued fatigue. Patient recently diagnosed with both Vitamin D and b12 deficiencies. Was started on treatment. States she has not taken medications consistently as directed. As such, has not noted improvement in symptoms.   Past Medical History:  Diagnosis Date  . Breast discharge    bil unknown  time   . GERD (gastroesophageal reflux disease)   . HA (headache)   . History of chicken pox   . Migraine     Current Outpatient Prescriptions on File Prior to Visit  Medication Sig Dispense Refill  . metoprolol tartrate (LOPRESSOR) 25 MG tablet Take 1 tablet (25 mg total) by mouth 2 (two) times daily. 180 tablet 3  . pantoprazole (PROTONIX) 40 MG tablet Take 1 tablet (40 mg total) by mouth daily. 30 tablet 3  . vitamin B-12 (CYANOCOBALAMIN) 1000 MCG tablet Take 1 tablet (1,000 mcg total) by mouth daily. 30 tablet 0  . Vitamin D, Ergocalciferol, (DRISDOL) 50000 units CAPS capsule Take 1 capsule (50,000 Units total) by mouth every 7 (seven) days. 8 capsule 0  . ranitidine (ZANTAC) 300 MG tablet Take 1 tablet (300 mg total) by mouth at bedtime. (Patient not taking: Reported on 03/12/2017) 30 tablet 0   No current facility-administered medications on file prior  to visit.     Allergies  Allergen Reactions  . Vicodin [Hydrocodone-Acetaminophen] Nausea And Vomiting    Family History  Problem Relation Age of Onset  . Congestive Heart Failure Mother 69       Deceased  . Stroke Mother   . Diabetes Mother   . Stroke Brother        multiple   . Hypertension Brother        x3  . Hypertension Sister        x4  . Migraines Sister   . Diabetes Sister   . Diabetes Brother   . Diabetes Maternal Grandmother   . Breast cancer Maternal Aunt   . Heart attack Maternal Uncle   . Healthy Son        x1  . Healthy Daughter        x2    Social History   Social History  . Marital status: Single    Spouse name: N/A  . Number of children: N/A  . Years of education: 21   Social History Main Topics  . Smoking status: Never Smoker  . Smokeless tobacco: Never Used  . Alcohol use 0.0 oz/week     Comment: rare   . Drug use: No  . Sexual activity: No   Other Topics Concern  . None   Social History Narrative   Lives w/ sister   Caffeine use:  Tea and soda- 2-3 per day    Review of Systems - See HPI.  All other ROS are negative.  BP 118/70   Pulse 76   Temp 98 F (36.7 C) (Oral)   Resp 14   Ht 5\' 7"  (1.702 m)   Wt 231 lb (104.8 kg)   LMP 01/09/2017 (Approximate)   SpO2 98%   BMI 36.18 kg/m   Physical Exam  Constitutional: She is oriented to person, place, and time and well-developed, well-nourished, and in no distress.  HENT:  Head: Normocephalic and atraumatic.  Right Ear: External ear normal.  Left Ear: External ear normal.  Nose: Nose normal.  Mouth/Throat: Oropharynx is clear and moist. No oropharyngeal exudate.  TM within normal limits bilaterally  Eyes: Conjunctivae are normal.  Neck: Neck supple.  Cardiovascular: Normal rate, regular rhythm, normal heart sounds and intact distal pulses.   Pulmonary/Chest: Effort normal and breath sounds normal. No respiratory distress. She has no wheezes. She has no rales. She exhibits  no tenderness.  Lymphadenopathy:    She has no cervical adenopathy.  Neurological: She is alert and oriented to person, place, and time.  Skin: Skin is warm and dry. No rash noted.  Psychiatric: Affect normal.  Vitals reviewed.  Assessment/Plan: 1. Chronic fatigue In patient with B12 and Vitamin D deficiency. Is non-adherent with treatment for these issues. Likely culprit of current symptoms but will obtain full lab panel. Supportive measures reviewed. Patient encouraged to resume medications as directed. - CBC with Differential/Platelet - Comprehensive metabolic panel - [id number] - Vitamin D (25 hydroxy) - TSH  2. Irregular periods Likely perimenopausal as sister and mother with menopause in mid 3840s. Urine pregnancy negative. Giving concerns will check serum pregnancy and TSH level. Follow-up with GYN. - TSH - hCG, serum, qualitative - POCT urine pregnancy  3. Acute bacterial bronchitis - doxycycline (VIBRAMYCIN) 100 MG capsule; Take 1 capsule (100 mg total) by mouth 2 (two) times daily.  Dispense: 14 capsule; Refill: 0 - benzonatate (TESSALON) 100 MG capsule; Take 1 capsule (100 mg total) by mouth 2 (two) times daily as needed for cough.  Dispense: 20 capsule; Refill: 0  4. Insomnia, unspecified type Supportive measures and sleep hygiene discussed. Will start short-term trial of Ambien to help her get some rest as she is overdue for good night's sleep. Discussed if longer-term medication needed, we will have to discuss other options.  Follow-up scheduled.    Piedad ClimesMartin, Keniesha Adderly Cody, PA-C

## 2017-03-12 NOTE — Patient Instructions (Addendum)
Please go to the lab for blood work. I am checking a blood pregnancy test giving history. Your urine test is negative.  You are likely in the perimenopausal phase but we are assessing further.  Make sure to take the metoprolol twice daily as directed for migraine prevention. Take the Trazodone as directed each evening to help with sleep.   Take antibiotic (Doxycycline) as directed.  Increase fluids.  Get plenty of rest. Use Mucinex for congestion. Use Tessalon as directed for cough. Take a daily probiotic (I recommend Align or Culturelle, but even Activia Yogurt may be beneficial).  A humidifier placed in the bedroom may offer some relief for a dry, scratchy throat of nasal irritation.  Read information below on acute bronchitis. Please call or return to clinic if symptoms are not improving.  Acute Bronchitis Bronchitis is when the airways that extend from the windpipe into the lungs get red, puffy, and painful (inflamed). Bronchitis often causes thick spit (mucus) to develop. This leads to a cough. A cough is the most common symptom of bronchitis. In acute bronchitis, the condition usually begins suddenly and goes away over time (usually in 2 weeks). Smoking, allergies, and asthma can make bronchitis worse. Repeated episodes of bronchitis may cause more lung problems.  HOME CARE  Rest.  Drink enough fluids to keep your pee (urine) clear or pale yellow (unless you need to limit fluids as told by your doctor).  Only take over-the-counter or prescription medicines as told by your doctor.  Avoid smoking and secondhand smoke. These can make bronchitis worse. If you are a smoker, think about using nicotine gum or skin patches. Quitting smoking will help your lungs heal faster.  Reduce the chance of getting bronchitis again by:  Washing your hands often.  Avoiding people with cold symptoms.  Trying not to touch your hands to your mouth, nose, or eyes.  Follow up with your doctor as  told.  GET HELP IF: Your symptoms do not improve after 1 week of treatment. Symptoms include:  Cough.  Fever.  Coughing up thick spit.  Body aches.  Chest congestion.  Chills.  Shortness of breath.  Sore throat.  GET HELP RIGHT AWAY IF:   You have an increased fever.  You have chills.  You have severe shortness of breath.  You have bloody thick spit (sputum).  You throw up (vomit) often.  You lose too much body fluid (dehydration).  You have a severe headache.  You faint.  MAKE SURE YOU:   Understand these instructions.  Will watch your condition.  Will get help right away if you are not doing well or get worse. Document Released: 03/05/2008 Document Revised: 05/20/2013 Document Reviewed: 03/10/2013 [facility] Patient Information 2015 [employer] [place]. This information is not intended to replace advice given to you by your health care provider. Make sure you discuss any questions you have with your health care provider.

## 2017-03-13 ENCOUNTER — Telehealth: Payer: Self-pay | Admitting: Physician Assistant

## 2017-03-13 LAB — HCG, SERUM, QUALITATIVE: Preg, Serum: NEGATIVE

## 2017-03-13 NOTE — Telephone Encounter (Signed)
Pt called back and said that forms for Short Term Disability is not needed. Her employer sates that she was not out of work long enough.

## 2017-03-13 NOTE — Telephone Encounter (Signed)
[facility]  Charge sheet generated and forms placed in MD/nurse bin in front office.

## 2017-03-13 NOTE — Telephone Encounter (Signed)
Noted. Will shred forms. Thank you.

## 2017-03-13 NOTE — Telephone Encounter (Signed)
Forms in your box for provider to review.

## 2017-03-19 ENCOUNTER — Encounter: Payer: Self-pay | Admitting: Emergency Medicine

## 2017-03-19 ENCOUNTER — Ambulatory Visit (INDEPENDENT_AMBULATORY_CARE_PROVIDER_SITE_OTHER): Payer: BLUE CROSS/BLUE SHIELD | Admitting: Physician Assistant

## 2017-03-19 ENCOUNTER — Ambulatory Visit (INDEPENDENT_AMBULATORY_CARE_PROVIDER_SITE_OTHER): Payer: BLUE CROSS/BLUE SHIELD

## 2017-03-19 ENCOUNTER — Telehealth: Payer: Self-pay | Admitting: Physician Assistant

## 2017-03-19 ENCOUNTER — Encounter: Payer: Self-pay | Admitting: Physician Assistant

## 2017-03-19 VITALS — BP 122/78 | HR 78 | Temp 97.6°F | Resp 16 | Ht 67.0 in | Wt 231.0 lb

## 2017-03-19 DIAGNOSIS — B9689 Other specified bacterial agents as the cause of diseases classified elsewhere: Secondary | ICD-10-CM | POA: Diagnosis not present

## 2017-03-19 DIAGNOSIS — J189 Pneumonia, unspecified organism: Secondary | ICD-10-CM | POA: Diagnosis not present

## 2017-03-19 DIAGNOSIS — R5383 Other fatigue: Secondary | ICD-10-CM | POA: Diagnosis not present

## 2017-03-19 DIAGNOSIS — J208 Acute bronchitis due to other specified organisms: Secondary | ICD-10-CM

## 2017-03-19 DIAGNOSIS — J9811 Atelectasis: Secondary | ICD-10-CM | POA: Diagnosis not present

## 2017-03-19 LAB — CBC WITH DIFFERENTIAL/PLATELET
BASOS ABS: 0.1 10*3/uL (ref 0.0–0.1)
Basophils Relative: 0.6 % (ref 0.0–3.0)
EOS PCT: 0.7 % (ref 0.0–5.0)
Eosinophils Absolute: 0.1 10*3/uL (ref 0.0–0.7)
HEMATOCRIT: 40.6 % (ref 36.0–46.0)
Hemoglobin: 13.4 g/dL (ref 12.0–15.0)
LYMPHS ABS: 3.7 10*3/uL (ref 0.7–4.0)
LYMPHS PCT: 25.5 % (ref 12.0–46.0)
MCHC: 33 g/dL (ref 30.0–36.0)
MCV: 95.9 fl (ref 78.0–100.0)
MONOS PCT: 7 % (ref 3.0–12.0)
Monocytes Absolute: 1 10*3/uL (ref 0.1–1.0)
NEUTROS ABS: 9.5 10*3/uL — AB (ref 1.4–7.7)
Neutrophils Relative %: 66.2 % (ref 43.0–77.0)
PLATELETS: 309 10*3/uL (ref 150.0–400.0)
RBC: 4.24 Mil/uL (ref 3.87–5.11)
RDW: 14.4 % (ref 11.5–15.5)
WBC: 14.4 10*3/uL — ABNORMAL HIGH (ref 4.0–10.5)

## 2017-03-19 MED ORDER — PROMETHAZINE-DM 6.25-15 MG/5ML PO SYRP: 5.0000 mL | ORAL_SOLUTION | Freq: Four times a day (QID) | ORAL | 0 refills | Status: DC | PRN

## 2017-03-19 MED ORDER — ALBUTEROL SULFATE (2.5 MG/3ML) 0.083% IN NEBU
2.5000 mg | INHALATION_SOLUTION | Freq: Once | RESPIRATORY_TRACT | Status: AC
  Administered 2017-03-19: 2.5 mg via RESPIRATORY_TRACT

## 2017-03-19 MED ORDER — LEVOFLOXACIN 500 MG PO TABS: 500.0000 mg | ORAL_TABLET | Freq: Every day | ORAL | 0 refills | Status: DC

## 2017-03-19 NOTE — Assessment & Plan Note (Signed)
Multifactorial. Repeat CBC. Patient has started her vitamin D and b12 as directed. Concern for pneumonia. X-ray today. Follow-up discussed.

## 2017-03-19 NOTE — Progress Notes (Signed)
Pre visit review using our clinic review tool, if applicable. No additional management support is needed unless otherwise documented below in the visit note. 

## 2017-03-19 NOTE — Telephone Encounter (Signed)
Patient states she is returning phone call to Patina.

## 2017-03-19 NOTE — Patient Instructions (Signed)
Please continue B12 and Vitamin D as directed. Stay hydrated. Get plenty of rest.  Continue antibiotic. We are stopping the Tessalon Perles and Ambien. Start the Promethazine-DM cough syrup as directed.  I am reaching out to he provider that reviewed your sleep study. I still have significant concerns for sleep apnea.  Make sure to follow-up with GYN.

## 2017-03-19 NOTE — Progress Notes (Signed)
Patient presents to clinic today for follow-up. At last visit patient was diagnosed with bronchitis and started on Doxycycline and Tessalon. She endorses taking medications as directed. Notes no recurrence of fever. Is still coughing but the cough is mainly dry and worse at night. Tessalon is not helping with the cough. Notes wheezing at night.   Lab workup was obtained for chronic fatigue revealing continued decreased levels of B12 and Vitamin D for which patient was being treated. On discussion of lab results, patient states that she was not actually taking these medications as directed. Has restarted since then and tolerating well. Mild leukocytosis noted on labs thought to be multifactorial giving current infection being treated. Patient also with history of sleep apnea assessment.   Past Medical History:  Diagnosis Date  . Breast discharge    bil unknown  time   . GERD (gastroesophageal reflux disease)   . HA (headache)   . History of chicken pox   . Migraine     Current Outpatient Prescriptions on File Prior to Visit  Medication Sig Dispense Refill  . doxycycline (VIBRAMYCIN) 100 MG capsule Take 1 capsule (100 mg total) by mouth 2 (two) times daily. 14 capsule 0  . metoprolol tartrate (LOPRESSOR) 25 MG tablet Take 1 tablet (25 mg total) by mouth 2 (two) times daily. 180 tablet 3  . pantoprazole (PROTONIX) 40 MG tablet Take 1 tablet (40 mg total) by mouth daily. 30 tablet 3  . Vitamin D, Ergocalciferol, (DRISDOL) 50000 units CAPS capsule Take 1 capsule (50,000 Units total) by mouth every 7 (seven) days. 8 capsule 0  . zolpidem (AMBIEN) 5 MG tablet Take 1 tablet (5 mg total) by mouth at bedtime as needed for sleep. 15 tablet 1  . benzonatate (TESSALON) 100 MG capsule Take 1 capsule (100 mg total) by mouth 2 (two) times daily as needed for cough. (Patient not taking: Reported on 03/19/2017) 20 capsule 0   No current facility-administered medications on file prior to visit.      Allergies  Allergen Reactions  . Vicodin [Hydrocodone-Acetaminophen] Nausea And Vomiting    Family History  Problem Relation Age of Onset  . Congestive Heart Failure Mother 75       Deceased  . Stroke Mother   . Diabetes Mother   . Stroke Brother        multiple   . Hypertension Brother        x3  . Hypertension Sister        x4  . Migraines Sister   . Diabetes Sister   . Diabetes Brother   . Diabetes Maternal Grandmother   . Breast cancer Maternal Aunt   . Heart attack Maternal Uncle   . Healthy Son        x1  . Healthy Daughter        x2    Social History   Social History  . Marital status: Single    Spouse name: N/A  . Number of children: N/A  . Years of education: 16   Social History Main Topics  . Smoking status: Never Smoker  . Smokeless tobacco: Never Used  . Alcohol use 0.0 oz/week     Comment: rare   . Drug use: No  . Sexual activity: No   Other Topics Concern  . None   Social History Narrative   Lives w/ sister   Caffeine use:  Tea and soda- 2-3 per day   Review of Systems - See HPI.  All other ROS are negative.  BP 122/78   Pulse 78   Temp 97.6 F (36.4 C) (Oral)   Resp 16   Ht 5\' 7"  (1.702 m)   Wt 231 lb (104.8 kg)   SpO2 98%   BMI 36.18 kg/m   Physical Exam  Constitutional: She is oriented to person, place, and time and well-developed, well-nourished, and in no distress.  HENT:  Head: Normocephalic and atraumatic.  Right Ear: External ear normal.  Left Ear: External ear normal.  Nose: Nose normal.  Mouth/Throat: Oropharynx is clear and moist.  TM within normal limits bilaterally.  Eyes: Conjunctivae are normal.  Neck: Neck supple.  Cardiovascular: Normal rate, regular rhythm, normal heart sounds and intact distal pulses.   Pulmonary/Chest: Effort normal. No respiratory distress. She has wheezes. She has no rales. She exhibits no tenderness.  Lymphadenopathy:    She has no cervical adenopathy.  Neurological: She is  alert and oriented to person, place, and time.  Skin: Skin is warm and dry. No rash noted.  Psychiatric: Affect normal.  Vitals reviewed.   Recent Results (from the past 2160 hour(s))  CBC with Differential/Platelet     Status: Abnormal   Collection Time: 03/12/17  1:52 PM  Result Value Ref Range   WBC 13.0 (H) 4.0 - 10.5 K/uL   RBC 4.01 3.87 - 5.11 Mil/uL   Hemoglobin 12.6 12.0 - 15.0 g/dL   HCT 16.138.2 09.636.0 - 04.546.0 %   MCV 95.3 78.0 - 100.0 fl   MCHC 32.9 30.0 - 36.0 g/dL   RDW 40.914.5 81.111.5 - 91.415.5 %   Platelets 302.0 150.0 - 400.0 K/uL   Neutrophils Relative % 73.3 43.0 - 77.0 %   Lymphocytes Relative 17.9 12.0 - 46.0 %   Monocytes Relative 7.6 3.0 - 12.0 %   Eosinophils Relative 0.7 0.0 - 5.0 %   Basophils Relative 0.5 0.0 - 3.0 %   Neutro Abs 9.5 (H) 1.4 - 7.7 K/uL   Lymphs Abs 2.3 0.7 - 4.0 K/uL   Monocytes Absolute 1.0 0.1 - 1.0 K/uL   Eosinophils Absolute 0.1 0.0 - 0.7 K/uL   Basophils Absolute 0.1 0.0 - 0.1 K/uL  Comprehensive metabolic panel     Status: Abnormal   Collection Time: 03/12/17  1:52 PM  Result Value Ref Range   Sodium 138 135 - 145 mEq/L   Potassium 4.2 3.5 - 5.1 mEq/L   Chloride 103 96 - 112 mEq/L   CO2 29 19 - 32 mEq/L   Glucose, Bld 104 (H) 70 - 99 mg/dL   BUN 11 6 - 23 mg/dL   Creatinine, Ser 7.820.84 0.40 - 1.20 mg/dL   Total Bilirubin 0.6 0.2 - 1.2 mg/dL   Alkaline Phosphatase 73 39 - 117 U/L   AST 16 0 - 37 U/L   ALT 13 0 - 35 U/L   Total Protein 6.9 6.0 - 8.3 g/dL   Albumin 4.1 3.5 - 5.2 g/dL   Calcium 9.5 8.4 - 95.610.5 mg/dL   GFR 21.3094.09 >86.57>60.00 mL/min  B12     Status: Abnormal   Collection Time: 03/12/17  1:52 PM  Result Value Ref Range   Vitamin B-12 190 (L) 211 - 911 pg/mL  Vitamin D (25 hydroxy)     Status: Abnormal   Collection Time: 03/12/17  1:52 PM  Result Value Ref Range   VITD 14.13 (L) 30.00 - 100.00 ng/mL  TSH     Status: None   Collection Time: 03/12/17  1:52  PM  Result Value Ref Range   TSH 1.69 0.35 - 4.50 uIU/mL  hCG, serum,  qualitative     Status: None   Collection Time: 03/12/17  1:52 PM  Result Value Ref Range   Preg, Serum NEGATIVE     Comment:   Reference Range Non-pregnant: Negative Pregnant:     Positive   POCT urine pregnancy     Status: Normal   Collection Time: 03/12/17  2:12 PM  Result Value Ref Range   Preg Test, Ur Negative Negative   Assessment/Plan: Fatigue Multifactorial. Repeat CBC. Patient has started her vitamin D and b12 as directed. Concern for pneumonia. X-ray today. Follow-up discussed.  Community acquired pneumonia of right lung X-ray confirms concern for a mild R lobe pneumonia. Stop doxycycline and begin Levaquin. Continue supportive measures. Follow-up discussed.    Piedad Climes, PA-C

## 2017-03-19 NOTE — Assessment & Plan Note (Signed)
X-ray confirms concern for a mild R lobe pneumonia. Stop doxycycline and begin Levaquin. Continue supportive measures. Follow-up discussed.

## 2017-03-19 NOTE — Telephone Encounter (Signed)
Patient calling back, states she missed Patina's call.  Please call her back asap.

## 2017-03-20 ENCOUNTER — Telehealth: Payer: Self-pay | Admitting: Physician Assistant

## 2017-03-20 ENCOUNTER — Telehealth: Payer: Self-pay | Admitting: *Deleted

## 2017-03-20 ENCOUNTER — Encounter: Payer: Self-pay | Admitting: Emergency Medicine

## 2017-03-20 MED ORDER — AZITHROMYCIN 250 MG PO TABS: ORAL_TABLET | ORAL | 0 refills | Status: DC

## 2017-03-20 NOTE — Telephone Encounter (Signed)
I have revised the work note with a return date of the 03/22/17. Faxed to the work number. Patient is aware

## 2017-03-20 NOTE — Telephone Encounter (Signed)
Stop Levaquin ASAP. Add to allergies list.  Sent in Z-pack for her to take instead.

## 2017-03-20 NOTE — Telephone Encounter (Signed)
Patient states she took the Levaquin yesterday. She started having abdominal cramps, diarrhea, throat itching and itching all over body. Advised not to take anymore of the Levaquin. Please advise.

## 2017-03-20 NOTE — Telephone Encounter (Signed)
Advised patient to stop the Levaquin and added to her allergy list. Start Z-pak and medication sent to the pharmacy. Advised patient she will need to request short term disability paperwork [facility].

## 2017-03-20 NOTE — Telephone Encounter (Signed)
Pt would like a call back regarding her work note. Pt states that she thought that she was to return on Friday 6/22 and her note states to return on Thursday 6/21. Please advise

## 2017-03-20 NOTE — Telephone Encounter (Signed)
See other note

## 2017-03-20 NOTE — Telephone Encounter (Signed)
Patient states that [facility] prescribed her patient an antibiotic and she states she is experiences stomach cramps and diarrhea. Patient is wondering if she needs to stop the medication. Patient is requesting a call back .Her number is [phone]. Please advise. Thank you

## 2017-03-22 ENCOUNTER — Telehealth: Payer: Self-pay | Admitting: Physician Assistant

## 2017-03-22 NOTE — Telephone Encounter (Signed)
Received forms by fax for disability/leave, [facility] bin [facility] with [facility] attached

## 2017-03-22 NOTE — Telephone Encounter (Signed)
Forms received. Will complete this weekend and fax out Monday morning. Will make notation when this is completed.

## 2017-03-25 NOTE — Telephone Encounter (Signed)
Saundra ShellingNicole Deshong Self  Joni Reiningicole called to make sure we had received her papers, I let her know we have and [facility] is working on them.

## 2017-03-26 ENCOUNTER — Ambulatory Visit: Payer: BLUE CROSS/BLUE SHIELD | Admitting: Physician Assistant

## 2017-03-26 NOTE — Telephone Encounter (Signed)
Papers completed. Faxed in this morning. Please inform patient.

## 2017-03-26 NOTE — Telephone Encounter (Signed)
LM to inform pt that forms had been faxed.

## 2017-04-09 ENCOUNTER — Telehealth: Payer: Self-pay | Admitting: *Deleted

## 2017-04-09 NOTE — Telephone Encounter (Signed)
Left message for patient to call to discuss.  

## 2017-04-09 NOTE — Telephone Encounter (Signed)
Patient is aware of notes below.  She states that she will have her BP checked again and go to the ER if needed.  She stated that she will call in the morning to [facility]

## 2017-04-09 NOTE — Telephone Encounter (Signed)
Patient sees Neurology but has not followed up as directed multiple times. Headaches are mutlifactorial -- BP and chronic migraines. She needs to be consistent with her medications every day in order for them to work. She has previously also been non-adherent with b12 supplementation for her deficiency that can contribute to neurological symptoms. I want her to have her BP rechecked this afternoon/evening after taking additional medication -- if still significantly elevated with headache, she needs to go to the ER. Otherwise she needs to be seen tomorrow in office. There is little I can do if she will not follow instructions, take medications as directed and follow-up with specialists as directed.

## 2017-04-09 NOTE — Telephone Encounter (Signed)
Patient calling in stating that she is still having issues with headaches.  She stated that her blood pressure was up - and she "had to take a blood pressure pill"   I asked if she was taking her medication consistently and she said no.   I asked if PCP had made changes and she said no but some days she might take one and some days she might take 2. I advised patient that she should be taking her medication as prescribed and she stated that even when she did it did not help her headaches.   Patient reports that work sent her home today for high BP (160/100) and headaches. She has taken a BP pill since going home.   She states that she still has "very bad debilitating headaches" and she doesn't know why.  She requested that message be sent to PCP -     Patient also questioning if we received FMLA paperwork today that was being sent over.  I have not seen any on the fax machine - patient states that we should be receiving it soon.

## 2017-04-10 ENCOUNTER — Telehealth: Payer: Self-pay | Admitting: Physician Assistant

## 2017-04-10 NOTE — Telephone Encounter (Signed)
F[facility] forms [facility].  Charge sheet generated and placed with form.  Form put in basket in front office.

## 2017-04-11 DIAGNOSIS — Z5321 Procedure and treatment not carried out due to patient leaving prior to being seen by health care provider: Secondary | ICD-10-CM | POA: Diagnosis not present

## 2017-04-11 DIAGNOSIS — R202 Paresthesia of skin: Secondary | ICD-10-CM | POA: Diagnosis not present

## 2017-04-11 DIAGNOSIS — I10 Essential (primary) hypertension: Secondary | ICD-10-CM | POA: Diagnosis not present

## 2017-04-11 DIAGNOSIS — R51 Headache: Secondary | ICD-10-CM | POA: Diagnosis not present

## 2017-04-11 NOTE — Telephone Encounter (Signed)
Left message on machine advising patient that FMLA paperwork is for old issues or new issues and she would need to schedule an appointment to be filled out.

## 2017-04-11 NOTE — Telephone Encounter (Signed)
Spoke with patient and advised due to elevated blood pressures she needs to be seen. She has not seen Neurologist. [facility] the [facility] every 6 months.

## 2017-04-12 ENCOUNTER — Ambulatory Visit (INDEPENDENT_AMBULATORY_CARE_PROVIDER_SITE_OTHER): Payer: BLUE CROSS/BLUE SHIELD | Admitting: Physician Assistant

## 2017-04-12 ENCOUNTER — Encounter: Payer: Self-pay | Admitting: Physician Assistant

## 2017-04-12 ENCOUNTER — Telehealth: Payer: Self-pay | Admitting: Physician Assistant

## 2017-04-12 VITALS — BP 140/98 | HR 59 | Temp 98.1°F | Resp 14 | Ht 67.0 in | Wt 231.0 lb

## 2017-04-12 DIAGNOSIS — I10 Essential (primary) hypertension: Secondary | ICD-10-CM

## 2017-04-12 DIAGNOSIS — E538 Deficiency of other specified B group vitamins: Secondary | ICD-10-CM

## 2017-04-12 DIAGNOSIS — E559 Vitamin D deficiency, unspecified: Secondary | ICD-10-CM

## 2017-04-12 DIAGNOSIS — G43709 Chronic migraine without aura, not intractable, without status migrainosus: Secondary | ICD-10-CM

## 2017-04-12 DIAGNOSIS — IMO0002 Reserved for concepts with insufficient information to code with codable children: Secondary | ICD-10-CM

## 2017-04-12 LAB — CBC
HEMATOCRIT: 38.7 % (ref 36.0–46.0)
HEMOGLOBIN: 12.9 g/dL (ref 12.0–15.0)
MCHC: 33.3 g/dL (ref 30.0–36.0)
MCV: 96 fl (ref 78.0–100.0)
PLATELETS: 301 10*3/uL (ref 150.0–400.0)
RBC: 4.03 Mil/uL (ref 3.87–5.11)
RDW: 14 % (ref 11.5–15.5)
WBC: 10.7 10*3/uL — ABNORMAL HIGH (ref 4.0–10.5)

## 2017-04-12 LAB — VITAMIN B12: VITAMIN B 12: 165 pg/mL — AB (ref 211–911)

## 2017-04-12 LAB — VITAMIN D 25 HYDROXY (VIT D DEFICIENCY, FRACTURES): VITD: 15.74 ng/mL — ABNORMAL LOW (ref 30.00–100.00)

## 2017-04-12 MED ORDER — PROPRANOLOL HCL 40 MG PO TABS: 40.0000 mg | ORAL_TABLET | Freq: Two times a day (BID) | ORAL | 1 refills | Status: DC

## 2017-04-12 MED ORDER — HYDROCHLOROTHIAZIDE 25 MG PO TABS
12.5000 mg | ORAL_TABLET | Freq: Every day | ORAL | 1 refills | Status: DC
Start: 2017-04-12 — End: 2017-10-25

## 2017-04-12 NOTE — Patient Instructions (Addendum)
Please go to the lab for blood work. I will call with results.  We are making a few changes: - Stop the Metoprolol.  - Start the propranolol as directed twice daily. - Start the hydrochlorothiazide as directed once daily.   Limit salt intake but stay well-hydrated.  Check BP daily at least 1-hour after taking medication. Write this number down and call me with results.   Please stop by the front desk so that ladies can help you schedule an appointment with your Neurologist. This is very important.   Excedrin migraine for acute headache.  Follow-up with me in weeks.

## 2017-04-12 NOTE — Progress Notes (Signed)
Pre visit review using our clinic review tool, if applicable. No additional management support is needed unless otherwise documented below in the visit note. 

## 2017-04-12 NOTE — Progress Notes (Signed)
Patient presents to clinic today for follow-up of elevated BP and chronic migraine headaches. Patient with history of chronic migraine headaches, followed by Neurology but has not followed up as directed. Patient is currently on a regimen of Metoprolol 25 mg BID for both BP control and migraine prevention. Notes continued headache despite medications. Has only been taking her Metoprolol as directed over the past week. States before then she was only taking when she felt she needed the medication.  Patient also notes significantly elevated BP recently associated with headaches. Patient denies chest pain, palpitations, lightheadedness, dizziness, vision changes. Denies change to sleep or stress levels. Denies symptoms at present.   BP Readings from Last 3 Encounters:  04/12/17 (!) 140/98  03/19/17 122/78  03/12/17 118/70   Past Medical History:  Diagnosis Date  . Breast discharge    bil unknown  time   . GERD (gastroesophageal reflux disease)   . HA (headache)   . History of chicken pox   . Migraine     Current Outpatient Prescriptions on File Prior to Visit  Medication Sig Dispense Refill  . pantoprazole (PROTONIX) 40 MG tablet Take 1 tablet (40 mg total) by mouth daily. 30 tablet 3  . vitamin B-12 (CYANOCOBALAMIN) 500 MCG tablet Take 500 mcg by mouth daily.    . Vitamin D, Ergocalciferol, (DRISDOL) 50000 units CAPS capsule Take 1 capsule (50,000 Units total) by mouth every 7 (seven) days. 8 capsule 0   No current facility-administered medications on file prior to visit.     Allergies  Allergen Reactions  . Levaquin [Levofloxacin In D5w] Diarrhea and Itching  . Vicodin [Hydrocodone-Acetaminophen] Nausea And Vomiting    Family History  Problem Relation Age of Onset  . Congestive Heart Failure Mother 4063       Deceased  . Stroke Mother   . Diabetes Mother   . Stroke Brother        multiple   . Hypertension Brother        x3  . Hypertension Sister        x4  . Migraines  Sister   . Diabetes Sister   . Diabetes Brother   . Diabetes Maternal Grandmother   . Breast cancer Maternal Aunt   . Heart attack Maternal Uncle   . Healthy Son        x1  . Healthy Daughter        x2    Social History   Social History  . Marital status: Single    Spouse name: N/A  . Number of children: N/A  . Years of education: 3112   Social History Main Topics  . Smoking status: Never Smoker  . Smokeless tobacco: Never Used  . Alcohol use 0.0 oz/week     Comment: rare   . Drug use: No  . Sexual activity: No   Other Topics Concern  . None   Social History Narrative   Lives w/ sister   Caffeine use:  Tea and soda- 2-3 per day   Review of Systems - See HPI.  All other ROS are negative.  BP (!) 140/98   Pulse (!) 59   Temp 98.1 F (36.7 C) (Oral)   Resp 14   Ht 5\' 7"  (1.702 m)   Wt 231 lb (104.8 kg)   SpO2 98%   BMI 36.18 kg/m   Physical Exam  Constitutional: She is oriented to person, place, and time and well-developed, well-nourished, and in no distress.  HENT:  Head: Normocephalic and atraumatic.  Eyes: Pupils are equal, round, and reactive to light. Conjunctivae are normal.  Neck: Neck supple.  Cardiovascular: Normal rate, regular rhythm, normal heart sounds and intact distal pulses.   Pulmonary/Chest: Effort normal and breath sounds normal. No respiratory distress. She has no wheezes. She has no rales. She exhibits no tenderness.  Lymphadenopathy:    She has no cervical adenopathy.  Neurological: She is alert and oriented to person, place, and time.  Skin: Skin is warm and dry. No rash noted.  Psychiatric: Affect normal.  Vitals reviewed.  Recent Results (from the past 2160 hour(s))  CBC with Differential/Platelet     Status: Abnormal   Collection Time: 03/12/17  1:52 PM  Result Value Ref Range   WBC 13.0 (H) 4.0 - 10.5 K/uL   RBC 4.01 3.87 - 5.11 Mil/uL   Hemoglobin 12.6 12.0 - 15.0 g/dL   HCT 11.9 14.7 - 82.9 %   MCV 95.3 78.0 - 100.0 fl    MCHC 32.9 30.0 - 36.0 g/dL   RDW 56.2 13.0 - 86.5 %   Platelets 302.0 150.0 - 400.0 K/uL   Neutrophils Relative % 73.3 43.0 - 77.0 %   Lymphocytes Relative 17.9 12.0 - 46.0 %   Monocytes Relative 7.6 3.0 - 12.0 %   Eosinophils Relative 0.7 0.0 - 5.0 %   Basophils Relative 0.5 0.0 - 3.0 %   Neutro Abs 9.5 (H) 1.4 - 7.7 K/uL   Lymphs Abs 2.3 0.7 - 4.0 K/uL   Monocytes Absolute 1.0 0.1 - 1.0 K/uL   Eosinophils Absolute 0.1 0.0 - 0.7 K/uL   Basophils Absolute 0.1 0.0 - 0.1 K/uL  Comprehensive metabolic panel     Status: Abnormal   Collection Time: 03/12/17  1:52 PM  Result Value Ref Range   Sodium 138 135 - 145 mEq/L   Potassium 4.2 3.5 - 5.1 mEq/L   Chloride 103 96 - 112 mEq/L   CO2 29 19 - 32 mEq/L   Glucose, Bld 104 (H) 70 - 99 mg/dL   BUN 11 6 - 23 mg/dL   Creatinine, Ser 7.84 0.40 - 1.20 mg/dL   Total Bilirubin 0.6 0.2 - 1.2 mg/dL   Alkaline Phosphatase 73 39 - 117 U/L   AST 16 0 - 37 U/L   ALT 13 0 - 35 U/L   Total Protein 6.9 6.0 - 8.3 g/dL   Albumin 4.1 3.5 - 5.2 g/dL   Calcium 9.5 8.4 - 69.6 mg/dL   GFR 29.52 >84.13 mL/min  B12     Status: Abnormal   Collection Time: 03/12/17  1:52 PM  Result Value Ref Range   Vitamin B-12 190 (L) 211 - 911 pg/mL  Vitamin D (25 hydroxy)     Status: Abnormal   Collection Time: 03/12/17  1:52 PM  Result Value Ref Range   VITD 14.13 (L) 30.00 - 100.00 ng/mL  TSH     Status: None   Collection Time: 03/12/17  1:52 PM  Result Value Ref Range   TSH 1.69 0.35 - 4.50 uIU/mL  hCG, serum, qualitative     Status: None   Collection Time: 03/12/17  1:52 PM  Result Value Ref Range   Preg, Serum NEGATIVE     Comment:   Reference Range Non-pregnant: Negative Pregnant:     Positive   POCT urine pregnancy     Status: Normal   Collection Time: 03/12/17  2:12 PM  Result Value Ref Range   Preg Test, Ur  Negative Negative  CBC w/Diff     Status: Abnormal   Collection Time: 03/19/17  9:52 AM  Result Value Ref Range   WBC 14.4 (H) 4.0 - 10.5  K/uL   RBC 4.24 3.87 - 5.11 Mil/uL   Hemoglobin 13.4 12.0 - 15.0 g/dL   HCT 16.1 09.6 - 04.5 %   MCV 95.9 78.0 - 100.0 fl   MCHC 33.0 30.0 - 36.0 g/dL   RDW 40.9 81.1 - 91.4 %   Platelets 309.0 150.0 - 400.0 K/uL   Neutrophils Relative % 66.2 43.0 - 77.0 %   Lymphocytes Relative 25.5 12.0 - 46.0 %   Monocytes Relative 7.0 3.0 - 12.0 %   Eosinophils Relative 0.7 0.0 - 5.0 %   Basophils Relative 0.6 0.0 - 3.0 %   Neutro Abs 9.5 (H) 1.4 - 7.7 K/uL   Lymphs Abs 3.7 0.7 - 4.0 K/uL   Monocytes Absolute 1.0 0.1 - 1.0 K/uL   Eosinophils Absolute 0.1 0.0 - 0.7 K/uL   Basophils Absolute 0.1 0.0 - 0.1 K/uL  B12     Status: Abnormal   Collection Time: 04/12/17 12:22 PM  Result Value Ref Range   Vitamin B-12 165 (L) 211 - 911 pg/mL  CBC     Status: Abnormal   Collection Time: 04/12/17 12:22 PM  Result Value Ref Range   WBC 10.7 (H) 4.0 - 10.5 K/uL   RBC 4.03 3.87 - 5.11 Mil/uL   Platelets 301.0 150.0 - 400.0 K/uL   Hemoglobin 12.9 12.0 - 15.0 g/dL   HCT 78.2 95.6 - 21.3 %   MCV 96.0 78.0 - 100.0 fl   MCHC 33.3 30.0 - 36.0 g/dL   RDW 08.6 57.8 - 46.9 %  Vitamin D (25 hydroxy)     Status: Abnormal   Collection Time: 04/12/17 12:22 PM  Result Value Ref Range   VITD 15.74 (L) 30.00 - 100.00 ng/mL   Assessment/Plan: Vitamin D deficiency Endorses taking her Vitamin D as directed despite not being prescribed since March 2018.  Repeat labs today.   Hypertension BP above goal but heart rate too low to increase Metoprolol. Stop Metoprolol. Will start Propranolol for migraine and BP as well as HCTZ for BP. DASH diet discussed. Close follow-up scheduled.   Chronic migraine Patient non-adherent with medications and follow-up with specialist. Will stop Metoprolol and start trial of Propranolol. Follow-up scheduled with Neurology. Close follow-up scheduled here in office.   B12 deficiency Repeat levels today. If not improved, will start injections.     Piedad Climes, PA-C

## 2017-04-12 NOTE — Telephone Encounter (Signed)
Please inform patient that FMLA paperwork is complete and has been faxed in.

## 2017-04-15 ENCOUNTER — Telehealth: Payer: Self-pay | Admitting: Emergency Medicine

## 2017-04-15 ENCOUNTER — Encounter: Payer: Self-pay | Admitting: Emergency Medicine

## 2017-04-15 DIAGNOSIS — R519 Headache, unspecified: Secondary | ICD-10-CM

## 2017-04-15 DIAGNOSIS — R51 Headache: Principal | ICD-10-CM

## 2017-04-15 NOTE — Telephone Encounter (Signed)
Patient called today stating after starting the propranolol and HCTZ. She had a headache all day on Saturday. She has had lightheaded and headache.  She also wanted a note to return to work. She was out of work today as well. Please advise.

## 2017-04-15 NOTE — Telephone Encounter (Signed)
Hold propranolol. Continue HCTZ. Check BP daily and record. Follow up as scheduled this week. We will make further adjustments. Make sure our office has helped her schedule follow up with neurology for patient.

## 2017-04-15 NOTE — Telephone Encounter (Signed)
Return back to work was faxed to provided number [phone] by patient

## 2017-04-15 NOTE — Telephone Encounter (Signed)
Advised patient to stop the Propranolol and continue the HCTZ. Keep record of blood pressures and bring to appointment. Will work on Neurology referral for patient. She is agreeable.

## 2017-04-16 ENCOUNTER — Telehealth: Payer: Self-pay | Admitting: Neurology

## 2017-04-16 NOTE — Telephone Encounter (Signed)
Kim Macdonald/[place] called, Kim Macdonald/PA saw the patient on 7/13 and is requesting a f/u appt to eval pt for her chronic migraines.  I called the pt, LVM to call back to schedule appt with NP or Dr AMervyn Skeeters

## 2017-04-16 NOTE — Telephone Encounter (Signed)
Pt has been made aware. 

## 2017-04-19 DIAGNOSIS — E538 Deficiency of other specified B group vitamins: Secondary | ICD-10-CM | POA: Insufficient documentation

## 2017-04-19 DIAGNOSIS — I10 Essential (primary) hypertension: Secondary | ICD-10-CM | POA: Insufficient documentation

## 2017-04-19 DIAGNOSIS — G43709 Chronic migraine without aura, not intractable, without status migrainosus: Secondary | ICD-10-CM | POA: Insufficient documentation

## 2017-04-19 DIAGNOSIS — IMO0002 Reserved for concepts with insufficient information to code with codable children: Secondary | ICD-10-CM | POA: Insufficient documentation

## 2017-04-19 DIAGNOSIS — E559 Vitamin D deficiency, unspecified: Secondary | ICD-10-CM | POA: Insufficient documentation

## 2017-04-19 NOTE — Assessment & Plan Note (Signed)
Patient non-adherent with medications and follow-up with specialist. Will stop Metoprolol and start trial of Propranolol. Follow-up scheduled with Neurology. Close follow-up scheduled here in office.

## 2017-04-19 NOTE — Assessment & Plan Note (Signed)
Endorses taking her Vitamin D as directed despite not being prescribed since March 2018.  Repeat labs today.

## 2017-04-19 NOTE — Assessment & Plan Note (Signed)
Repeat levels today. If not improved, will start injections.

## 2017-04-19 NOTE — Assessment & Plan Note (Signed)
BP above goal but heart rate too low to increase Metoprolol. Stop Metoprolol. Will start Propranolol for migraine and BP as well as HCTZ for BP. DASH diet discussed. Close follow-up scheduled.

## 2017-04-26 ENCOUNTER — Ambulatory Visit: Payer: BLUE CROSS/BLUE SHIELD | Admitting: Physician Assistant

## 2017-04-29 ENCOUNTER — Ambulatory Visit: Payer: BLUE CROSS/BLUE SHIELD | Admitting: Physician Assistant

## 2017-04-29 DIAGNOSIS — Z0289 Encounter for other administrative examinations: Secondary | ICD-10-CM

## 2017-05-02 ENCOUNTER — Ambulatory Visit: Payer: BLUE CROSS/BLUE SHIELD | Admitting: Nurse Practitioner

## 2017-05-03 ENCOUNTER — Telehealth: Payer: Self-pay | Admitting: Physician Assistant

## 2017-05-03 NOTE — Telephone Encounter (Signed)
Just FYI, pt has cancelled her appt for next Tuesday. I noticed that this was her fourth cancellation in a row with one of those being a No Show. Just wanted to make you aware.

## 2017-05-03 NOTE — Telephone Encounter (Signed)
Noted. Will keep an eye on this.

## 2017-05-07 ENCOUNTER — Ambulatory Visit: Payer: BLUE CROSS/BLUE SHIELD | Admitting: Physician Assistant

## 2017-06-05 IMAGING — DX DG CERVICAL SPINE COMPLETE 4+V
7 series · 7 of 7 positions shown · non-contrast
Comparison: None.

CLINICAL DATA: Chronic headache.  Pain.  No reported injury.

EXAM:
CERVICAL SPINE  4+ VIEWS

[c-spine lat]
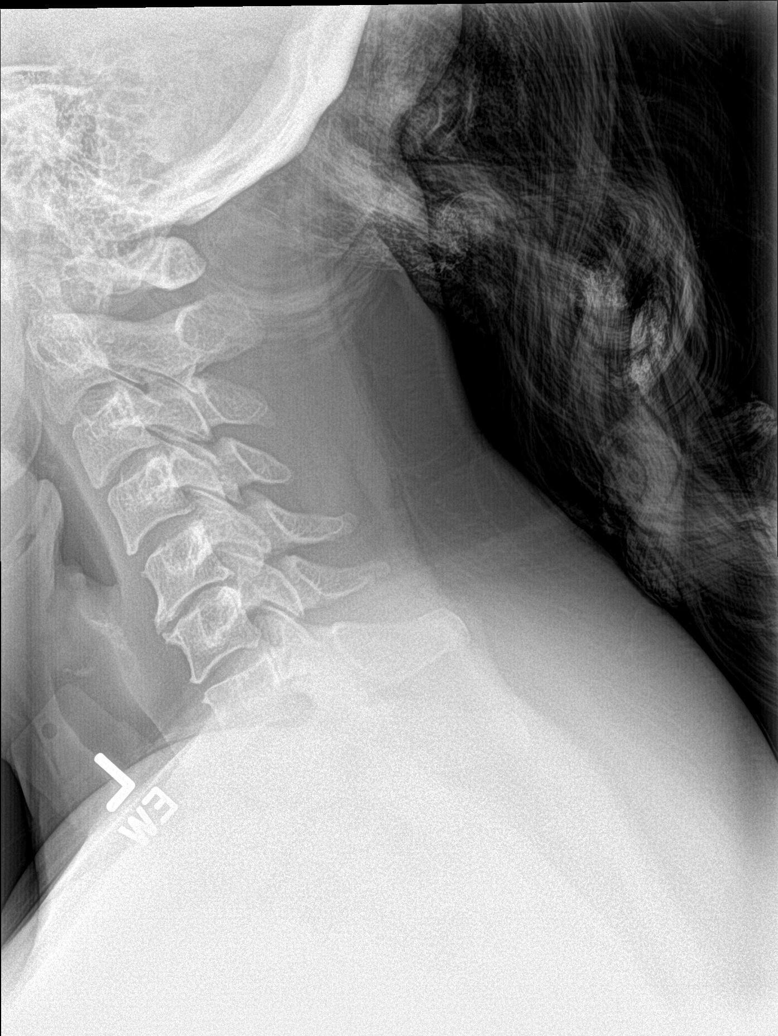

[c-spine obl (1 of 2)]
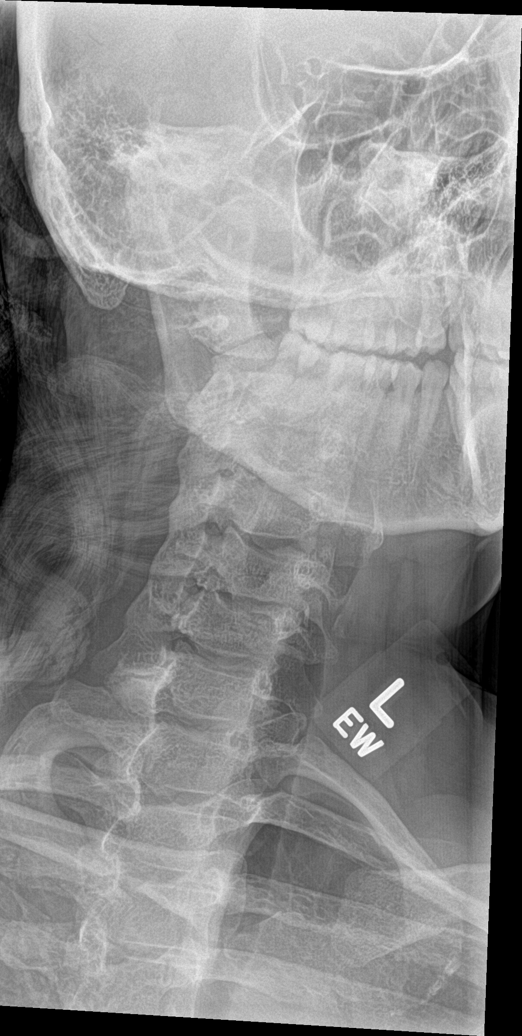

[c-spine obl (2 of 2)]
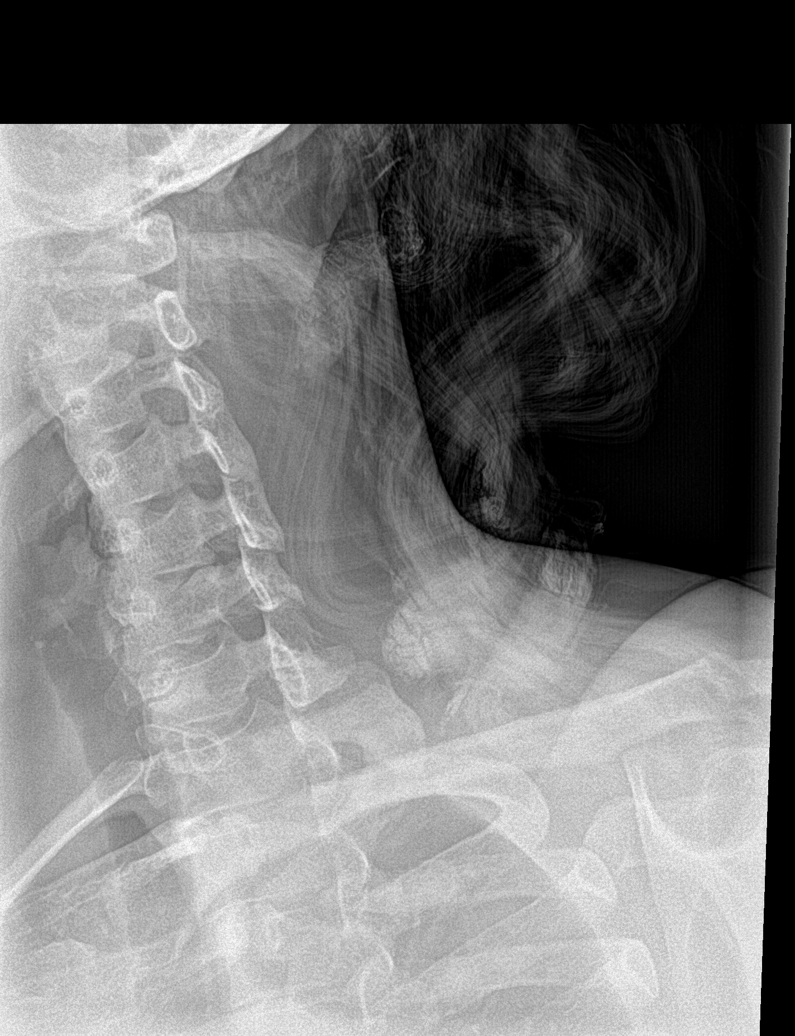

[c-spine ap]
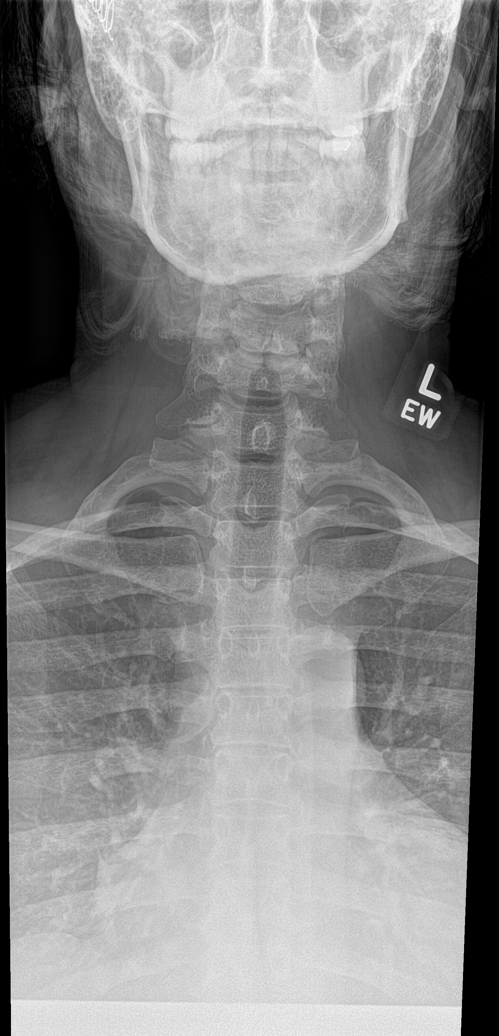

[c-spine open mouth]
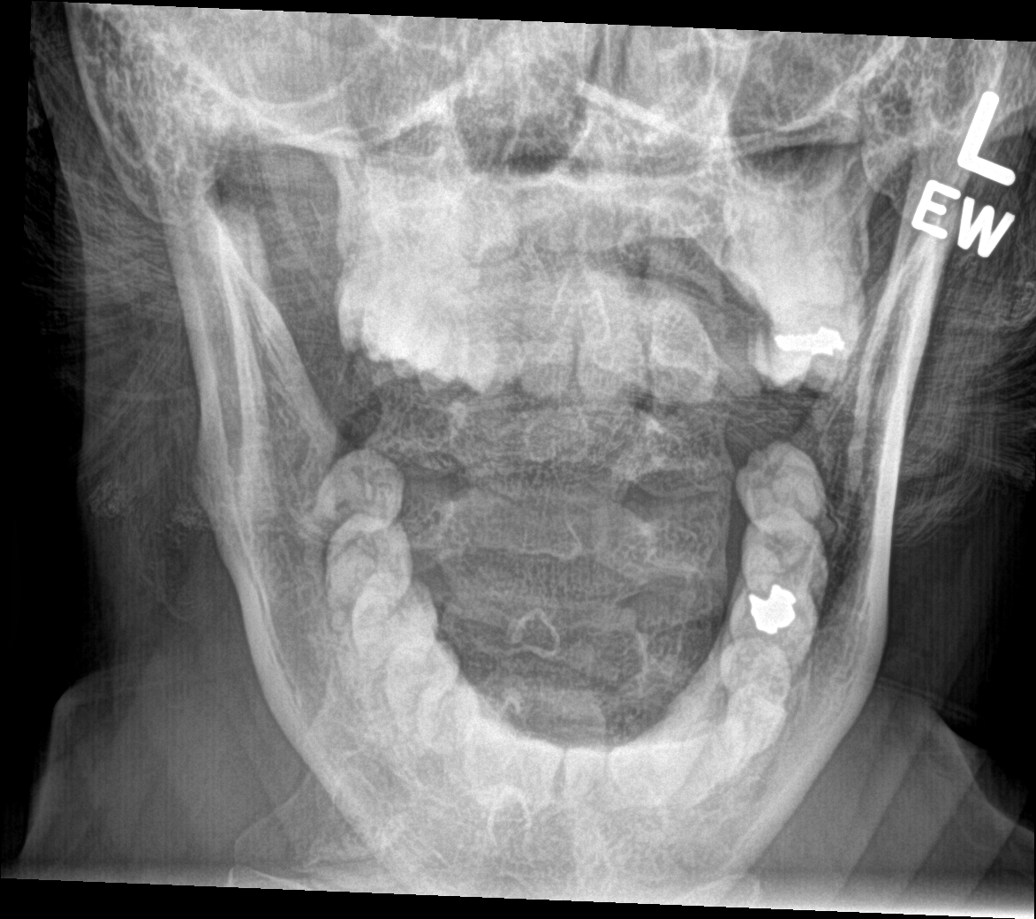

[c-spine swimmers]
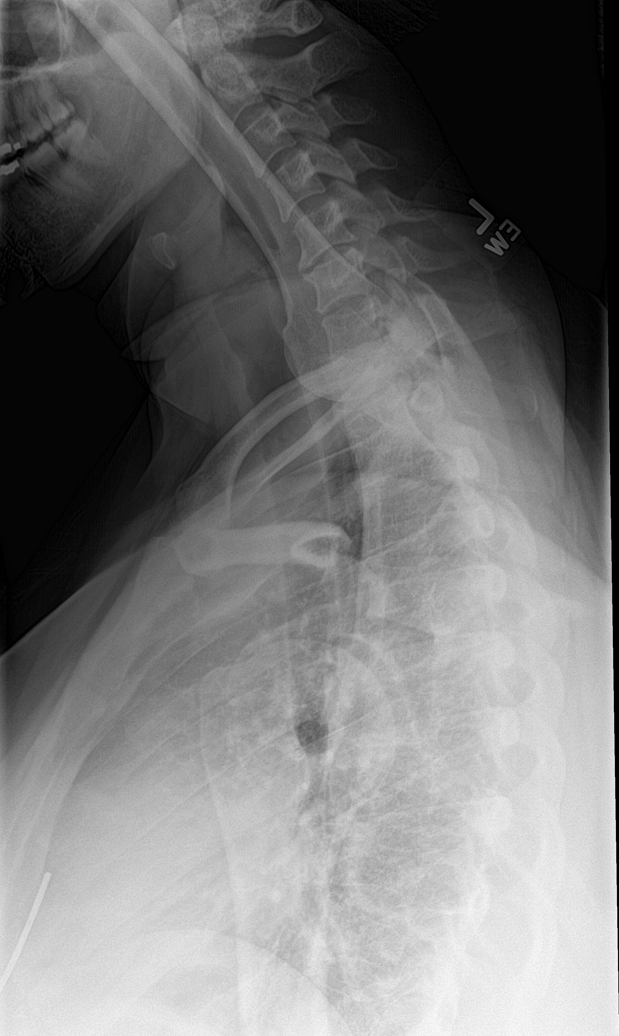

[[person_name]]
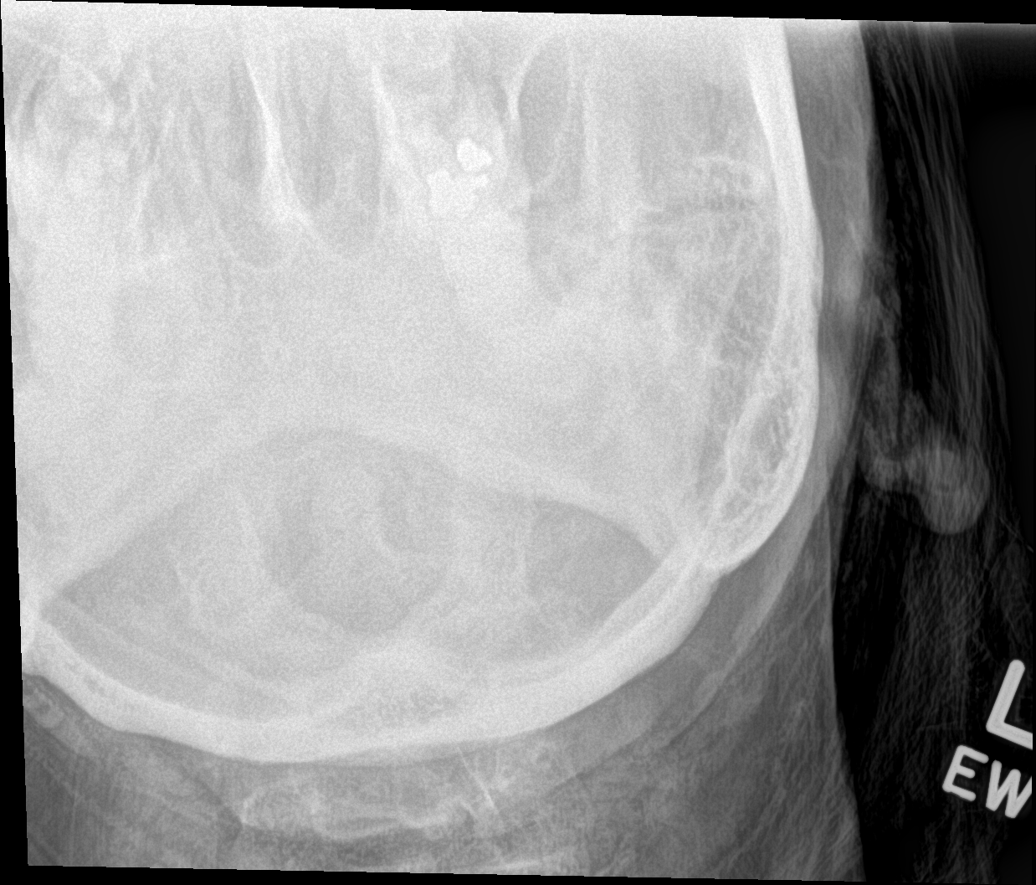

[7 of 7 positions shown; findings below may reference images not displayed]

FINDINGS: Diffuse degenerative change cervical spine. Degenerative changes
most prominent C5-C6. Loss of normal cervical lordosis, this may be
positional or related to torticollis or degenerative change. No
acute bony abnormality identified. Pulmonary apices are clear.
IMPRESSION: Diffuse degenerative change cervical spine with loss of normal
cervical lordosis. Multilevel disc degeneration is present. Disc
degeneration is most prominent at C5-C6. No acute bony abnormality.

## 2017-06-24 NOTE — Progress Notes (Deleted)
[facility]  PATIENT: Kim Macdonald DOB: [birth year]   REASON FOR VISIT: *** HISTORY FROM:    HISTORY OF PRESENT ILLNESS:Kim Macdonald is a 46 y.o. female here as a referral from Dr. Daphine Deutscher for headaches. Here with her partner. Started last Sept or October. No inciting events or head trauma. Sister has migraines. She feels like someone is pounding her in her head. Unilateral but shifts sides, her neck and shoulder hurt. She gets lightheaded, dizzy, nauseated, her left side is always numb and heavy and weak.The left sided numbness/weakness started last Sept or October and may not be associated with headaches. The headaches can be severe. 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  She sees spots and lights sometimes but not always before the headaches. They are pounding. No light sensitivity or sound, she has nausea and sometimes vomits with the headaches. She "jumps" all night, wakes her up. She snores loud and wakes up with headaches. She is tired during the day. She is off balance a lot.Headaches are every day for up to all day. She sometimes tried fioricet but it doesn't help. No side effects to the Topamax. No other focal neurologic deficits, signs or symptoms. Discussed stop the Fioricet as it can cause rebound headaches. Discussed stopping the Fioricet as a cause rebound headaches.   REVIEW OF SYSTEMS: Full 14 system review of systems performed and notable only for those listed, all others are neg:  Constitutional: neg  Cardiovascular: neg Ear/Nose/Throat: neg  Skin: neg Eyes: neg Respiratory: neg Gastroitestinal: neg  Hematology/Lymphatic: neg  Endocrine: neg Musculoskeletal:neg Allergy/Immunology: neg Neurological: neg Psychiatric: neg Sleep : neg   ALLERGIES: Allergies  Allergen Reactions  . Levaquin [Levofloxacin In D5w] Diarrhea and Itching  . Vicodin [Hydrocodone-Acetaminophen] Nausea And Vomiting    HOME MEDICATIONS: Outpatient  Medications Prior to Visit  Medication Sig Dispense Refill  . hydrochlorothiazide (HYDRODIURIL) 25 MG tablet Take 0.5 tablets (12.5 mg total) by mouth daily. 15 tablet 1  . pantoprazole (PROTONIX) 40 MG tablet Take 1 tablet (40 mg total) by mouth daily. 30 tablet 3  . propranolol (INDERAL) 40 MG tablet Take 1 tablet (40 mg total) by mouth 2 (two) times daily. 60 tablet 1  . vitamin B-12 (CYANOCOBALAMIN) 500 MCG tablet Take 500 mcg by mouth daily.    . Vitamin D, Ergocalciferol, (DRISDOL) 50000 units CAPS capsule Take 1 capsule (50,000 Units total) by mouth every 7 (seven) days. 8 capsule 0   No facility-administered medications prior to visit.     PAST MEDICAL HISTORY: Past Medical History:  Diagnosis Date  . Breast discharge    bil unknown  time   . GERD (gastroesophageal reflux disease)   . HA (headache)   . History of chicken pox   . Migraine     PAST SURGICAL HISTORY: Past Surgical History:  Procedure Laterality Date  . CERVICAL SPINE SURGERY    . NO PAST SURGERIES      FAMILY HISTORY: Family History  Problem Relation Age of Onset  . Congestive Heart Failure Mother 71       Deceased  . Stroke Mother   . Diabetes Mother   . Stroke Brother        multiple   . Hypertension Brother        x3  . Hypertension Sister        x4  . Migraines Sister   . Diabetes Sister   . Diabetes Brother   . Diabetes Maternal Grandmother   .  Breast cancer Maternal Aunt   . Heart attack Maternal Uncle   . Healthy Son        x1  . Healthy Daughter        x2    SOCIAL HISTORY: Social History   Social History  . Marital status: Single    Spouse name: N/A  . Number of children: N/A  . Years of education: 28   Occupational History  . Not on file.   Social History Main Topics  . Smoking status: Never Smoker  . Smokeless tobacco: Never Used  . Alcohol use 0.0 oz/week     Comment: rare   . Drug use: No  . Sexual activity: No   Other Topics Concern  . Not on file    Social History Narrative   Lives w/ sister   Caffeine use:  Tea and soda- 2-3 per day     PHYSICAL EXAM  There were no vitals filed for this visit. There is no height or weight on file to calculate BMI.  Generalized: Well developed, in no acute distress  Head: normocephalic and atraumatic,. Oropharynx benign  Neck: Supple, no carotid bruits  Cardiac: Regular rate rhythm, no murmur  Musculoskeletal: No deformity   Neurological examination   Mentation: Alert oriented to time, place, history taking. Attention span and concentration appropriate. Recent and remote memory intact.  Follows all commands speech and language fluent.   Cranial nerve II-XII: Fundoscopic exam reveals sharp disc margins.Pupils were equal round reactive to light extraocular movements were full, visual field were full on confrontational test. Facial sensation and strength were normal. hearing was intact to finger rubbing bilaterally. Uvula tongue midline. head turning and shoulder shrug were normal and symmetric.Tongue protrusion into cheek strength was normal. Motor: normal bulk and tone, full strength in the BUE, BLE, fine finger movements normal, no pronator drift. No focal weakness Sensory: normal and symmetric to light touch, pinprick, and  Vibration, proprioception  Coordination: finger-nose-finger, heel-to-shin bilaterally, no dysmetria Reflexes: Brachioradialis 2/2, biceps 2/2, triceps 2/2, patellar 2/2, Achilles 2/2, plantar responses were flexor bilaterally. Gait and Station: Rising up from seated position without assistance, normal stance,  moderate stride, good arm swing, smooth turning, able to perform tiptoe, and heel walking without difficulty. Tandem gait is steady  DIAGNOSTIC DATA (LABS, IMAGING, TESTING) - I reviewed patient records, labs, notes, testing and imaging myself where available.  Lab Results  Component Value Date   WBC 10.7 (H) 04/12/2017   HGB 12.9 04/12/2017   HCT 38.7 04/12/2017    MCV 96.0 04/12/2017   PLT 301.0 04/12/2017      Component Value Date/Time   NA 138 03/12/2017 1352   NA 143 03/13/2016 0906   K 4.2 03/12/2017 1352   CL 103 03/12/2017 1352   CO2 29 03/12/2017 1352   GLUCOSE 104 (H) 03/12/2017 1352   BUN 11 03/12/2017 1352   BUN 9 03/13/2016 0906   CREATININE 0.84 03/12/2017 1352   CALCIUM 9.5 03/12/2017 1352   PROT 6.9 03/12/2017 1352   PROT 7.0 03/13/2016 0906   ALBUMIN 4.1 03/12/2017 1352   ALBUMIN 4.0 03/13/2016 0906   AST 16 03/12/2017 1352   ALT 13 03/12/2017 1352   ALKPHOS 73 03/12/2017 1352   BILITOT 0.6 03/12/2017 1352   BILITOT 0.3 03/13/2016 0906   GFRNONAA >60 07/19/2016 1940   GFRAA >60 07/19/2016 1940   Lab Results  Component Value Date   CHOL 150 12/01/2014   HDL 43.80 12/01/2014   LDLCALC 92  12/01/2014   TRIG 71.0 12/01/2014   CHOLHDL 3 12/01/2014   No results found for: HGBA1C Lab Results  Component Value Date   VITAMINB12 165 (L) 04/12/2017   Lab Results  Component Value Date   TSH 1.69 03/12/2017    ***  ASSESSMENT AND PLAN     46 year old female with chronic migraines without status not intractable.  Significant snoring, periodic limb movements of sleep, morning headaches; Sleep evaluation Preventative medication: Will increase her Topamax 100 mg at night. May need to increase it even further. Acute management: At onset take Imitrex. May repeat in 2 hours. May take with the Zofran for nausea. Do not take more than twice in 1 day or 2-3 days a week. MRI of the brain was unremarkable last year, no need for repeat since she is having similar symptoms. If symptoms do not improve in the next several months on new  regimen I've asked her to call me for repeat MRI of the brain or come back to clinic sooner. We'll repeat CMP today for updated labs and LFTs as well This is my first time seeing patient, I can't fill out any disability forms for her as she requests, I have modified her migraine management so  hopefully she will start feeling better. I recommend close follow-up with primary care for vascular risk factors. Recommend weight loss and diet. Education on lifestyle changes, migraine triggers, and headache diary.   Nilda Riggs, [facility], [facility], APRN  [facility] [address], [address] [place], [place] [address] [phone]

## 2017-06-25 ENCOUNTER — Ambulatory Visit: Payer: BLUE CROSS/BLUE SHIELD | Admitting: Nurse Practitioner

## 2017-06-26 ENCOUNTER — Encounter: Payer: Self-pay | Admitting: Nurse Practitioner

## 2017-10-23 ENCOUNTER — Ambulatory Visit: Payer: BLUE CROSS/BLUE SHIELD | Admitting: Physician Assistant

## 2017-10-23 ENCOUNTER — Ambulatory Visit: Payer: Self-pay | Admitting: *Deleted

## 2017-10-23 DIAGNOSIS — Z0289 Encounter for other administrative examinations: Secondary | ICD-10-CM

## 2017-10-23 NOTE — Telephone Encounter (Signed)
Patient was calling to cancel her appointment - she is at another appointment and can not make it in time. She did mention a headache-so she was sent to triage. Patient has high blood pressure - she checked it earlier and it was 180/98- she states she took her blood pressure medication after she got that reading. She states she takes her medication daily.She also mentions that she has tingling in her hands at times- she did have disc surgery to try to relieve this- but it did not.  Made patient promise to take her BP when she gets home- gave her perimeters- per protocol- with instructions to go to ED if she has elevated BP or symptoms listed before her appointment in the morning. She voiced understanding.  Reason for Disposition . Systolic BP  >= 180 OR Diastolic >= 110  Answer Assessment - Initial Assessment Questions 1. BLOOD PRESSURE: "What is the blood pressure?" "Did you take at least two measurements 5 minutes apart?"     180/98- patient is not where she can take her BP again 2. ONSET: "When did you take your blood pressure?"     Earlier today 3. HOW: "How did you obtain the blood pressure?" (e.g., visiting nurse, automatic home BP monitor)     Automatic machine 4. HISTORY: "Do you have a history of high blood pressure?"     yes 5. MEDICATIONS: "Are you taking any medications for blood pressure?" "Have you missed any doses recently?"     Yes- patient reports no missed doses 6. OTHER SYMPTOMS: "Do you have any symptoms?" (e.g., headache, chest pain, blurred vision, difficulty breathing, weakness)     headache 7. PREGNANCY: "Is there any chance you are pregnant?" "When was your last menstrual period?"     Not asked  Protocols used: HIGH BLOOD PRESSURE-A-AH

## 2017-10-24 ENCOUNTER — Telehealth: Payer: Self-pay

## 2017-10-24 ENCOUNTER — Encounter (HOSPITAL_BASED_OUTPATIENT_CLINIC_OR_DEPARTMENT_OTHER): Payer: Self-pay | Admitting: *Deleted

## 2017-10-24 ENCOUNTER — Ambulatory Visit: Payer: BLUE CROSS/BLUE SHIELD | Admitting: Physician Assistant

## 2017-10-24 ENCOUNTER — Other Ambulatory Visit: Payer: Self-pay

## 2017-10-24 ENCOUNTER — Ambulatory Visit: Payer: Self-pay | Admitting: *Deleted

## 2017-10-24 ENCOUNTER — Emergency Department (HOSPITAL_BASED_OUTPATIENT_CLINIC_OR_DEPARTMENT_OTHER)
Admission: EM | Admit: 2017-10-24 | Discharge: 2017-10-24 | Discharge status: Against Medical Advice | Payer: BLUE CROSS/BLUE SHIELD | Attending: Emergency Medicine | Admitting: Emergency Medicine

## 2017-10-24 DIAGNOSIS — Z5321 Procedure and treatment not carried out due to patient leaving prior to being seen by health care provider: Secondary | ICD-10-CM | POA: Insufficient documentation

## 2017-10-24 DIAGNOSIS — I10 Essential (primary) hypertension: Secondary | ICD-10-CM | POA: Diagnosis not present

## 2017-10-24 HISTORY — DX: Essential (primary) hypertension: I10

## 2017-10-24 NOTE — ED Notes (Signed)
No answer in w/a when called for VS recheck.

## 2017-10-24 NOTE — ED Notes (Signed)
No answer x2 

## 2017-10-24 NOTE — Telephone Encounter (Signed)
Patient is returning the office call- she states she felt so bad today that she could not make her appointment. She states her BP this morning was 152/101. Last night when she checked it - it was 161/114 and 149/96. She states she has been suffering with sever headaches and dizziness. Discussed the importance of getting this BP under control- patient is going to ED now to be evaluated. (I spoke to patient last night)

## 2017-10-24 NOTE — Telephone Encounter (Signed)
Copied from CRM [id number]. Topic: Quick Communication - Appointment Cancellation >> Oct 24, 2017  7:40 AM Crist InfanteHarrald, Kathy J wrote: Patient called to [facility] Patient HAS rescheduled their appointment. pt states she is light headed and cannot drive  Route to department's PEC pool.  LM requesting call back to discuss/triage symptoms.

## 2017-10-24 NOTE — ED Triage Notes (Addendum)
States she took her BP at home and it was 185/118. She called her Drs office and was told to come to the ED. Asymptomatic.

## 2017-10-25 ENCOUNTER — Encounter: Payer: Self-pay | Admitting: Physician Assistant

## 2017-10-25 ENCOUNTER — Ambulatory Visit: Payer: BLUE CROSS/BLUE SHIELD | Admitting: Physician Assistant

## 2017-10-25 ENCOUNTER — Other Ambulatory Visit: Payer: Self-pay

## 2017-10-25 ENCOUNTER — Encounter: Payer: Self-pay | Admitting: Emergency Medicine

## 2017-10-25 VITALS — BP 160/110 | HR 76 | Temp 97.7°F | Resp 14 | Ht 67.0 in | Wt 240.0 lb

## 2017-10-25 DIAGNOSIS — I10 Essential (primary) hypertension: Secondary | ICD-10-CM | POA: Diagnosis not present

## 2017-10-25 MED ORDER — AMLODIPINE BESY-BENAZEPRIL HCL 5-10 MG PO CAPS: 1.0000 | ORAL_CAPSULE | Freq: Every day | ORAL | 1 refills | Status: DC

## 2017-10-25 NOTE — Progress Notes (Signed)
Patient presents to clinic today c/o elevated BP measurements over the past few months, now associated with headaches. Patient with history of migraines and hypertension, currently on a regimen of HCTZ 12.5 mg and Propranolol 40 mg BID. Is not taking the HCTZ and only recently started back on her Propranolol, taking 1-2 times daily. BP averaging 170/90-100. Notes headaches. Denies chest pain, palpitations, shortness of breath. Was previously set up with Neurology for chronic migraines but did not go to appointments.  Past Medical History:  Diagnosis Date  . Breast discharge    bil unknown  time   . GERD (gastroesophageal reflux disease)   . HA (headache)   . History of chicken pox   . Hypertension   . Migraine     Current Outpatient Medications on File Prior to Visit  Medication Sig Dispense Refill  . propranolol (INDERAL) 40 MG tablet Take 1 tablet (40 mg total) by mouth 2 (two) times daily. 60 tablet 1  . vitamin B-12 (CYANOCOBALAMIN) 500 MCG tablet Take 500 mcg by mouth daily.    . hydrochlorothiazide (HYDRODIURIL) 25 MG tablet Take 0.5 tablets (12.5 mg total) by mouth daily. (Patient not taking: Reported on 10/25/2017) 15 tablet 1   No current facility-administered medications on file prior to visit.     Allergies  Allergen Reactions  . Levaquin [Levofloxacin In D5w] Diarrhea and Itching  . Vicodin [Hydrocodone-Acetaminophen] Nausea And Vomiting    Family History  Problem Relation Age of Onset  . Congestive Heart Failure Mother 70       Deceased  . Stroke Mother   . Diabetes Mother   . Stroke Brother        multiple   . Hypertension Brother        x3  . Hypertension Sister        x4  . Migraines Sister   . Diabetes Sister   . Diabetes Brother   . Diabetes Maternal Grandmother   . Breast cancer Maternal Aunt   . Heart attack Maternal Uncle   . Healthy Son        x1  . Healthy Daughter        x2    Social History   Socioeconomic History  . Marital status:  Single    Spouse name: None  . Number of children: None  . Years of education: 34  . Highest education level: None  Social Needs  . Financial resource strain: None  . Food insecurity - worry: None  . Food insecurity - inability: None  . Transportation needs - medical: None  . Transportation needs - non-medical: None  Occupational History  . None  Tobacco Use  . Smoking status: Never Smoker  . Smokeless tobacco: Never Used  Substance and Sexual Activity  . Alcohol use: Yes    Alcohol/week: 0.0 oz    Comment: rare   . Drug use: No  . Sexual activity: No    Partners: Male    Birth control/protection: None  Other Topics Concern  . None  Social History Narrative   Lives w/ sister   Caffeine use:  Tea and soda- 2-3 per day   Review of Systems - See HPI.  All other ROS are negative.  BP (!) 160/110   Pulse 76   Temp 97.7 F (36.5 C) (Oral)   Resp 14   Ht 5\' 7"  (1.702 m)   Wt 240 lb (108.9 kg)   LMP 10/20/2017   SpO2 98%   BMI  37.59 kg/m   Physical Exam  Constitutional: She is oriented to person, place, and time and well-developed, well-nourished, and in no distress.  HENT:  Head: Normocephalic and atraumatic.  Right Ear: Tympanic membrane normal.  Left Ear: Tympanic membrane normal.  Nose: Nose normal.  Eyes: Conjunctivae are normal.  Neck: Neck supple.  Cardiovascular: Normal rate, regular rhythm, normal heart sounds and intact distal pulses.  Pulmonary/Chest: Effort normal and breath sounds normal. No respiratory distress. She has no wheezes. She has no rales. She exhibits no tenderness.  Neurological: She is alert and oriented to person, place, and time.  Skin: Skin is warm and dry. No rash noted.  Psychiatric: Affect normal.  Vitals reviewed.  Assessment/Plan: 1. Hypertension, uncontrolled EKG without changes from prior tracings. No murmur auscultated. Patient non-adherent previously to recommendations. Will stop Propranolol since is not helping. Will try  Amplodipine-Benazepril once daily. DASH diet. Close follow-up scheduled.   - EKG 12-Lead - amLODipine-benazepril (LOTREL) 5-10 MG capsule; Take 1 capsule by mouth daily.  Dispense: 30 capsule; Refill: 1   Piedad ClimesWilliam Cody Sheddrick Lattanzio, [place] JerseyPA-C

## 2017-10-25 NOTE — Patient Instructions (Signed)
Stop the Propranolol. Start the Amlodipine-Benazepril capsule once daily for BP. Follow diet below. Keep well-hydrated. I am re-ordering your echocardiogram. I want you to make sure to have this time. Follow-up with me in 1 week.  Take the Pantoprazole daily over the next two weeks. Limit late-night eating when possible.   Any acute worsening of symptoms, please go to the ER.    DASH Eating Plan DASH stands for "Dietary Approaches to Stop Hypertension." The DASH eating plan is a healthy eating plan that has been shown to reduce high blood pressure (hypertension). It may also reduce your risk for type 2 diabetes, heart disease, and stroke. The DASH eating plan may also help with weight loss. What are tips for following this plan? General guidelines  Avoid eating more than 2,300 mg (milligrams) of salt (sodium) a day. If you have hypertension, you may need to reduce your sodium intake to 1,500 mg a day.  Limit alcohol intake to no more than 1 drink a day for nonpregnant women and 2 drinks a day for men. One drink equals 12 oz of beer, 5 oz of wine, or 1 oz of hard liquor.  Work with your health care provider to maintain a healthy body weight or to lose weight. Ask what an ideal weight is for you.  Get at least 30 minutes of exercise that causes your heart to beat faster (aerobic exercise) most days of the week. Activities may include walking, swimming, or biking.  Work with your health care provider or diet and nutrition specialist (dietitian) to adjust your eating plan to your individual calorie needs. Reading food labels  Check food labels for the amount of sodium per serving. Choose foods with less than 5 percent of the Daily Value of sodium. Generally, foods with less than 300 mg of sodium per serving fit into this eating plan.  To find whole grains, look for the word "whole" as the first word in the ingredient list. Shopping  Buy products labeled as "low-sodium" or "no salt  added."  Buy fresh foods. Avoid canned foods and premade or frozen meals. Cooking  Avoid adding salt when cooking. Use salt-free seasonings or herbs instead of table salt or sea salt. Check with your health care provider or pharmacist before using salt substitutes.  Do not fry foods. Cook foods using healthy methods such as baking, boiling, grilling, and broiling instead.  Cook with heart-healthy oils, such as olive, canola, soybean, or sunflower oil. Meal planning   Eat a balanced diet that includes: ? 5 or more servings of fruits and vegetables each day. At each meal, try to fill half of your plate with fruits and vegetables. ? Up to 6-8 servings of whole grains each day. ? Less than 6 oz of lean meat, poultry, or fish each day. A 3-oz serving of meat is about the same size as a deck of cards. One egg equals 1 oz. ? 2 servings of low-fat dairy each day. ? A serving of nuts, seeds, or beans 5 times each week. ? Heart-healthy fats. Healthy fats called Omega-3 fatty acids are found in foods such as flaxseeds and coldwater fish, like sardines, salmon, and mackerel.  Limit how much you eat of the following: ? Canned or prepackaged foods. ? Food that is high in trans fat, such as fried foods. ? Food that is high in saturated fat, such as fatty meat. ? Sweets, desserts, sugary drinks, and other foods with added sugar. ? Full-fat dairy products.  Do  not salt foods before eating.  Try to eat at least 2 vegetarian meals each week.  Eat more home-cooked food and less restaurant, buffet, and fast food.  When eating at a restaurant, ask that your food be prepared with less salt or no salt, if possible. What foods are recommended? The items listed may not be a complete list. Talk with your dietitian about what dietary choices are best for you. Grains Whole-grain or whole-wheat bread. Whole-grain or whole-wheat pasta. Brown rice. [place]row. Bulgur. Whole-grain and low-sodium cereals.  Pita bread. Low-fat, low-sodium crackers. Whole-wheat flour tortillas. Vegetables Fresh or frozen vegetables (raw, steamed, roasted, or grilled). Low-sodium or reduced-sodium tomato and vegetable juice. Low-sodium or reduced-sodium tomato sauce and tomato paste. Low-sodium or reduced-sodium canned vegetables. Fruits All fresh, dried, or frozen fruit. Canned fruit in natural juice (without added sugar). Meat and other protein foods Skinless chicken or [place]. Ground chicken or [place]. Pork with fat trimmed off. Fish and seafood. Egg whites. Dried beans, peas, or lentils. Unsalted nuts, nut butters, and seeds. Unsalted canned beans. Lean cuts of beef with fat trimmed off. Low-sodium, lean deli meat. Dairy Low-fat (1%) or fat-free (skim) milk. Fat-free, low-fat, or reduced-fat cheeses. Nonfat, low-sodium ricotta or cottage cheese. Low-fat or nonfat yogurt. Low-fat, low-sodium cheese. Fats and oils Soft margarine without trans fats. Vegetable oil. Low-fat, reduced-fat, or light mayonnaise and salad dressings (reduced-sodium). Canola, safflower, olive, soybean, and sunflower oils. Avocado. Seasoning and other foods Herbs. Spices. Seasoning mixes without salt. Unsalted popcorn and pretzels. Fat-free sweets. What foods are not recommended? The items listed may not be a complete list. Talk with your dietitian about what dietary choices are best for you. Grains Baked goods made with fat, such as croissants, muffins, or some breads. Dry pasta or rice meal packs. Vegetables Creamed or fried vegetables. Vegetables in a cheese sauce. Regular canned vegetables (not low-sodium or reduced-sodium). Regular canned tomato sauce and paste (not low-sodium or reduced-sodium). Regular tomato and vegetable juice (not low-sodium or reduced-sodium). Angie Fava. Olives. Fruits Canned fruit in a light or heavy syrup. Fried fruit. Fruit in cream or butter sauce. Meat and other protein foods Fatty cuts of meat. Ribs. Fried  meat. Berniece Salines. Sausage. Bologna and other processed lunch meats. Salami. Fatback. Hotdogs. Bratwurst. Salted nuts and seeds. Canned beans with added salt. Canned or smoked fish. Whole eggs or egg yolks. Chicken or [place] with skin. Dairy Whole or 2% milk, cream, and half-and-half. Whole or full-fat cream cheese. Whole-fat or sweetened yogurt. Full-fat cheese. Nondairy creamers. Whipped toppings. Processed cheese and cheese spreads. Fats and oils Butter. Stick margarine. Lard. Shortening. Ghee. Bacon fat. Tropical oils, such as coconut, palm kernel, or palm oil. Seasoning and other foods Salted popcorn and pretzels. Onion salt, garlic salt, seasoned salt, table salt, and sea salt. Worcestershire sauce. Tartar sauce. Barbecue sauce. Teriyaki sauce. Soy sauce, including reduced-sodium. Steak sauce. Canned and packaged gravies. Fish sauce. Oyster sauce. Cocktail sauce. Horseradish that you find on the shelf. Ketchup. Mustard. Meat flavorings and tenderizers. Bouillon cubes. Hot sauce and Tabasco sauce. Premade or packaged marinades. Premade or packaged taco seasonings. Relishes. Regular salad dressings. Where to find more information:  National Heart, Lung, and [place]: https://wilson-eaton.com/  American Heart Association: www.heart.org Summary  The DASH eating plan is a healthy eating plan that has been shown to reduce high blood pressure (hypertension). It may also reduce your risk for type 2 diabetes, heart disease, and stroke.  With the DASH eating plan, you should limit salt (sodium) intake  to 2,300 mg a day. If you have hypertension, you may need to reduce your sodium intake to 1,500 mg a day.  When on the DASH eating plan, aim to eat more fresh fruits and vegetables, whole grains, lean proteins, low-fat dairy, and heart-healthy fats.  Work with your health care provider or diet and nutrition specialist (dietitian) to adjust your eating plan to your individual calorie needs. This information is  not intended to replace advice given to you by your health care provider. Make sure you discuss any questions you have with your health care provider. Document Released: 09/06/2011 Document Revised: 09/10/2016 Document Reviewed: 09/10/2016 Elsevier Interactive Patient Education  [employer].

## 2017-10-28 ENCOUNTER — Telehealth: Payer: Self-pay | Admitting: Physician Assistant

## 2017-10-28 ENCOUNTER — Ambulatory Visit: Payer: BLUE CROSS/BLUE SHIELD | Admitting: Physician Assistant

## 2017-10-28 DIAGNOSIS — I10 Essential (primary) hypertension: Secondary | ICD-10-CM

## 2017-10-28 MED ORDER — AMLODIPINE BESY-BENAZEPRIL HCL 5-10 MG PO CAPS: 2.0000 | ORAL_CAPSULE | Freq: Every day | ORAL | 1 refills | Status: DC

## 2017-10-28 NOTE — Telephone Encounter (Signed)
Spoke with patient about headache and blood pressure. Her current BP was 172/112 P 84. She took her Amlodipine-Benazapril at 8:30 this morning. Denies any dizziness, lightheaded or vision changes. Last Ibuprofen took was last night for headache. Per PCP to take another Amlodipine-Benazapril today and take Excedrin Migraine for headache. Keep check of BP and scheduled an appt for Thursday 10/31/17. She is advised to take 2 Amlodipine-Benazapril until appointment and Excedrin Migraine. She is agreeable.

## 2017-10-28 NOTE — Telephone Encounter (Signed)
Copied from CRM [id number]. Topic: Quick Communication - See Telephone Encounter >> Oct 28, 2017 11:13 AM Oneal GroutSebastian, Jennifer S wrote: CRM for notification. See Telephone encounter for:  Was seen on 10/25/17, is not feeling better, does not believe meds are working, unable to sleep, up all night, still having headache 10/28/17.

## 2017-10-31 ENCOUNTER — Telehealth: Payer: Self-pay | Admitting: Physician Assistant

## 2017-10-31 ENCOUNTER — Ambulatory Visit: Payer: BLUE CROSS/BLUE SHIELD | Admitting: Physician Assistant

## 2017-10-31 ENCOUNTER — Emergency Department (HOSPITAL_BASED_OUTPATIENT_CLINIC_OR_DEPARTMENT_OTHER)
Admission: EM | Admit: 2017-10-31 | Discharge: 2017-11-01 | Disposition: A | Discharge status: Home / Self Care | Payer: BLUE CROSS/BLUE SHIELD | Attending: Emergency Medicine | Admitting: Emergency Medicine

## 2017-10-31 ENCOUNTER — Encounter (HOSPITAL_BASED_OUTPATIENT_CLINIC_OR_DEPARTMENT_OTHER): Payer: Self-pay

## 2017-10-31 ENCOUNTER — Encounter: Payer: Self-pay | Admitting: Physician Assistant

## 2017-10-31 ENCOUNTER — Other Ambulatory Visit: Payer: Self-pay

## 2017-10-31 DIAGNOSIS — Z79899 Other long term (current) drug therapy: Secondary | ICD-10-CM | POA: Insufficient documentation

## 2017-10-31 DIAGNOSIS — I10 Essential (primary) hypertension: Secondary | ICD-10-CM | POA: Insufficient documentation

## 2017-10-31 NOTE — ED Triage Notes (Signed)
Pt c/o lightheadedness, dizziness, headaches and HTN for over a week, states she is compliant with all of her meds, charting from PCP states otherwise

## 2017-10-31 NOTE — Telephone Encounter (Signed)
Patient dismissed from [facility] by Marcelline MatesWilliam Martin PA-C , effective October 31, 2017. Dismissal letter sent out by certified / registered mail.  daj

## 2017-11-01 ENCOUNTER — Telehealth: Payer: Self-pay | Admitting: Physician Assistant

## 2017-11-01 LAB — URINALYSIS, ROUTINE W REFLEX MICROSCOPIC
Bilirubin Urine: NEGATIVE
GLUCOSE, UA: NEGATIVE mg/dL
Ketones, ur: NEGATIVE mg/dL
LEUKOCYTES UA: NEGATIVE
Nitrite: NEGATIVE
PH: 6.5 (ref 5.0–8.0)
Protein, ur: NEGATIVE mg/dL
Specific Gravity, Urine: 1.025 (ref 1.005–1.030)

## 2017-11-01 LAB — URINALYSIS, MICROSCOPIC (REFLEX): WBC, UA: NONE SEEN WBC/hpf (ref 0–5)

## 2017-11-01 LAB — PREGNANCY, URINE: Preg Test, Ur: NEGATIVE

## 2017-11-01 NOTE — Telephone Encounter (Signed)
Spoke with patient regarding blood pressure. Pt states she "almost passed out" at work yesterday, BP at that time was 170/108. Patient went to the Emergency Room for evaluation. Patient concerned that she has headache every morning, not relieved by OTC pain medications. BP currently is 137/91, reports slight headache. Patient declined f/u appointment for today, scheduled for Monday, 11/04/17 @ 11 with PCP. Encouraged to monitor BP at home and go back to the Emergency Room with high reading and symptomatic. Patient verbalized understanding.

## 2017-11-01 NOTE — ED Notes (Signed)
States got dizzy at work  Check bp was slightly elevated

## 2017-11-01 NOTE — Telephone Encounter (Signed)
It is hard for me to help if she will not come in to be seen and will not go to appointments with Neurology that we have set up for her.   Will see her Monday. ER for any continued or worsening symptoms.

## 2017-11-01 NOTE — ED Provider Notes (Signed)
[facility] EMERGENCY DEPARTMENT Provider Note   CSN: [id number] Arrival date & time: 10/31/17  2313     History   Chief Complaint Chief Complaint  Patient presents with  . Hypertension    HPI Kim Macdonald is a 47 y.o. female.  The history is provided by the patient.  Hypertension  This is a chronic problem. The current episode started more than 1 week ago. The problem occurs constantly. The problem has not changed since onset.Pertinent negatives include no chest pain, no abdominal pain, no headaches and no shortness of breath. Nothing aggravates the symptoms. Nothing relieves the symptoms. Treatments tried: took evening meds. The treatment provided significant relief.    Past Medical History:  Diagnosis Date  . Breast discharge    bil unknown  time   . GERD (gastroesophageal reflux disease)   . HA (headache)   . History of chicken pox   . Hypertension   . Migraine     Patient Active Problem List   Diagnosis Date Noted  . Hypertension 04/19/2017  . Chronic migraine 04/19/2017  . B12 deficiency 04/19/2017  . Vitamin D deficiency 04/19/2017  . Fatigue 10/18/2016  . 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 Sleep related headaches 03/15/2016  . Snoring 03/15/2016  . 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 Migraine with aura and without status migrainosus 03/13/2016  . Cervical neck pain with evidence of disc disease 07/04/2015  . 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 CERVICAL SPINE SURGERY    . NO PAST SURGERIES      OB History    No data available       Home Medications    Prior to Admission medications   Medication Sig Start Date End Date Taking? Authorizing Provider  amLODipine-benazepril (LOTREL) 5-10 MG capsule Take 2 capsules by mouth daily. 10/28/17   [name], PA-C  vitamin B-12 (CYANOCOBALAMIN) 500 MCG tablet Take 500 mcg by mouth  daily.    [provider]    Family History Family History  Problem Relation Age of Onset  . Congestive Heart Failure Mother 47       Deceased  . Stroke Mother   . Diabetes Mother   . Stroke Brother        multiple   . Hypertension Brother        x3  . Hypertension Sister        x4  . Migraines Sister   . Diabetes Sister   . Diabetes Brother   . Diabetes Maternal Grandmother   . Breast cancer Maternal Aunt   . Heart attack Maternal Uncle   . Healthy Son        x1  . Healthy Daughter        x2    Social History Social History   Tobacco Use  . Smoking status: Never Smoker  . Smokeless tobacco: Never Used  Substance Use Topics  . Alcohol use: Yes    Alcohol/week: 0.0 oz    Comment: rare   . Drug use: No     Allergies   Levaquin [levofloxacin in d5w] and Vicodin [hydrocodone-acetaminophen]   Review of Systems Review of Systems  Constitutional: Negative for diaphoresis.  Eyes: Negative for photophobia.  Respiratory: Negative for chest tightness and shortness of breath.   Cardiovascular: Negative for chest pain.  Gastrointestinal: Negative for abdominal pain.  Neurological: Negative for dizziness,  tremors, seizures, syncope, facial asymmetry, speech difficulty, weakness, light-headedness, numbness and headaches.  All other systems reviewed and are negative.    Physical Exam Updated Vital Signs BP 136/82   Pulse 75   Temp 98.3 F (36.8 C) (Oral)   Resp 16   Ht 5\' 7"  (1.702 m)   Wt 108.9 kg (240 lb)   LMP 10/24/2017   SpO2 100%   BMI 37.59 kg/m   Physical Exam  Constitutional: She is oriented to person, place, and time. She appears well-developed and well-nourished. No distress.  HENT:  Head: Normocephalic and atraumatic.  Nose: Nose normal.  Mouth/Throat: No oropharyngeal exudate.  Eyes: Conjunctivae and EOM are normal. Pupils are equal, round, and reactive to light.  Neck: Normal range of motion. Neck supple.  Cardiovascular: Normal  rate, regular rhythm, normal heart sounds and intact distal pulses.  Pulmonary/Chest: Effort normal and breath sounds normal. No respiratory distress.  Abdominal: Soft. Bowel sounds are normal. She exhibits no mass. There is no tenderness. There is no rebound and no guarding.  Musculoskeletal: Normal range of motion.  Neurological: She is alert and oriented to person, place, and time. She displays normal reflexes. No cranial nerve deficit. She exhibits normal muscle tone.  Skin: Skin is warm and dry. Capillary refill takes less than 2 seconds.  Psychiatric: She has a normal mood and affect.     ED Treatments / Results  Labs (all labs ordered are listed, but only abnormal results are displayed)  Results for orders placed or performed during the hospital encounter of 10/31/17  Urinalysis, Routine w reflex microscopic  Result Value Ref Range   Color, Urine YELLOW YELLOW   APPearance CLEAR CLEAR   Specific Gravity, Urine 1.025 1.005 - 1.030   pH 6.5 5.0 - 8.0   Glucose, UA NEGATIVE NEGATIVE mg/dL   Hgb urine dipstick MODERATE (A) NEGATIVE   Bilirubin Urine NEGATIVE NEGATIVE   Ketones, ur NEGATIVE NEGATIVE mg/dL   Protein, ur NEGATIVE NEGATIVE mg/dL   Nitrite NEGATIVE NEGATIVE   Leukocytes, UA NEGATIVE NEGATIVE  Pregnancy, urine  Result Value Ref Range   Preg Test, Ur NEGATIVE NEGATIVE  Urinalysis, Microscopic (reflex)  Result Value Ref Range   RBC / HPF 6-30 0 - 5 RBC/hpf   WBC, UA NONE SEEN 0 - 5 WBC/hpf   Bacteria, UA RARE (A) NONE SEEN   Squamous Epithelial / LPF 0-5 (A) NONE SEEN   No results found.  EKG  EKG Interpretation  Date/Time:  Thursday October 31 2017 23:28:53 EST Ventricular Rate:  75 PR Interval:    QRS Duration: 78 QT Interval:  383 QTC Calculation: 428 R Axis:   19 Text Interpretation:  Sinus rhythm Confirmed by Nicanor Alcon, Nija Koopman ([id number]) on 11/01/2017 12:17:24 AM       Radiology No results found.  Procedures Procedures (including critical care  time)  Medications Ordered in ED Medications - No data to display     Final Clinical Impressions(s) / ED Diagnoses   Final diagnoses:  Essential hypertension    Return for weakness, numbness, changes in vision or speech,  fevers > 100.4 unrelieved by medication, shortness of breath, intractable vomiting, or diarrhea, abdominal pain, Inability to tolerate liquids or food, cough, altered mental status or any concerns. No signs of systemic illness or infection. The patient is nontoxic-appearing on exam and vital signs are within normal limits.    I have reviewed the triage vital signs and the nursing notes. Pertinent labs &imaging results that were available during  my care of the patient were reviewed by me and considered in my medical decision making (see chart for details).  After history, exam, and medical workup I feel the patient has been appropriately medically screened and is safe for discharge home. Pertinent diagnoses were discussed with the patient. Patient was given return precautions.   Orphia Mctigue, MD 11/01/17 [phone]

## 2017-11-01 NOTE — Telephone Encounter (Signed)
Call and talk to patient to see what her concerns are.  She no-showed to her follow-up appointment yesterday. See she went to the ER and vitals were stable.

## 2017-11-01 NOTE — Telephone Encounter (Unsigned)
Copied from CRM [id number]. Topic: Quick Communication - See Telephone Encounter >> Nov 01, 2017  8:32 AM Waymon AmatoBurton, Donna F wrote: CRM for notification. See Telephone encounter for: pt is needing to talk with somone reagrding her blood pressure medication she believes that is causing still having headaches  Best number [phone]  11/01/17.

## 2017-11-04 ENCOUNTER — Ambulatory Visit: Payer: BLUE CROSS/BLUE SHIELD | Admitting: Physician Assistant

## 2017-11-04 ENCOUNTER — Other Ambulatory Visit: Payer: Self-pay

## 2017-11-04 ENCOUNTER — Telehealth: Payer: Self-pay | Admitting: Physician Assistant

## 2017-11-04 ENCOUNTER — Encounter: Payer: Self-pay | Admitting: Physician Assistant

## 2017-11-04 ENCOUNTER — Encounter: Payer: Self-pay | Admitting: Emergency Medicine

## 2017-11-04 VITALS — BP 140/98 | HR 84 | Temp 98.0°F | Resp 14 | Ht 67.0 in | Wt 235.0 lb

## 2017-11-04 DIAGNOSIS — G44229 Chronic tension-type headache, not intractable: Secondary | ICD-10-CM

## 2017-11-04 DIAGNOSIS — I10 Essential (primary) hypertension: Secondary | ICD-10-CM

## 2017-11-04 DIAGNOSIS — K219 Gastro-esophageal reflux disease without esophagitis: Secondary | ICD-10-CM

## 2017-11-04 DIAGNOSIS — R0600 Dyspnea, unspecified: Secondary | ICD-10-CM | POA: Diagnosis not present

## 2017-11-04 MED ORDER — PANTOPRAZOLE SODIUM 40 MG PO TBEC: 40.0000 mg | DELAYED_RELEASE_TABLET | Freq: Every day | ORAL | 3 refills | Status: DC

## 2017-11-04 MED ORDER — METHOCARBAMOL 500 MG PO TABS: 500.0000 mg | ORAL_TABLET | Freq: Three times a day (TID) | ORAL | 0 refills | Status: DC | PRN

## 2017-11-04 MED ORDER — AMLODIPINE BESYLATE 5 MG PO TABS: 5.0000 mg | ORAL_TABLET | Freq: Every day | ORAL | 3 refills | Status: DC

## 2017-11-04 NOTE — Telephone Encounter (Signed)
Received 'Concurrent Disability & Leave" paperwork for patient, via fax. Placed in front bin with charge sheet.

## 2017-11-04 NOTE — Patient Instructions (Signed)
Please stay well-hydrated. Stop the combination blood pressure medication. Start the plain Amlodipine.   Keep up with diet. Restart the Pantoprazole daily.   Start the Robaxin at low dose each evening and when needed for headache. See if this helps.  I am setting you up urgently with Neurology and we are also obtaining an echocardiogram. Please keep you phone on so these can be scheduled.  Follow-up with me in 1 week.   I am taking you out of work through 11/11/17. Please have work fax me FMLA.

## 2017-11-04 NOTE — Progress Notes (Signed)
Patient presents to clinic today for ER follow-up and reassessment of hypertension and chronic headaches. At last visit 10/25/17, patient was started on amlodipine-benazepril. Since that time patient endorses taking medications as directed. Noted some lightheadedness and dry cough with medication but continued to take. On 10/31/17 she was feeling dizzy and fatigued at work with mild chest pain. As such she was taken to the ER for assessment. ER workup at that time included unremarkable labs and EKG. BP stable at visit and patient remained asymptomatic during stay. Patient was discharged home to follow-up here. Patient notes stopping her BP medications. Endorses continued daily headache that is more tension and pulling in shoulders and posterior neck. Denies nausea/vomiting, photophobia or phonophobia.   Past Medical History:  Diagnosis Date  . Breast discharge    bil unknown  time   . GERD (gastroesophageal reflux disease)   . HA (headache)   . History of chicken pox   . Hypertension   . Migraine     Current Outpatient Medications on File Prior to Visit  Medication Sig Dispense Refill  . amLODipine-benazepril (LOTREL) 5-10 MG capsule Take 2 capsules by mouth daily. 30 capsule 1  . vitamin B-12 (CYANOCOBALAMIN) 500 MCG tablet Take 500 mcg by mouth daily.    . pantoprazole (PROTONIX) 20 MG tablet Take 20 mg by mouth daily.     No current facility-administered medications on file prior to visit.     Allergies  Allergen Reactions  . Levaquin [Levofloxacin In D5w] Diarrhea and Itching  . Vicodin [Hydrocodone-Acetaminophen] Nausea And Vomiting    Family History  Problem Relation Age of Onset  . Congestive Heart Failure Mother [id number]       Deceased  . Stroke Mother   . Diabetes Mother   . Stroke Brother        multiple   . Hypertension Brother        x3  . Hypertension Sister        x4  . Migraines Sister   . Diabetes Sister   . Diabetes Brother   . Diabetes Maternal Grandmother     . Breast cancer Maternal Aunt   . Heart attack Maternal Uncle   . Healthy Son        x1  . Healthy Daughter        x2    Social History   Socioeconomic History  . Marital status: Single    Spouse name: None  . Number of children: None  . Years of education: 2312  . Highest education level: None  Social Needs  . Financial resource strain: None  . Food insecurity - worry: None  . Food insecurity - inability: None  . Transportation needs - medical: None  . Transportation needs - non-medical: None  Occupational History  . None  Tobacco Use  . Smoking status: Never Smoker  . Smokeless tobacco: Never Used  Substance and Sexual Activity  . Alcohol use: Yes    Alcohol/week: 0.0 oz    Comment: rare   . Drug use: No  . Sexual activity: No    Partners: Male    Birth control/protection: None  Other Topics Concern  . None  Social History Narrative   Lives w/ sister   Caffeine use:  Tea and soda- 2-3 per day   Review of Systems - See HPI.  All other ROS are negative.  BP (!) 140/98   Pulse 84   Temp 98 F (36.7 C) (Oral)   Resp  14   Ht 5\' 7"  (1.702 m)   Wt 235 lb (106.6 kg)   LMP 10/24/2017   SpO2 99%   BMI 36.81 kg/m   Physical Exam  Constitutional: She is oriented to person, place, and time and well-developed, well-nourished, and in no distress.  HENT:  Head: Normocephalic and atraumatic.  Eyes: Conjunctivae and EOM are normal. Pupils are equal, round, and reactive to light.  Neck: Neck supple. Muscular tenderness present. No spinous process tenderness present. Normal range of motion present.  Cardiovascular: Normal rate, regular rhythm, normal heart sounds and intact distal pulses.  Pulmonary/Chest: Effort normal and breath sounds normal. No respiratory distress. She has no wheezes. She has no rales. She exhibits no tenderness.  Lymphadenopathy:    She has no cervical adenopathy.  Neurological: She is alert and oriented to person, place, and time.  Skin: Skin is  warm and dry. No rash noted.  Vitals reviewed.  Recent Results (from the past 2160 hour(s))  Urinalysis, Routine w reflex microscopic     Status: Abnormal   Collection Time: 10/31/17 11:58 PM  Result Value Ref Range   Color, Urine YELLOW YELLOW   APPearance CLEAR CLEAR   Specific Gravity, Urine 1.025 1.005 - 1.030   pH 6.5 5.0 - 8.0   Glucose, UA NEGATIVE NEGATIVE mg/dL   Hgb urine dipstick MODERATE (A) NEGATIVE   Bilirubin Urine NEGATIVE NEGATIVE   Ketones, ur NEGATIVE NEGATIVE mg/dL   Protein, ur NEGATIVE NEGATIVE mg/dL   Nitrite NEGATIVE NEGATIVE   Leukocytes, UA NEGATIVE NEGATIVE  Pregnancy, urine     Status: None   Collection Time: 10/31/17 11:58 PM  Result Value Ref Range   Preg Test, Ur NEGATIVE NEGATIVE    Comment:        THE SENSITIVITY OF THIS METHODOLOGY IS >20 mIU/mL.   Urinalysis, Microscopic (reflex)     Status: Abnormal   Collection Time: 10/31/17 11:58 PM  Result Value Ref Range   RBC / HPF 6-30 0 - 5 RBC/hpf   WBC, UA NONE SEEN 0 - 5 WBC/hpf   Bacteria, UA RARE (A) NONE SEEN   Squamous Epithelial / LPF 0-5 (A) NONE SEEN    Assessment/Plan: 1. Chronic tension-type headache, not intractable Previously set up with Neurology but no-showed to all appointments. Is willing to see specialist presently. Referral to Neurology placed. Will start trial of Robaxin along with stretching exercises.  - Ambulatory referral to Neurology - methocarbamol (ROBAXIN) 500 MG tablet; Take 1 tablet (500 mg total) by mouth every 8 (eight) hours as needed for muscle spasms.  Dispense: 30 tablet; Refill: 0  2. Hypertension, unspecified type Stop Amlodipine-benazepril. Start Amlodipine 5 mg. DASH diet. Close follow-up scheduled to recheck BP and adjust dose further. - amLODipine (NORVASC) 5 MG tablet; Take 1 tablet (5 mg total) by mouth daily.  Dispense: 90 tablet; Refill: 3  3. PND (paroxysmal nocturnal dyspnea) Echo ordered for further assessment. Patient is clinically euvolemic on  examination. - ECHOCARDIOGRAM COMPLETE; Future  4. Gastroesophageal reflux disease without esophagitis Restart Protonix. GERD diet reviewed. Exercise recommendations given to promote weight loss. - pantoprazole (PROTONIX) 40 MG tablet; Take 1 tablet (40 mg total) by mouth daily.  Dispense: 30 tablet; Refill: 3    Piedad Climes, [place]

## 2017-11-04 NOTE — Telephone Encounter (Signed)
FMLA paperwork in your box. Work note given out of work from 11/04/17-11/13/17.

## 2017-11-05 NOTE — Telephone Encounter (Signed)
Received FMLA paperwork. Will notify once complete and ready to fax.

## 2017-11-05 NOTE — Telephone Encounter (Signed)
Paperwork faxed to [employer].

## 2017-11-05 NOTE — Telephone Encounter (Signed)
FMLA complete. Given to CMA to fax. No charge -- patient had appointment.

## 2017-11-06 ENCOUNTER — Ambulatory Visit (HOSPITAL_COMMUNITY): Payer: BLUE CROSS/BLUE SHIELD | Attending: Cardiology

## 2017-11-06 ENCOUNTER — Ambulatory Visit: Payer: BLUE CROSS/BLUE SHIELD | Admitting: Physician Assistant

## 2017-11-06 ENCOUNTER — Other Ambulatory Visit: Payer: Self-pay

## 2017-11-06 DIAGNOSIS — R0600 Dyspnea, unspecified: Secondary | ICD-10-CM | POA: Diagnosis not present

## 2017-11-06 DIAGNOSIS — I10 Essential (primary) hypertension: Secondary | ICD-10-CM | POA: Diagnosis not present

## 2017-11-06 DIAGNOSIS — G43909 Migraine, unspecified, not intractable, without status migrainosus: Secondary | ICD-10-CM | POA: Insufficient documentation

## 2017-11-07 ENCOUNTER — Telehealth: Payer: Self-pay | Admitting: Physician Assistant

## 2017-11-07 NOTE — Telephone Encounter (Signed)
Received FMLA paperwork for pt, via fax. Placed in front bin with charge sheet.

## 2017-11-07 NOTE — Telephone Encounter (Signed)
In your folder for completion.

## 2017-11-08 NOTE — Telephone Encounter (Signed)
Received paperwork. However I just completed and sent in paperwork 3 days ago. [facility] to see if something else is needed?

## 2017-11-11 ENCOUNTER — Ambulatory Visit: Payer: BLUE CROSS/BLUE SHIELD | Admitting: Physician Assistant

## 2017-11-12 NOTE — Telephone Encounter (Signed)
Called [employer] about updated paperwork. Completed and faxed paperwork for patient on 11/05/17. Patient was given a work note excusing her from 11/04/17-11/13/17. Patient has pending appt on 11/15/17 for follow up. Will hold on to paperwork until appt on 11/15/17

## 2017-11-12 NOTE — Telephone Encounter (Signed)
Received signed domestic return receipt verifying delivery of certified letter on November 04, 2017. Article number [id number] daj

## 2017-11-13 ENCOUNTER — Encounter: Payer: Self-pay | Admitting: Physician Assistant

## 2017-11-13 ENCOUNTER — Ambulatory Visit: Payer: Self-pay

## 2017-11-13 ENCOUNTER — Ambulatory Visit: Payer: BLUE CROSS/BLUE SHIELD | Admitting: Physician Assistant

## 2017-11-13 ENCOUNTER — Other Ambulatory Visit: Payer: Self-pay

## 2017-11-13 VITALS — BP 140/68 | HR 83 | Temp 98.1°F | Resp 16 | Ht 67.0 in | Wt 236.0 lb

## 2017-11-13 DIAGNOSIS — J069 Acute upper respiratory infection, unspecified: Secondary | ICD-10-CM | POA: Diagnosis not present

## 2017-11-13 DIAGNOSIS — B9789 Other viral agents as the cause of diseases classified elsewhere: Secondary | ICD-10-CM | POA: Diagnosis not present

## 2017-11-13 NOTE — Patient Instructions (Signed)
Please stay well-hydrated and get plenty of rest. Start the [facility] for cough in addition to plain Mucinex for congestion. Use the Flonase as directed. Start a Zinc or Echinacea supplement during this illness.  Symptoms will take up to 7-10 days to completely resolve.   As long as you are fever free, you are welcome to go to work tomorrow.

## 2017-11-13 NOTE — Progress Notes (Signed)
Patient presents to clinic today c/o 3-4 days of aches, and cough that is sometimes productive of clear phlegm. Notes nasal congestion and clear rhinorrhea. Notes some wheezing at night and some chest tenderness with coughing. Is hydrating well and trying to eat well. Notes some fatigue. Denies nausea/diarrhea or rash. Notes bilateral maxillary sinus pain.  Past Medical History:  Diagnosis Date  . Breast discharge    bil unknown  time   . GERD (gastroesophageal reflux disease)   . HA (headache)   . History of chicken pox   . Hypertension   . Migraine     Current Outpatient Medications on File Prior to Visit  Medication Sig Dispense Refill  . vitamin B-12 (CYANOCOBALAMIN) 500 MCG tablet Take 500 mcg by mouth daily.    Marland Kitchen amLODipine (NORVASC) 5 MG tablet Take 1 tablet (5 mg total) by mouth daily. (Patient not taking: Reported on 11/13/2017) 90 tablet 3  . methocarbamol (ROBAXIN) 500 MG tablet Take 1 tablet (500 mg total) by mouth every 8 (eight) hours as needed for muscle spasms. (Patient not taking: Reported on 11/13/2017) 30 tablet 0  . pantoprazole (PROTONIX) 40 MG tablet Take 1 tablet (40 mg total) by mouth daily. (Patient not taking: Reported on 11/13/2017) 30 tablet 3   No current facility-administered medications on file prior to visit.     Allergies  Allergen Reactions  . Levaquin [Levofloxacin In D5w] Diarrhea and Itching  . Vicodin [Hydrocodone-Acetaminophen] Nausea And Vomiting    Family History  Problem Relation Age of Onset  . Congestive Heart Failure Mother 46       Deceased  . Stroke Mother   . Diabetes Mother   . Stroke Brother        multiple   . Hypertension Brother        x3  . Hypertension Sister        x4  . Migraines Sister   . Diabetes Sister   . Diabetes Brother   . Diabetes Maternal Grandmother   . Breast cancer Maternal Aunt   . Heart attack Maternal Uncle   . Healthy Son        x1  . Healthy Daughter        x2    Social History    Socioeconomic History  . Marital status: Single    Spouse name: None  . Number of children: None  . Years of education: 1  . Highest education level: None  Social Needs  . Financial resource strain: None  . Food insecurity - worry: None  . Food insecurity - inability: None  . Transportation needs - medical: None  . Transportation needs - non-medical: None  Occupational History  . None  Tobacco Use  . Smoking status: Never Smoker  . Smokeless tobacco: Never Used  Substance and Sexual Activity  . Alcohol use: Yes    Alcohol/week: 0.0 oz    Comment: rare   . Drug use: No  . Sexual activity: No    Partners: Male    Birth control/protection: None  Other Topics Concern  . None  Social History Narrative   Lives w/ sister   Caffeine use:  Tea and soda- 2-3 per day   Review of Systems - See HPI.  All other ROS are negative.  BP 140/68   Pulse 83   Temp 98.1 F (36.7 C) (Oral)   Resp 16   Ht 5\' 7"  (1.702 m)   Wt 236 lb (107 kg)  LMP 10/24/2017   SpO2 99%   BMI 36.96 kg/m   Physical Exam  Constitutional: She is oriented to person, place, and time and well-developed, well-nourished, and in no distress.  HENT:  Head: Normocephalic and atraumatic.  Right Ear: Tympanic membrane normal.  Left Ear: Tympanic membrane normal.  Nose: Nose normal. Right sinus exhibits no maxillary sinus tenderness and no frontal sinus tenderness. Left sinus exhibits no maxillary sinus tenderness and no frontal sinus tenderness.  Mouth/Throat: Uvula is midline, oropharynx is clear and moist and mucous membranes are normal. No oropharyngeal exudate.  Eyes: Conjunctivae are normal.  Neck: Neck supple.  Cardiovascular: Normal rate, regular rhythm, normal heart sounds and intact distal pulses.  Pulmonary/Chest: Effort normal and breath sounds normal. No respiratory distress. She has no wheezes. She has no rales. She exhibits no tenderness.  Neurological: She is alert and oriented to person,  place, and time.  Skin: Skin is warm and dry. No rash noted.  Psychiatric: Affect normal.  Vitals reviewed.  Recent Results (from the past 2160 hour(s))  Urinalysis, Routine w reflex microscopic     Status: Abnormal   Collection Time: 10/31/17 11:58 PM  Result Value Ref Range   Color, Urine YELLOW YELLOW   APPearance CLEAR CLEAR   Specific Gravity, Urine 1.025 1.005 - 1.030   pH 6.5 5.0 - 8.0   Glucose, UA NEGATIVE NEGATIVE mg/dL   Hgb urine dipstick MODERATE (A) NEGATIVE   Bilirubin Urine NEGATIVE NEGATIVE   Ketones, ur NEGATIVE NEGATIVE mg/dL   Protein, ur NEGATIVE NEGATIVE mg/dL   Nitrite NEGATIVE NEGATIVE   Leukocytes, UA NEGATIVE NEGATIVE  Pregnancy, urine     Status: None   Collection Time: 10/31/17 11:58 PM  Result Value Ref Range   Preg Test, Ur NEGATIVE NEGATIVE    Comment:        THE SENSITIVITY OF THIS METHODOLOGY IS >20 mIU/mL.   Urinalysis, Microscopic (reflex)     Status: Abnormal   Collection Time: 10/31/17 11:58 PM  Result Value Ref Range   RBC / HPF 6-30 0 - 5 RBC/hpf   WBC, UA NONE SEEN 0 - 5 WBC/hpf   Bacteria, UA RARE (A) NONE SEEN   Squamous Epithelial / LPF 0-5 (A) NONE SEEN    Assessment/Plan: 1. Viral URI with cough Start supportive measures. OTC medications reviewed. Restart Flonase daily along with Tessalon for cough. Follow-up if not resolving.    Piedad ClimesWilliam Cody Desmin Daleo, PA-C

## 2017-11-13 NOTE — Telephone Encounter (Signed)
Patient called in with c/o "coughing x 3 days, body aches."  She says "the cough is bad, my body aches, no fever. I hear some wheezing, but not bad. My breathing is fine sitting up, but when I lay down is when it's worse." I asked is she coughing up anything, she says "yes, clear." Her other symptoms are runny nose, body aches, wheezing. She denies CP today, but says "it hurt a little the other day." According to protocol, see PCP within 24 hours, appointment made today at 1500 with Malva Coganody Martin, PA-C, care advice given, patient verbalized understanding.   Reason for Disposition . [1] Continuous (nonstop) coughing interferes with work or school AND [2] no improvement using cough treatment per Care Advice  Answer Assessment - Initial Assessment Questions 1. ONSET: "When did the cough begin?"      3 days ago 2. SEVERITY: "How bad is the cough today?"      terrible 3. RESPIRATORY DISTRESS: "Describe your breathing."      Ok when up, but laying down is not too good, have to get up in the middle of night 4. FEVER: "Do you have a fever?" If so, ask: "What is your temperature, how was it measured, and when did it start?"     No 5. SPUTUM: "Describe the color of your sputum" (clear, white, yellow, green)     Clear 6. HEMOPTYSIS: "Are you coughing up any blood?" If so ask: "How much?" (flecks, streaks, tablespoons, etc.)     No 7. CARDIAC HISTORY: "Do you have any history of heart disease?" (e.g., heart attack, congestive heart failure)      No 8. LUNG HISTORY: "Do you have any history of lung disease?"  (e.g., pulmonary embolus, asthma, emphysema)     No 9. PE RISK FACTORS: "Do you have a history of blood clots?" (or: recent major surgery, recent prolonged travel, bedridden )     No 10. OTHER SYMPTOMS: "Do you have any other symptoms?" (e.g., runny nose, wheezing, chest pain)       Wheezing, body aches, runny nose, no chest pain today-had a little the other day 11. PREGNANCY: "Is there any chance  you are pregnant?" "When was your last menstrual period?"       No 12. TRAVEL: "Have you traveled out of the country in the last month?" (e.g., travel history, exposures)       No  Protocols used: COUGH - ACUTE PRODUCTIVE-A-AH

## 2017-11-14 MED ORDER — BENZONATATE 100 MG PO CAPS: 100.0000 mg | ORAL_CAPSULE | Freq: Two times a day (BID) | ORAL | 0 refills | Status: DC | PRN

## 2017-11-14 MED ORDER — FLUTICASONE PROPIONATE 50 MCG/ACT NA SUSP: 2.0000 | Freq: Every day | NASAL | 6 refills | Status: DC

## 2017-11-15 ENCOUNTER — Encounter: Payer: Self-pay | Admitting: Physician Assistant

## 2017-11-15 ENCOUNTER — Other Ambulatory Visit: Payer: Self-pay

## 2017-11-15 ENCOUNTER — Ambulatory Visit: Payer: BLUE CROSS/BLUE SHIELD | Admitting: Physician Assistant

## 2017-11-15 ENCOUNTER — Telehealth: Payer: Self-pay | Admitting: Physician Assistant

## 2017-11-15 DIAGNOSIS — I10 Essential (primary) hypertension: Secondary | ICD-10-CM

## 2017-11-15 NOTE — Telephone Encounter (Signed)
Received "Certification of Health Care Provider for Employee Serious Health Condition" paperwork. Place in front bin with charge sheet.

## 2017-11-15 NOTE — Progress Notes (Signed)
Patient presents to clinic today for hypertension follow-up. Patient endorses starting amlodipine last night. Is so far tolerating without side effects. Was supposed to start last week but states she did not pick up until yesterday. Patient denies chest pain, palpitations, lightheadedness, dizziness, vision changes or frequent headaches.  BP Readings from Last 3 Encounters:  11/15/17 (!) 138/98  11/13/17 140/68  11/04/17 (!) 140/98   Past Medical History:  Diagnosis Date  . Breast discharge    bil unknown  time   . GERD (gastroesophageal reflux disease)   . HA (headache)   . History of chicken pox   . Hypertension   . Migraine     Current Outpatient Medications on File Prior to Visit  Medication Sig Dispense Refill  . benzonatate (TESSALON) 100 MG capsule Take 1 capsule (100 mg total) by mouth 2 (two) times daily as needed for cough. 20 capsule 0  . fluticasone (FLONASE) 50 MCG/ACT nasal spray Place 2 sprays into both nostrils daily. 16 g 6  . vitamin B-12 (CYANOCOBALAMIN) 500 MCG tablet Take 500 mcg by mouth daily.     No current facility-administered medications on file prior to visit.     Allergies  Allergen Reactions  . Levaquin [Levofloxacin In D5w] Diarrhea and Itching  . Vicodin [Hydrocodone-Acetaminophen] Nausea And Vomiting    Family History  Problem Relation Age of Onset  . Congestive Heart Failure Mother 6863       Deceased  . Stroke Mother   . Diabetes Mother   . Stroke Brother        multiple   . Hypertension Brother        x3  . Hypertension Sister        x4  . Migraines Sister   . Diabetes Sister   . Diabetes Brother   . Diabetes Maternal Grandmother   . Breast cancer Maternal Aunt   . Heart attack Maternal Uncle   . Healthy Son        x1  . Healthy Daughter        x2    Social History   Socioeconomic History  . Marital status: Single    Spouse name: None  . Number of children: None  . Years of education: 8512  . Highest education level:  None  Social Needs  . Financial resource strain: None  . Food insecurity - worry: None  . Food insecurity - inability: None  . Transportation needs - medical: None  . Transportation needs - non-medical: None  Occupational History  . None  Tobacco Use  . Smoking status: Never Smoker  . Smokeless tobacco: Never Used  Substance and Sexual Activity  . Alcohol use: Yes    Alcohol/week: 0.0 oz    Comment: rare   . Drug use: No  . Sexual activity: No    Partners: Male    Birth control/protection: None  Other Topics Concern  . None  Social History Narrative   Lives w/ sister   Caffeine use:  Tea and soda- 2-3 per day   Review of Systems - See HPI.  All other ROS are negative.  BP (!) 138/98   Pulse 82   Temp 98.2 F (36.8 C) (Oral)   Resp 14   Ht 5\' 7"  (1.702 m)   Wt 235 lb (106.6 kg)   LMP 10/24/2017   SpO2 99%   BMI 36.81 kg/m   Physical Exam  Constitutional: She is oriented to person, place, and time and well-developed,  well-nourished, and in no distress.  HENT:  Head: Normocephalic and atraumatic.  Eyes: Conjunctivae are normal.  Neck: Neck supple.  Cardiovascular: Normal rate, regular rhythm, normal heart sounds and intact distal pulses.  Pulmonary/Chest: Effort normal and breath sounds normal. No respiratory distress. She has no wheezes. She has no rales. She exhibits no tenderness.  Neurological: She is alert and oriented to person, place, and time.  Skin: Skin is warm and dry. No rash noted.  Psychiatric: Affect normal.  Vitals reviewed.  Recent Results (from the past 2160 hour(s))  Urinalysis, Routine w reflex microscopic     Status: Abnormal   Collection Time: 10/31/17 11:58 PM  Result Value Ref Range   Color, Urine YELLOW YELLOW   APPearance CLEAR CLEAR   Specific Gravity, Urine 1.025 1.005 - 1.030   pH 6.5 5.0 - 8.0   Glucose, UA NEGATIVE NEGATIVE mg/dL   Hgb urine dipstick MODERATE (A) NEGATIVE   Bilirubin Urine NEGATIVE NEGATIVE   Ketones, ur  NEGATIVE NEGATIVE mg/dL   Protein, ur NEGATIVE NEGATIVE mg/dL   Nitrite NEGATIVE NEGATIVE   Leukocytes, UA NEGATIVE NEGATIVE  Pregnancy, urine     Status: None   Collection Time: 10/31/17 11:58 PM  Result Value Ref Range   Preg Test, Ur NEGATIVE NEGATIVE    Comment:        THE SENSITIVITY OF THIS METHODOLOGY IS >20 mIU/mL.   Urinalysis, Microscopic (reflex)     Status: Abnormal   Collection Time: 10/31/17 11:58 PM  Result Value Ref Range   RBC / HPF 6-30 0 - 5 RBC/hpf   WBC, UA NONE SEEN 0 - 5 WBC/hpf   Bacteria, UA RARE (A) NONE SEEN   Squamous Epithelial / LPF 0-5 (A) NONE SEEN   Assessment/Plan: Hypertension Just started medication last night.  Too soon to see improvement. Continue as directed. DASH diet. Check home BP daily and record. Call in 1 week with results. Will make further adjustments and schedule follow-up from there.     Piedad Climes, PA-C

## 2017-11-15 NOTE — Telephone Encounter (Signed)
Already filled out. This is duplicate. Shredded.

## 2017-11-15 NOTE — Assessment & Plan Note (Signed)
Just started medication last night.  Too soon to see improvement. Continue as directed. DASH diet. Check home BP daily and record. Call in 1 week with results. Will make further adjustments and schedule follow-up from there.

## 2017-11-15 NOTE — Patient Instructions (Signed)
Please continue the amlodipine as directed.  Limit salt intake.   Check BP measurements daily and record. Call me in 1 week with these numbers so we can make changes.    DASH Eating Plan DASH stands for "Dietary Approaches to Stop Hypertension." The DASH eating plan is a healthy eating plan that has been shown to reduce high blood pressure (hypertension). It may also reduce your risk for type 2 diabetes, heart disease, and stroke. The DASH eating plan may also help with weight loss. What are tips for following this plan? General guidelines  Avoid eating more than 2,300 mg (milligrams) of salt (sodium) a day. If you have hypertension, you may need to reduce your sodium intake to 1,500 mg a day.  Limit alcohol intake to no more than 1 drink a day for nonpregnant women and 2 drinks a day for men. One drink equals 12 oz of beer, 5 oz of wine, or 1 oz of hard liquor.  Work with your health care provider to maintain a healthy body weight or to lose weight. Ask what an ideal weight is for you.  Get at least 30 minutes of exercise that causes your heart to beat faster (aerobic exercise) most days of the week. Activities may include walking, swimming, or biking.  Work with your health care provider or diet and nutrition specialist (dietitian) to adjust your eating plan to your individual calorie needs. Reading food labels  Check food labels for the amount of sodium per serving. Choose foods with less than 5 percent of the Daily Value of sodium. Generally, foods with less than 300 mg of sodium per serving fit into this eating plan.  To find whole grains, look for the word "whole" as the first word in the ingredient list. Shopping  Buy products labeled as "low-sodium" or "no salt added."  Buy fresh foods. Avoid canned foods and premade or frozen meals. Cooking  Avoid adding salt when cooking. Use salt-free seasonings or herbs instead of table salt or sea salt. Check with your health care  provider or pharmacist before using salt substitutes.  Do not fry foods. Cook foods using healthy methods such as baking, boiling, grilling, and broiling instead.  Cook with heart-healthy oils, such as olive, canola, soybean, or sunflower oil. Meal planning   Eat a balanced diet that includes: ? 5 or more servings of fruits and vegetables each day. At each meal, try to fill half of your plate with fruits and vegetables. ? Up to 6-8 servings of whole grains each day. ? Less than 6 oz of lean meat, poultry, or fish each day. A 3-oz serving of meat is about the same size as a deck of cards. One egg equals 1 oz. ? 2 servings of low-fat dairy each day. ? A serving of nuts, seeds, or beans 5 times each week. ? Heart-healthy fats. Healthy fats called Omega-3 fatty acids are found in foods such as flaxseeds and coldwater fish, like sardines, salmon, and mackerel.  Limit how much you eat of the following: ? Canned or prepackaged foods. ? Food that is high in trans fat, such as fried foods. ? Food that is high in saturated fat, such as fatty meat. ? Sweets, desserts, sugary drinks, and other foods with added sugar. ? Full-fat dairy products.  Do not salt foods before eating.  Try to eat at least 2 vegetarian meals each week.  Eat more home-cooked food and less restaurant, buffet, and fast food.  When eating at a  restaurant, ask that your food be prepared with less salt or no salt, if possible. What foods are recommended? The items listed may not be a complete list. Talk with your dietitian about what dietary choices are best for you. Grains Whole-grain or whole-wheat bread. Whole-grain or whole-wheat pasta. Brown rice. [place]. Bulgur. Whole-grain and low-sodium cereals. Pita bread. Low-fat, low-sodium crackers. Whole-wheat flour tortillas. Vegetables Fresh or frozen vegetables (raw, steamed, roasted, or grilled). Low-sodium or reduced-sodium tomato and vegetable juice. Low-sodium or  reduced-sodium tomato sauce and tomato paste. Low-sodium or reduced-sodium canned vegetables. Fruits All fresh, dried, or frozen fruit. Canned fruit in natural juice (without added sugar). Meat and other protein foods Skinless chicken or [place]. Ground chicken or [place]. Pork with fat trimmed off. Fish and seafood. Egg whites. Dried beans, peas, or lentils. Unsalted nuts, nut butters, and seeds. Unsalted canned beans. Lean cuts of beef with fat trimmed off. Low-sodium, lean deli meat. Dairy Low-fat (1%) or fat-free (skim) milk. Fat-free, low-fat, or reduced-fat cheeses. Nonfat, low-sodium ricotta or cottage cheese. Low-fat or nonfat yogurt. Low-fat, low-sodium cheese. Fats and oils Soft margarine without trans fats. Vegetable oil. Low-fat, reduced-fat, or light mayonnaise and salad dressings (reduced-sodium). Canola, safflower, olive, soybean, and sunflower oils. Avocado. Seasoning and other foods Herbs. Spices. Seasoning mixes without salt. Unsalted popcorn and pretzels. Fat-free sweets. What foods are not recommended? The items listed may not be a complete list. Talk with your dietitian about what dietary choices are best for you. Grains Baked goods made with fat, such as croissants, muffins, or some breads. Dry pasta or rice meal packs. Vegetables Creamed or fried vegetables. Vegetables in a cheese sauce. Regular canned vegetables (not low-sodium or reduced-sodium). Regular canned tomato sauce and paste (not low-sodium or reduced-sodium). Regular tomato and vegetable juice (not low-sodium or reduced-sodium). Angie Fava. Olives. Fruits Canned fruit in a light or heavy syrup. Fried fruit. Fruit in cream or butter sauce. Meat and other protein foods Fatty cuts of meat. Ribs. Fried meat. Berniece Salines. Sausage. Bologna and other processed lunch meats. Salami. Fatback. Hotdogs. Bratwurst. Salted nuts and seeds. Canned beans with added salt. Canned or smoked fish. Whole eggs or egg yolks. Chicken or [place]  with skin. Dairy Whole or 2% milk, cream, and half-and-half. Whole or full-fat cream cheese. Whole-fat or sweetened yogurt. Full-fat cheese. Nondairy creamers. Whipped toppings. Processed cheese and cheese spreads. Fats and oils Butter. Stick margarine. Lard. Shortening. Ghee. Bacon fat. Tropical oils, such as coconut, palm kernel, or palm oil. Seasoning and other foods Salted popcorn and pretzels. Onion salt, garlic salt, seasoned salt, table salt, and sea salt. Worcestershire sauce. Tartar sauce. Barbecue sauce. Teriyaki sauce. Soy sauce, including reduced-sodium. Steak sauce. Canned and packaged gravies. Fish sauce. Oyster sauce. Cocktail sauce. Horseradish that you find on the shelf. Ketchup. Mustard. Meat flavorings and tenderizers. Bouillon cubes. Hot sauce and Tabasco sauce. Premade or packaged marinades. Premade or packaged taco seasonings. Relishes. Regular salad dressings. Where to find more information:  National Heart, Lung, and [place]: https://wilson-eaton.com/  American Heart Association: www.heart.org Summary  The DASH eating plan is a healthy eating plan that has been shown to reduce high blood pressure (hypertension). It may also reduce your risk for type 2 diabetes, heart disease, and stroke.  With the DASH eating plan, you should limit salt (sodium) intake to 2,300 mg a day. If you have hypertension, you may need to reduce your sodium intake to 1,500 mg a day.  When on the DASH eating plan, aim to eat more  fresh fruits and vegetables, whole grains, lean proteins, low-fat dairy, and heart-healthy fats.  Work with your health care provider or diet and nutrition specialist (dietitian) to adjust your eating plan to your individual calorie needs. This information is not intended to replace advice given to you by your health care provider. Make sure you discuss any questions you have with your health care provider. Document Released: 09/06/2011 Document Revised: 09/10/2016  Document Reviewed: 09/10/2016 Elsevier Interactive Patient Education  [employer].

## 2017-11-18 ENCOUNTER — Telehealth: Payer: Self-pay | Admitting: Physician Assistant

## 2017-11-18 DIAGNOSIS — Z0279 Encounter for issue of other medical certificate: Secondary | ICD-10-CM

## 2017-11-18 NOTE — Telephone Encounter (Signed)
LMOVM advising patient that [employer] finally sent the correct FMLA paperwork over. PCP is completing today and will fax back today. We have not receive any forms for FMLA every forms have been Disability from [employer].

## 2017-11-18 NOTE — Telephone Encounter (Signed)
Copied from CRM [phone]. Topic: Inquiry >> Nov 15, 2017  3:22 PM Arlyss Gandyichardson, Taren N, NT wrote: Reason for CRM: Pt wanting to see if office received her paperwork via fax for her FMLA. Please contact pt  >> Nov 16, 2017 12:12 PM Con MemosMoore, Patina S, CMA wrote: Received FMLA paperwork. PCP has to complete. >> Nov 18, 2017  9:57 AM Rudi CocoLathan, Antero Derosia M, NT wrote: Pt calling to see if FMLA paperwork is filled out has to be done today would like for some to give her call being that she needs to sign papers also. Pt. Can be reached at [phone]

## 2018-01-07 ENCOUNTER — Ambulatory Visit (INDEPENDENT_AMBULATORY_CARE_PROVIDER_SITE_OTHER): Payer: BLUE CROSS/BLUE SHIELD | Admitting: Neurology

## 2018-01-07 ENCOUNTER — Encounter: Payer: Self-pay | Admitting: Neurology

## 2018-01-07 VITALS — BP 171/98 | HR 73 | Ht 67.0 in | Wt 237.0 lb

## 2018-01-07 DIAGNOSIS — G43711 Chronic migraine without aura, intractable, with status migrainosus: Secondary | ICD-10-CM | POA: Diagnosis not present

## 2018-01-07 MED ORDER — FREMANEZUMAB-VFRM 225 MG/1.5ML ~~LOC~~ SOSY: 225.0000 mg | PREFILLED_SYRINGE | SUBCUTANEOUS | 11 refills | Status: AC

## 2018-01-07 MED ORDER — FREMANEZUMAB-VFRM 225 MG/1.5ML ~~LOC~~ SOSY: 225.0000 mg | PREFILLED_SYRINGE | SUBCUTANEOUS | 0 refills | Status: AC

## 2018-01-07 NOTE — Progress Notes (Signed)
Discussed Ajovy injection with the patient and gave them instruction sheets from the box along with a copay card. The patient is aware that injection is subcutaneous and should be given every 30 days. Syringes should remain refrigerated until ready for use. Let the syringe sit at room temperature for 30 minutes prior to injection. The patient was also educated on the available sites to use each month (abdomen, thigh, and back of upper arm). I discussed proper technique for skin prep prior to injection including to wash hands, cleanse the site inside to outside in a circular motion and allow skin to air dry. The patient was instructed how to pinch the skin, inject the syringe at a 45-90 degree angle, and inject all of the medication. Immediately place the used syringe in the sharps container and do not recap. RN administered medication today in the R posterior upper arm. The patient tolerated well and verbalized understanding of instructions.

## 2018-01-07 NOTE — Patient Instructions (Signed)
Fremanezumab: Patient drug information Access [employer] Online here. Copyright 1978-2019 [employer] All rights reserved. (For additional information see "Fremanezumab: Drug information") Brand Names: [place]  Ajovy  What is this drug used for?   It is used to prevent migraine headaches.  What do I need to tell my doctor BEFORE I take this drug?   If you have an allergy to this drug or any part of this drug.   If you are allergic to any drugs like this one, any other drugs, foods, or other substances. Tell your doctor about the allergy and what signs you had, like rash; hives; itching; shortness of breath; wheezing; cough; swelling of face, lips, tongue, or throat; or any other signs.   This drug may interact with other drugs or health problems.   Tell your doctor and pharmacist about all of your drugs (prescription or OTC, natural products, vitamins) and health problems. You must check to make sure that it is safe for you to take this drug with all of your drugs and health problems. Do not start, stop, or change the dose of any drug without checking with your doctor.  What are some things I need to know or do while I take this drug?   Tell all of your health care providers that you take this drug. This includes your doctors, nurses, pharmacists, and dentists.   Allergic reactions have happened within hours and up to 1 month after this drug was given. Tell your doctor right away about any bad effects after getting this drug.   Tell your doctor if you are pregnant or plan on getting pregnant. You will need to talk about the benefits and risks of using this drug while you are pregnant.   Tell your doctor if you are breast-feeding. You will need to talk about any risks to your baby.  What are some side effects that I need to call my doctor about right away?   WARNING/CAUTION: Even though it may be rare, some people may have very bad and sometimes deadly side effects when taking a drug. Tell your  doctor or get medical help right away if you have any of the following signs or symptoms that may be related to a very bad side effect:   Signs of an allergic reaction, like rash; hives; itching; red, swollen, blistered, or peeling skin with or without fever; wheezing; tightness in the chest or throat; trouble breathing, swallowing, or talking; unusual hoarseness; or swelling of the mouth, face, lips, tongue, or throat.   Very bad irritation where the shot was given.  What are some other side effects of this drug?   All drugs may cause side effects. However, many people have no side effects or only have minor side effects. Call your doctor or get medical help if any of these side effects or any other side effects bother you or do not go away:   Irritation where the shot is given.   These are not all of the side effects that may occur. If you have questions about side effects, call your doctor. Call your doctor for medical advice about side effects.   You may report side effects to your national health agency.  How is this drug best taken?   Use this drug as ordered by your doctor. Read all information given to you. Follow all instructions closely.   It is given as a shot into the fatty part of the skin on the top of the thigh, belly area,   or upper arm.   If you will be giving yourself the shot, your doctor or nurse will teach you how to give the shot.   Follow how to use as you have been told by the doctor or read the package insert.   Wash your hands before use.   If stored in a refrigerator, let this drug come to room temperature before using it. Leave it at room temperature for at least 30 minutes. Do not heat this drug.   Do not shake.   Do not give into skin that is irritated, bruised, red, infected, or scarred.   Do not give into the same place as another shot.   If your dose is more than 1 injection, you may give it in the same body part. Make sure you do not give injections in the same  spot you used for the other injections.   Do not use if the solution is cloudy, leaking, or has particles.   This drug is colorless to a faint yellow. Do not use if the solution changes color.   Each prefilled syringe is for one use only.   Throw away needles in a needle/sharp disposal box. Do not reuse needles or other items. When the box is full, follow all local rules for getting rid of it. Talk with a doctor or pharmacist if you have any questions.  What do I do if I miss a dose?   Take a missed dose as soon as you think about it.   After taking a missed dose, start a new schedule based on when the dose is taken.  How do I store and/or throw out this drug?   Store in a refrigerator. Do not freeze.   Store in the carton to protect from light.   If needed, this drug can be left out at room temperature for up to 24 hours. Throw away drug if left at room temperature for more than 24 hours.   Protect from heat and sunlight.   Keep all drugs in a safe place. Keep all drugs out of the reach of children and pets.   Throw away unused or expired drugs. Do not flush down a toilet or pour down a drain unless you are told to do so. Check with your pharmacist if you have questions about the best way to throw out drugs. There may be drug take-back programs in your area.  General drug facts   If your symptoms or health problems do not get better or if they become worse, call your doctor.   Do not share your drugs with others and do not take anyone else's drugs.   Keep a list of all your drugs (prescription, natural products, vitamins, OTC) with you. Give this list to your doctor.   Talk with the doctor before starting any new drug, including prescription or OTC, natural products, or vitamins.   Some drugs may have another patient information leaflet. If you have any questions about this drug, please talk with your doctor, nurse, pharmacist, or other health care provider.   If you think there has been an  overdose, call your poison control center or get medical care right away. Be ready to tell or show what was taken, how much, and when it happened.   

## 2018-01-07 NOTE — Progress Notes (Signed)
[facility]    Provider:  Dr Lucia GaskinsAhern Referring Provider: Noel JourneyMartin, William C, PA-C Primary Care Physician:  Patient, No Pcp Per  CC:  Headache follow up  Interval history: Her headaches are the same. She couldn't sit up. Felt like someone was punching her in her head all day. Unilateral but can shift sides. Pounding and throbbing. Sister also with intractable migraines. 20 days of migraines headaches a month. She has lightheaded, dizzy, blood pressure shoots up, nausea, no vomiting, no vision changes. Blurry vision not often. Sometimes she will see starts beforehand. Movement makes it worse. Can last 4-72 hours usually all day. No medication overuse.   Medications tried: Topamax, amlodipine, reglan, zofran, amitriptyline, fioricet, flexeril, tegretol, robaxin, propranolol, trazodone  HPI:  Kim Shellingicole Hartje is a 47 y.o. female here as a referral from Dr. Daphine DeutscherMartin for headaches. Here with her partner. Started last Sept or October. No inciting events or head trauma. Sister has migraines. She feels like someone is pounding her in her head. Unilateral but shifts sides, her neck and shoulder hurt. She gets lightheaded, dizzy, nauseated, her left side is always numb and heavy and weak.The left sided numbness/weakness started last Sept or October and may not be associated with headaches. The headaches can be severe. 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  She sees spots and lights sometimes but not always before the headaches. They are pounding. No light sensitivity or sound, she has nausea and sometimes vomits with the headaches. She "jumps" all night, wakes her up. She snores loud and wakes up with headaches. She is tired during the day. She is off balance a lot.Headaches are every day for up to all day. She sometimes tried fioricet but it doesn't help. No side effects to the Topamax. No other focal neurologic deficits, signs or symptoms. Discussed stop the Fioricet as it can  cause rebound headaches. Discussed stopping the Fioricet as a cause rebound headaches.  Reviewed notes, labs and imaging from outside physicians, which showed: Personally reviewed images and agree with the following: MRI of the brain October 2016: There is no evidence of acute infarct, intracranial hemorrhage, mass, midline shift, or extra-axial fluid collection. Ventricles and sulci are normal. Minimal scattered, punctate foci of T2 hyperintensity in the subcortical cerebral white matter do not appear significantly changed in number although were overall more conspicuous on the prior study. No periventricular, corpus callosum, brainstem, or cerebellar lesions are seen. No abnormal enhancement is seen.  Orbits are unremarkable. Paranasal sinuses and mastoid air cells are clear. Major intracranial vascular flow voids are preserved.  IMPRESSION: 1. No acute intracranial abnormality or mass. 2. Minimal nonspecific cerebral white matter changes.   Patient reports headache started last October. Reviewed records in Epic starting at that time. In November 2016 patient was having frequent headaches and neck pain into the upper extremities with tingling and numbness. 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 MRI of the cervical spine showed a bulging disc at C5-C6  and she was referred to neurosurgery. Patient was seen again in May of this year complaining of migraine headaches. Associated symptoms nausea, photophobia. No fever and no chills no vomiting or dizziness. She is status post laminectomy with improvement in back pain and resolution of numbness but headaches persisted. She was started on tizanidine by a neurosurgeon but couldn't tolerate it. She was started on Topamax for preventative management and Fioricet for acute management. And she was referred to neurology.  Review of Systems: Patient complains of symptoms per HPI as well as the following symptoms:  Blurred vision, easy bruising, shortness of breath, snoring, feeling hot, increased thirst, headache, numbness, weakness, dizziness, snoring, shift 4, restless legs, not enough sleep, decreased energy. Pertinent negatives per HPI. All others negative. Patient was started on Topamax for prevention. Fioricet for by mouth abortive therapy at home. Referral to headache clinic was placed.   Social History   Socioeconomic History  . Marital status: Single    Spouse name: Not on file  . Number of children: Not on file  . Years of education: 20  . Highest education level: Not on file  Occupational History  . Not on file  Social Needs  . Financial resource strain: Not on file  . Food insecurity:    Worry: Not on file    Inability: Not on file  . Transportation needs:    Medical: Not on file    Non-medical: Not on file  Tobacco Use  . Smoking status: Never Smoker  . Smokeless tobacco: Never Used  Substance and Sexual Activity  . Alcohol use: Yes    Alcohol/week: 0.0 oz    Comment: rare   . Drug use: No  . Sexual activity: Never    Partners: Male    Birth control/protection: None  Lifestyle  . Physical activity:    Days per week: Not on file    Minutes per session: Not on file  . Stress: Not on file  Relationships  . Social connections:    Talks on phone: Not on file    Gets together: Not on file    Attends religious service: Not on file    Active member of club or organization: Not on file    Attends meetings of clubs or organizations: Not on file    Relationship status: Not on file  . Intimate partner violence:    Fear of current or ex partner: Not on file    Emotionally abused: Not on file    Physically abused: Not on file    Forced sexual activity: Not on file  Other Topics Concern  . Not on file  Social History Narrative   Lives w/ sister   Caffeine use:  Tea and soda- 2-3 per day    Family History  Problem Relation Age of Onset  . Congestive Heart Failure Mother  37       Deceased  . Stroke Mother   . Diabetes Mother   . Stroke Brother        multiple   . Hypertension Brother        x3  . Hypertension Sister        x4  . Migraines Sister   . Diabetes Sister   . Diabetes Brother   . Diabetes Maternal Grandmother   . Breast cancer Maternal Aunt   . Heart attack Maternal Uncle   . Healthy Son        x1  . Healthy Daughter        x2    Past Medical History:  Diagnosis Date  . Breast discharge    bil unknown  time   . GERD (gastroesophageal reflux disease)   . HA (headache)   . History of chicken pox   . Hypertension   . Migraine     Past Surgical History:  Procedure Laterality Date  . CERVICAL SPINE SURGERY    . NO PAST SURGERIES      Current Outpatient  Medications  Medication Sig Dispense Refill  . amLODipine (NORVASC) 5 MG tablet Take 1 tablet by mouth daily.  3  . vitamin B-12 (CYANOCOBALAMIN) 500 MCG tablet Take 500 mcg by mouth daily.     No current facility-administered medications for this visit.     Allergies as of 01/07/2018 - Review Complete 01/07/2018  Allergen Reaction Noted  . Levaquin [levofloxacin in d5w] Diarrhea and Itching 03/20/2017  . Vicodin [hydrocodone-acetaminophen] Nausea And Vomiting 06/15/2015    Vitals: BP (!) 171/98   Pulse 73   Ht 5\' 7"  (1.702 m)   Wt 237 lb (107.5 kg)   BMI 37.12 kg/m  Last Weight:  Wt Readings from Last 1 Encounters:  01/07/18 237 lb (107.5 kg)   Last Height:   Ht Readings from Last 1 Encounters:  01/07/18 5\' 7"  (1.702 m)    Physical exam: Exam: Gen: NAD, conversant, well nourised, obese, well groomed                     CV: RRR, no MRG. No Carotid Bruits. No peripheral edema, warm, nontender Eyes: Conjunctivae clear without exudates or hemorrhage  Neuro: Detailed Neurologic Exam  Speech:    Speech is normal; fluent and spontaneous with normal comprehension.  Cognition:    The patient is oriented to person, place, and time;     recent and remote  memory intact;     language fluent;     normal attention, concentration,     fund of knowledge Cranial Nerves:    The pupils are equal, round, and reactive to light. The fundi are normal and spontaneous venous pulsations are present. Visual fields are full to finger confrontation. Extraocular movements are intact. Trigeminal sensation is intact and the muscles of mastication are normal. The face is symmetric. The palate elevates in the midline. Hearing intact. Voice is normal. Shoulder shrug is normal. The tongue has normal motion without fasciculations.   Coordination:    Normal finger to nose and heel to shin. Normal rapid alternating movements.   Gait:    Heel-toe and tandem gait are normal.   Motor Observation:    No asymmetry, no atrophy, and no involuntary movements noted. Tone:    Normal muscle tone.    Posture:    Posture is normal. normal erect    Strength:    Strength is V/V in the upper and lower limbs.      Sensation: intact to LT     Reflex Exam:  DTR's:    Deep tendon reflexes in the upper and lower extremities are normal bilaterally.   Toes:    The toes are downgoing bilaterally.   Clonus:    Clonus is absent.      Assessment/Plan:  47 year old female with chronic migraines without status not intractable.  - Significant snoring, periodic limb movements of sleep, morning headaches; Sleep evaluation was negative Preventative medication: Has failed multiple classes, will start Aimovig. She declined botox at this time. . -MRI of the brain was unremarkable 07/2015  I recommend close follow-up with primary care for vascular risk factors. Recommend weight loss and diet. Education on lifestyle changes, migraine triggers, and headache diary.  Discussed: To prevent or relieve headaches, try the following: Cool Compress. Lie down and place a cool compress on your head.  Avoid headache triggers. If certain foods or odors seem to have triggered your migraines in  the past, avoid them. A headache diary might help you identify triggers.  Include physical activity  in your daily routine. Try a daily walk or other moderate aerobic exercise.  Manage stress. Find healthy ways to cope with the stressors, such as delegating tasks on your to-do list.  Practice relaxation techniques. Try deep breathing, yoga, massage and visualization.  Eat regularly. Eating regularly scheduled meals and maintaining a healthy diet might help prevent headaches. Also, drink plenty of fluids.  Follow a regular sleep schedule. Sleep deprivation might contribute to headaches Consider biofeedback. With this mind-body technique, you learn to control certain bodily functions - such as muscle tension, heart rate and blood pressure - to prevent headaches or reduce headache pain.    Proceed to emergency room if you experience new or worsening symptoms or symptoms do not resolve, if you have new neurologic symptoms or if headache is severe, or for any concerning symptom.   Provided education and documentation from American headache Society toolbox including articles on: chronic migraine medication overuse headache, chronic migraines, prevention of migraines, behavioral and other nonpharmacologic treatments for headache.   Kim Dean, MD  [facility] [address] [place], [place] [address]  Phone [phone] Fax [phone]  A total of 25  minutes was spent face-to-face with this patient. Over half this time was spent on counseling patient on the chronic migraine diagnosis and different therapeutic options,  risk factor reduction and education.

## 2018-01-09 DIAGNOSIS — G43711 Chronic migraine without aura, intractable, with status migrainosus: Secondary | ICD-10-CM | POA: Insufficient documentation

## 2018-01-13 ENCOUNTER — Telehealth: Payer: Self-pay | Admitting: *Deleted

## 2018-01-13 NOTE — Telephone Encounter (Signed)
LVM need fee for [facility].

## 2018-01-22 ENCOUNTER — Telehealth: Payer: Self-pay | Admitting: Neurology

## 2018-01-22 DIAGNOSIS — Z0289 Encounter for other administrative examinations: Secondary | ICD-10-CM

## 2018-01-22 NOTE — Telephone Encounter (Signed)
Called pt & LVM (ok per DPR) asking for call back to discuss her headaches. Informed her that the office is closing at 5 pm. However can discuss further when she calls or can reevaluate tomorrow.

## 2018-01-22 NOTE — Telephone Encounter (Signed)
Pt has called and wants Dr Lucia GaskinsAhern to know that she has had the worst head aches since she started the Ajovy.  Pt states it was so bad yesterday she could not work.  The pain was in her neck and shoulders.  Pt stated she has had head aches every other day.  Pt is asking for a call to discuss

## 2018-01-22 NOTE — Telephone Encounter (Addendum)
Spoke with the patient. She stated that she is having headaches about every other day, some are light and some are heavier than others. This started since she started Ajovy. Next dose is on May 9th. Yesterday and Sunday she had bad headaches and she could not work yesterday. She says today she has a light headache, not bad. RN advised that if she gets another bad headache pt can go to urgent care if needed while our office is closed. Advised to call back tomorrow morning and see how she is feeling. Pt had mentioned that if this did not work Dr. Lucia GaskinsAhern may start Botox. Work in [place] would be able to assist in her care as Dr. Lucia GaskinsAhern is out of office. However if there is a major change in her treatment plan this would best be discussed with Dr. Lucia GaskinsAhern.  Pt was in agreement. RN reminded pt that taking Tylenol and Ibuprofen can cause rebound headaches. Pt verbalized understanding and said she was not.

## 2018-01-23 NOTE — Telephone Encounter (Signed)
Called to check on pt. She stated that she did get another headache last night in the back her head and "it was weird". However she stated she is ok this morning. RN inquired if the patient's pain usually moves around in different areas of her head and the patient stated that it did. RN advised that since patient is doing ok right now to try to get some rest. However if the headache comes back or if she needs us to please call us back. Also advised that if needed pt can also go to urgent care or ED. Patient verbalized understanding and appreciation.

## 2018-01-24 NOTE — Telephone Encounter (Signed)
Called pt and LVM (ok per DPR) asking for call back to discuss a couple of questions regarding the FMLA forms. Left office number and hours in message.

## 2018-01-24 NOTE — Telephone Encounter (Signed)
Called pt back, LVM asking for call back. Informed her office closing in a few minutes, can speak with her on Monday if she is unable to call back before office closes.

## 2018-01-24 NOTE — Telephone Encounter (Signed)
Pt said she is returning RN's call °

## 2018-01-27 NOTE — Telephone Encounter (Signed)
See other phone note. Spoke with pt and forms were completed.

## 2018-01-27 NOTE — Telephone Encounter (Signed)
Pt called stating that her FMLA paperwork will need to be completed before Wednesday FYI

## 2018-01-27 NOTE — Telephone Encounter (Addendum)
FMLA forms completed, signed, and sent to medical records. There is one release form from [employer] that that patient still needs to sign which is included in the packet.

## 2018-01-27 NOTE — Telephone Encounter (Addendum)
Pt returning RNs call unable to stay on the line right now but would like a call back.

## 2018-01-27 NOTE — Telephone Encounter (Signed)
Spoke with patient. She confirmed this FMLA request is standard, intermittent for her bad headaches. She is aware that Dr. Lucia Gaskins will authorize about 3 days per month. 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 Pt aware FMLA forms will be completed this week. She verbalized appreciation.

## 2018-01-29 NOTE — Telephone Encounter (Signed)
Pt form faxed to [facility] claims on [phone]

## 2018-02-06 ENCOUNTER — Telehealth: Payer: Self-pay | Admitting: Neurology

## 2018-02-06 NOTE — Telephone Encounter (Signed)
Pt called stating when she tried to pick up her refill for Fremanezumab-vfrm (AJOVY) 225 MG/1.5ML SOSY the pharmacist stated she would need a "precription card". Pt is unsure what the pharmacist has been referring to. Please call to advise

## 2018-02-06 NOTE — Telephone Encounter (Signed)
Spoke with patient. She was told to give them a "prescription card". She had given them the copay card. RN looked at her [facility] prescription card which did not contain a pharmacy benefit provider on the card. RN advised pt to call [employer] and find out who her pharmacy benefit provider is. RN will also call [facility] to confirm that this is what they are missing. Pt verbalized understanding.   Called [facility] and discussed that pt did not have a pharmacy benefit in the system. They are aware that RN has advised pt to call [employer] and get this information.

## 2018-02-11 ENCOUNTER — Other Ambulatory Visit: Payer: Self-pay | Admitting: Neurology

## 2018-02-11 ENCOUNTER — Telehealth: Payer: Self-pay | Admitting: Neurology

## 2018-02-11 MED ORDER — RIZATRIPTAN BENZOATE 10 MG PO TBDP: 10.0000 mg | ORAL_TABLET | ORAL | 11 refills | Status: AC | PRN

## 2018-02-11 NOTE — Telephone Encounter (Signed)
I will prescribe an acute medication for migraines for her, will try rizatriptan will call it in.

## 2018-02-11 NOTE — Telephone Encounter (Signed)
Called pt and discussed that Rizatriptan has been ordered by Dr. Lucia Gaskins. 10 mg tablets. Take 1 tablet (10 mg total) by mouth as needed for migraine. May repeat in 2 hours if needed. Max twice in one day. Common side effects include dry mouth, nausea; feeling of pain or tightness in the jaw, neck, or throat; pressure or heavy feeling in any part of your body, drowsiness. Pt verbalized understanding. She stated that she was able to get her prescription benefit information to the pharmacy for her Ajovy. She also asked to be please be written out for today at work due to headache. D/w Dr. Lucia Gaskins, will write letter and send to [facility]. Pt verbalized appreciation.

## 2018-02-11 NOTE — Telephone Encounter (Signed)
Pt called she request Rn to call reg her HA's. Pt did not want to go into detail.

## 2018-02-11 NOTE — Telephone Encounter (Signed)
Called pt. She stated that this is her typical headache, pain is in the right side of her head however pain is also in her neck with this headache that has lasted 2 days. She denies other symptoms with it. She stated she has used all of her FMLA days and she had to call out of work again today. She was advised that if it's bad enough she can go to urgent care or ED. RN will also check with Dr. Lucia Gaskins to see if there is anything pt can take in the meantime. Pt is on Ajovy however she has not gotten her dose this month because the pharmacy does not have a pharmacy benefit listed in the system. Pt was spoken to on 5/9 and she had stated she would call [employer] to find this out. She was encouraged again to call them and find out so the process can move forward with Ajovy. She will call them again.

## 2018-03-07 ENCOUNTER — Telehealth: Payer: Self-pay | Admitting: Neurology

## 2018-03-07 NOTE — Telephone Encounter (Signed)
Spoke with pt. She was able to provide her pharmacy benefit information to the provider and the copay card. PA will need to be done first.

## 2018-03-07 NOTE — Telephone Encounter (Signed)
PA done on cover my meds. [id number];XBJYNW:GNFAOZHYCaseId:[id number];Status:Approved;Review Type:Prior Auth;Coverage Start Date:02/05/2018;Coverage End Date:03/07/2019;

## 2018-03-07 NOTE — Telephone Encounter (Signed)
Spoke with pt. She is aware of Ajovy approval for a year. Pt verbalized appreciation.

## 2018-03-07 NOTE — Telephone Encounter (Signed)
Pt requesting a call to discuss her Ajovy medication, stating the pharmacy requesting a fee for the medication

## 2018-04-23 ENCOUNTER — Encounter: Payer: Self-pay | Admitting: *Deleted

## 2018-04-23 NOTE — Telephone Encounter (Signed)
Faxed letter to [employer] regarding absence from work on 02/11/18. Received a receipt of confirmation.

## 2018-05-07 ENCOUNTER — Telehealth: Payer: Self-pay | Admitting: Neurology

## 2018-05-07 NOTE — Telephone Encounter (Signed)
Pt returned RN's call °

## 2018-05-07 NOTE — Telephone Encounter (Signed)
Called pt and LVM (ok per DPR) asking for call back to discuss if she is taking her maxalt. Advised her that it's ok to take this for acute headache management but advise not taking this more than twice per week or ten times per month. Left office number and hours in message.

## 2018-05-07 NOTE — Telephone Encounter (Signed)
Returned pt's call and LVM asking for call back.  ?

## 2018-05-07 NOTE — Telephone Encounter (Signed)
Pt said Ajovy is not helping with the HA's. She is not taking anything else. Please call to advise

## 2018-05-28 DIAGNOSIS — Z0000 Encounter for general adult medical examination without abnormal findings: Secondary | ICD-10-CM | POA: Diagnosis not present

## 2018-06-11 DIAGNOSIS — Z124 Encounter for screening for malignant neoplasm of cervix: Secondary | ICD-10-CM | POA: Diagnosis not present

## 2018-06-11 DIAGNOSIS — Z0000 Encounter for general adult medical examination without abnormal findings: Secondary | ICD-10-CM | POA: Diagnosis not present

## 2018-06-11 DIAGNOSIS — Z1231 Encounter for screening mammogram for malignant neoplasm of breast: Secondary | ICD-10-CM | POA: Diagnosis not present

## 2018-06-11 DIAGNOSIS — N921 Excessive and frequent menstruation with irregular cycle: Secondary | ICD-10-CM | POA: Diagnosis not present

## 2018-06-11 DIAGNOSIS — E559 Vitamin D deficiency, unspecified: Secondary | ICD-10-CM | POA: Diagnosis not present

## 2018-06-11 DIAGNOSIS — R7301 Impaired fasting glucose: Secondary | ICD-10-CM | POA: Diagnosis not present

## 2018-06-11 DIAGNOSIS — R109 Unspecified abdominal pain: Secondary | ICD-10-CM | POA: Diagnosis not present

## 2018-06-23 DIAGNOSIS — S70362A Insect bite (nonvenomous), left thigh, initial encounter: Secondary | ICD-10-CM | POA: Diagnosis not present

## 2018-06-23 DIAGNOSIS — S50362A Insect bite (nonvenomous) of left elbow, initial encounter: Secondary | ICD-10-CM | POA: Diagnosis not present

## 2018-06-23 DIAGNOSIS — S90562A Insect bite (nonvenomous), left ankle, initial encounter: Secondary | ICD-10-CM | POA: Diagnosis not present

## 2018-06-30 ENCOUNTER — Other Ambulatory Visit: Payer: Self-pay

## 2018-06-30 ENCOUNTER — Encounter (HOSPITAL_BASED_OUTPATIENT_CLINIC_OR_DEPARTMENT_OTHER): Payer: Self-pay | Admitting: *Deleted

## 2018-06-30 ENCOUNTER — Emergency Department (HOSPITAL_BASED_OUTPATIENT_CLINIC_OR_DEPARTMENT_OTHER)
Admission: EM | Admit: 2018-06-30 | Discharge: 2018-07-01 | Disposition: A | Discharge status: Home / Self Care | Payer: BLUE CROSS/BLUE SHIELD | Attending: Emergency Medicine | Admitting: Emergency Medicine

## 2018-06-30 DIAGNOSIS — S80862A Insect bite (nonvenomous), left lower leg, initial encounter: Secondary | ICD-10-CM | POA: Diagnosis not present

## 2018-06-30 DIAGNOSIS — Z79899 Other long term (current) drug therapy: Secondary | ICD-10-CM | POA: Diagnosis not present

## 2018-06-30 DIAGNOSIS — Y9389 Activity, other specified: Secondary | ICD-10-CM | POA: Insufficient documentation

## 2018-06-30 DIAGNOSIS — S40861A Insect bite (nonvenomous) of right upper arm, initial encounter: Secondary | ICD-10-CM | POA: Diagnosis not present

## 2018-06-30 DIAGNOSIS — Y929 Unspecified place or not applicable: Secondary | ICD-10-CM | POA: Diagnosis not present

## 2018-06-30 DIAGNOSIS — Y999 Unspecified external cause status: Secondary | ICD-10-CM | POA: Insufficient documentation

## 2018-06-30 DIAGNOSIS — S80861A Insect bite (nonvenomous), right lower leg, initial encounter: Secondary | ICD-10-CM | POA: Diagnosis not present

## 2018-06-30 DIAGNOSIS — W57XXXA Bitten or stung by nonvenomous insect and other nonvenomous arthropods, initial encounter: Secondary | ICD-10-CM | POA: Diagnosis not present

## 2018-06-30 DIAGNOSIS — I10 Essential (primary) hypertension: Secondary | ICD-10-CM | POA: Insufficient documentation

## 2018-06-30 DIAGNOSIS — S40862A Insect bite (nonvenomous) of left upper arm, initial encounter: Secondary | ICD-10-CM | POA: Insufficient documentation

## 2018-06-30 NOTE — ED Triage Notes (Signed)
Pt c/o multipe insect bites x 7 days , seen by UC x 7 days ago for same No relief with Pepcid , allegra and cortisone cream

## 2018-06-30 NOTE — ED Notes (Signed)
Pt c/o itching from insect bites that are getting worse since she was seen a week ago.  Pt states she is getting more bites, and wants to know "why it's spreading?" Nurse informed pt that whatever is biting her is continuing to bite her which is why she is getting more insect bites, although pt does not seem satisfied with response. Pt states the itching is keeping her awake at night, but that the benadryl she is taking makes her "too sleepy" so she cannot take it at work. Nurse can see one bite on pt's left ankle, the other areas that the patient is pointing to appears to be redness from the pt's armband.

## 2018-07-01 MED ORDER — HYDROXYZINE HCL 25 MG PO TABS
ORAL_TABLET | ORAL | Status: AC
  Filled 2018-07-01: qty 2

## 2018-07-01 MED ORDER — PREDNISONE 10 MG (21) PO TBPK: ORAL_TABLET | ORAL | 0 refills | Status: DC

## 2018-07-01 MED ORDER — HYDROXYZINE HCL 25 MG PO TABS
50.0000 mg | ORAL_TABLET | Freq: Once | ORAL | Status: AC
  Administered 2018-07-01: 50 mg via ORAL

## 2018-07-01 MED ORDER — HYDROXYZINE HCL 25 MG PO TABS: 25.0000 mg | ORAL_TABLET | Freq: Four times a day (QID) | ORAL | 0 refills | Status: DC

## 2018-07-01 NOTE — ED Notes (Signed)
Pt verbalizes understanding of d/c instructions and denies any further needs at this time. 

## 2018-07-01 NOTE — ED Provider Notes (Signed)
TIME SEEN: 12:26 AM  CHIEF COMPLAINT: Insect bites  HPI: Patient is a 47 year old female with history of hypertension and migraines who presents to the emergency department with scattered insect bites for the past week to her upper and lower extremities.  States she is not sure what has been biting her.  She lives with her sister who does not have any similar symptoms.  There are no animals in the house.  She does not think that she has bedbugs.  She denies any new exposures.  States she has been using Benadryl but is itching so much she cannot sleep.  ROS: See HPI Constitutional: no fever  Eyes: no drainage  ENT: no runny nose   Cardiovascular:  no chest pain  Resp: no SOB  GI: no vomiting GU: no dysuria Integumentary: no rash  Allergy: no hives  Musculoskeletal: no leg swelling  Neurological: no slurred speech ROS otherwise negative  PAST MEDICAL HISTORY/PAST SURGICAL HISTORY:  Past Medical History:  Diagnosis Date  . Breast discharge    bil unknown  time   . GERD (gastroesophageal reflux disease)   . HA (headache)   . History of chicken pox   . Hypertension   . Migraine     MEDICATIONS:  Prior to Admission medications   Medication Sig Start Date End Date Taking? Authorizing Provider  famotidine (PEPCID) 20 MG tablet Take 20 mg by mouth 2 (two) times daily.   Yes [provider]  fexofenadine (ALLEGRA) 60 MG tablet Take 60 mg by mouth 2 (two) times daily.   Yes [provider]  hydrocortisone cream 1 % Apply 1 application topically 2 (two) times daily.   Yes [provider]  amLODipine (NORVASC) 5 MG tablet Take 1 tablet by mouth daily. 11/15/17   [provider]  Fremanezumab-vfrm (AJOVY) 225 MG/1.5ML SOSY Inject 225 mg into the skin every 30 (thirty) days. 01/07/18   [name], MD  Fremanezumab-vfrm (AJOVY) 225 MG/1.5ML SOSY Inject 225 mg into the skin every 30 (thirty) days. 01/07/18   [name], MD  rizatriptan (MAXALT-MLT)  10 MG disintegrating tablet Take 1 tablet (10 mg total) by mouth as needed for migraine. May repeat in 2 hours if needed. Max twice in one day. 02/11/18   [name], MD  vitamin B-12 (CYANOCOBALAMIN) 500 MCG tablet Take 500 mcg by mouth daily.    [provider]    ALLERGIES:  Allergies  Allergen Reactions  . Levaquin [Levofloxacin In D5w] Diarrhea and Itching  . Vicodin [Hydrocodone-Acetaminophen] Nausea And Vomiting    SOCIAL HISTORY:  Social History   Tobacco Use  . Smoking status: Never Smoker  . Smokeless tobacco: Never Used  Substance Use Topics  . Alcohol use: Yes    Alcohol/week: 0.0 standard drinks    Comment: rare     FAMILY HISTORY: Family History  Problem Relation Age of Onset  . Congestive Heart Failure Mother 25       Deceased  . Stroke Mother   . Diabetes Mother   . Stroke Brother        multiple   . Hypertension Brother        x3  . Hypertension Sister        x4  . Migraines Sister   . Diabetes Sister   . Diabetes Brother   . Diabetes Maternal Grandmother   . Breast cancer Maternal Aunt   . Heart attack Maternal Uncle   . Healthy Son  x1  . Healthy Daughter        x2    EXAM: BP (!) 156/103 (BP Location: Left Arm)   Pulse 76   Temp 98.1 F (36.7 C)   Resp 18   Ht 5\' 7"  (1.702 m)   Wt 107 kg   LMP 06/23/2018   SpO2 100%   BMI 36.95 kg/m  CONSTITUTIONAL: Alert and oriented and responds appropriately to questions. Well-appearing; well-nourished HEAD: Normocephalic EYES: Conjunctivae clear, pupils appear equal, EOMI ENT: normal nose; moist mucous membranes NECK: Supple, no meningismus, no nuchal rigidity, no LAD  CARD: RRR; S1 and S2 appreciated; no murmurs, no clicks, no rubs, no gallops RESP: Normal chest excursion without splinting or tachypnea; breath sounds clear and equal bilaterally; no wheezes, no rhonchi, no rales, no hypoxia or respiratory distress, speaking full sentences ABD/GI: Normal bowel sounds;  non-distended; soft, non-tender, no rebound, no guarding, no peritoneal signs, no hepatosplenomegaly BACK:  The back appears normal and is non-tender to palpation, there is no CVA tenderness EXT: Normal ROM in all joints; non-tender to palpation; no edema; normal capillary refill; no cyanosis, no calf tenderness or swelling    SKIN: Normal color for age and race; warm; scattered erythematous papules on exam to her upper and lower extremities with excoriations without sign of superimposed infection.  No blisters or desquamation.  No petechiae or purpura.  No bull's-eye rash.  No involvement of her palms or mucous membranes. NEURO: Moves all extremities equally PSYCH: The patient's mood and manner are appropriate. Grooming and personal hygiene are appropriate.  MEDICAL DECISION MAKING: Patient here with insect bites.  She does not think that she has been exposed to bedbugs.  No exposure to fleas.  This could be mosquito bites.  Will give prescription of Atarax for symptomatic treatment and steroid taper.  No sign of superimposed infection today.  Otherwise well-appearing.  At this time, I do not feel there is any life-threatening condition present. I have reviewed and discussed all results (EKG, imaging, lab, urine as appropriate) and exam findings with patient/family. I have reviewed nursing notes and appropriate previous records.  I feel the patient is safe to be discharged home without further emergent workup and can continue workup as an outpatient as needed. Discussed usual and customary return precautions. Patient/family verbalize understanding and are comfortable with this plan.  Outpatient follow-up has been provided if needed. All questions have been answered.      Valita Righter, Layla Maw, DO 07/01/18 [phone]

## 2018-07-15 ENCOUNTER — Encounter: Payer: Self-pay | Admitting: Adult Health

## 2018-07-15 ENCOUNTER — Ambulatory Visit: Payer: BLUE CROSS/BLUE SHIELD | Admitting: Adult Health

## 2018-10-10 DIAGNOSIS — R1084 Generalized abdominal pain: Secondary | ICD-10-CM | POA: Diagnosis not present

## 2018-10-10 DIAGNOSIS — K219 Gastro-esophageal reflux disease without esophagitis: Secondary | ICD-10-CM | POA: Diagnosis not present

## 2018-10-10 DIAGNOSIS — G43919 Migraine, unspecified, intractable, without status migrainosus: Secondary | ICD-10-CM | POA: Diagnosis not present

## 2018-10-10 DIAGNOSIS — R197 Diarrhea, unspecified: Secondary | ICD-10-CM | POA: Diagnosis not present

## 2018-11-05 DIAGNOSIS — G43919 Migraine, unspecified, intractable, without status migrainosus: Secondary | ICD-10-CM | POA: Diagnosis not present

## 2018-11-17 DIAGNOSIS — R0683 Snoring: Secondary | ICD-10-CM | POA: Diagnosis not present

## 2018-11-17 DIAGNOSIS — G43019 Migraine without aura, intractable, without status migrainosus: Secondary | ICD-10-CM | POA: Diagnosis not present

## 2018-11-17 DIAGNOSIS — G4733 Obstructive sleep apnea (adult) (pediatric): Secondary | ICD-10-CM | POA: Diagnosis not present

## 2018-11-17 DIAGNOSIS — G43709 Chronic migraine without aura, not intractable, without status migrainosus: Secondary | ICD-10-CM | POA: Diagnosis not present

## 2019-02-04 DIAGNOSIS — I10 Essential (primary) hypertension: Secondary | ICD-10-CM | POA: Diagnosis not present

## 2019-02-04 DIAGNOSIS — G43019 Migraine without aura, intractable, without status migrainosus: Secondary | ICD-10-CM | POA: Diagnosis not present

## 2019-02-04 DIAGNOSIS — K219 Gastro-esophageal reflux disease without esophagitis: Secondary | ICD-10-CM | POA: Diagnosis not present

## 2019-02-11 ENCOUNTER — Emergency Department (HOSPITAL_BASED_OUTPATIENT_CLINIC_OR_DEPARTMENT_OTHER)
Admission: EM | Admit: 2019-02-11 | Discharge: 2019-02-11 | Disposition: A | Discharge status: Home / Self Care | Payer: BLUE CROSS/BLUE SHIELD | Attending: Emergency Medicine | Admitting: Emergency Medicine

## 2019-02-11 ENCOUNTER — Encounter (HOSPITAL_BASED_OUTPATIENT_CLINIC_OR_DEPARTMENT_OTHER): Payer: Self-pay | Admitting: *Deleted

## 2019-02-11 ENCOUNTER — Other Ambulatory Visit: Payer: Self-pay

## 2019-02-11 DIAGNOSIS — R202 Paresthesia of skin: Secondary | ICD-10-CM | POA: Diagnosis not present

## 2019-02-11 DIAGNOSIS — Z79899 Other long term (current) drug therapy: Secondary | ICD-10-CM | POA: Diagnosis not present

## 2019-02-11 DIAGNOSIS — G43009 Migraine without aura, not intractable, without status migrainosus: Secondary | ICD-10-CM | POA: Diagnosis not present

## 2019-02-11 DIAGNOSIS — I10 Essential (primary) hypertension: Secondary | ICD-10-CM | POA: Diagnosis not present

## 2019-02-11 DIAGNOSIS — R51 Headache: Secondary | ICD-10-CM | POA: Diagnosis not present

## 2019-02-11 MED ORDER — METOCLOPRAMIDE HCL 5 MG/ML IJ SOLN
10.0000 mg | Freq: Once | INTRAMUSCULAR | Status: AC
  Administered 2019-02-11: 10 mg via INTRAVENOUS
  Filled 2019-02-11: qty 2

## 2019-02-11 MED ORDER — DIPHENHYDRAMINE HCL 50 MG/ML IJ SOLN
25.0000 mg | Freq: Once | INTRAMUSCULAR | Status: AC
  Administered 2019-02-11: 25 mg via INTRAVENOUS
  Filled 2019-02-11: qty 1

## 2019-02-11 MED ORDER — KETOROLAC TROMETHAMINE 30 MG/ML IJ SOLN
30.0000 mg | Freq: Once | INTRAMUSCULAR | Status: AC
  Administered 2019-02-11: 21:00:00 30 mg via INTRAVENOUS
  Filled 2019-02-11: qty 1

## 2019-02-11 NOTE — ED Provider Notes (Signed)
[facility] EMERGENCY DEPARTMENT Provider Note   CSN: [id number] Arrival date & time: 02/11/19  2021    History   Chief Complaint Chief Complaint  Patient presents with  . Headache    HPI Kim Macdonald is a 48 y.o. female.     HPI Patient reports he has had a headache for years.  He reports he has nearly daily migraine headaches.  She reports nothing is very effective for helping the symptoms.  She reports she was just prescribed some Topamax but she has not picked it up yet.  Headache can be variable in location.  Today it is mostly on the left side of the head and aching in quality.  No visual changes.  Patient reports she has had a tingly kind of numb sensation on the left side of her body for many years.  She reports no testing is ever identified what causes it.  She reports she had a neck surgery and that never improved the symptom.  She reports she has been talking to her neurologist about the symptoms but the last MRI she has had was about 2016.  She reports she is going to call them back again tomorrow.  She reports today she felt like there was some tingling in the right side as well.  She has not developed any fever, no sinus symptoms, no sore throat.  No incoordination or dizziness.  No vomiting.  Patient reports she usually tries some Excedrin for her headaches.  It did not help. Past Medical History:  Diagnosis Date  . Breast discharge    bil unknown  time   . GERD (gastroesophageal reflux disease)   . HA (headache)   . History of chicken pox   . Hypertension   . Migraine     Patient Active Problem List   Diagnosis Date Noted  . Chronic migraine without aura, with intractable migraine, so stated, with status migrainosus 01/09/2018  . Hypertension 04/19/2017  . Chronic migraine 04/19/2017  . B12 deficiency 04/19/2017  . Vitamin D deficiency 04/19/2017  . Fatigue 10/18/2016  . 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 Sleep related  headaches 03/15/2016  . Snoring 03/15/2016  . 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 Migraine with aura and without status migrainosus 03/13/2016  . Cervical neck pain with evidence of disc disease 07/04/2015  . 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 CERVICAL SPINE SURGERY    . NO PAST SURGERIES       OB History   No obstetric history on file.      Home Medications    Prior to Admission medications   Medication Sig Start Date End Date Taking? Authorizing Provider  amLODipine (NORVASC) 5 MG tablet Take 1 tablet by mouth daily. 11/15/17   [provider]  famotidine (PEPCID) 20 MG tablet Take 20 mg by mouth 2 (two) times daily.    [provider]  fexofenadine (ALLEGRA) 60 MG tablet Take 60 mg by mouth 2 (two) times daily.    [provider]  Fremanezumab-vfrm (AJOVY) 225 MG/1.5ML SOSY Inject 225 mg into the skin every 30 (thirty) days. 01/07/18   Anson FretAhern, Antonia B, MD  Fremanezumab-vfrm (AJOVY) 225 MG/1.5ML SOSY Inject 225 mg into the skin every 30 (thirty) days. 01/07/18   Anson FretAhern, Antonia B, MD  hydrocortisone cream 1 % Apply 1 application topically 2 (two) times daily.    [provider]  rizatriptan (MAXALT-MLT) 10 MG disintegrating tablet Take 1 tablet (10 mg total) by mouth as needed for migraine. May repeat in 2 hours if needed. Max twice in one day. 02/11/18   [name], MD  vitamin B-12 (CYANOCOBALAMIN) 500 MCG tablet Take 500 mcg by mouth daily.    [provider]    Family History Family History  Problem Relation Age of Onset  . Congestive Heart Failure Mother 7       Deceased  . Stroke Mother   . Diabetes Mother   . Stroke Brother        multiple   . Hypertension Brother        x3  . Hypertension Sister        x4  . Migraines Sister   . Diabetes Sister   . Diabetes Brother   . Diabetes Maternal Grandmother   . Breast cancer Maternal Aunt    . Heart attack Maternal Uncle   . Healthy Son        x1  . Healthy Daughter        x2    Social History Social History   Tobacco Use  . Smoking status: Never Smoker  . Smokeless tobacco: Never Used  Substance Use Topics  . Alcohol use: Yes    Alcohol/week: 0.0 standard drinks    Comment: rare   . Drug use: No     Allergies   Levaquin [levofloxacin in d5w] and Vicodin [hydrocodone-acetaminophen]   Review of Systems Review of Systems 10 Systems reviewed and are negative for acute change except as noted in the HPI.   Physical Exam Updated Vital Signs BP (!) 154/89 (BP Location: Left Arm)   Pulse 78   Temp 98.1 F (36.7 C) (Oral)   Resp 18   Ht 5\' 7"  (1.702 m)   Wt 109.6 kg   SpO2 100%   BMI 37.86 kg/m   Physical Exam Constitutional:      Appearance: Normal appearance.     Comments: Patient is clinically well appearance.  She is alert and interactive.  Pleasant.  HENT:     Head: Normocephalic and atraumatic.     Nose: Nose normal.     Mouth/Throat:     Mouth: Mucous membranes are moist.     Pharynx: Oropharynx is clear.     Comments: Dentition is in excellent condition. Eyes:     Extraocular Movements: Extraocular movements intact.     Conjunctiva/sclera: Conjunctivae normal.     Pupils: Pupils are equal, round, and reactive to light.     Comments: Normal consensual pupillary responses.  Neck:     Musculoskeletal: Normal range of motion and neck supple.     Comments: No meningismus and no thyromegaly. Cardiovascular:     Rate and Rhythm: Normal rate and regular rhythm.     Pulses: Normal pulses.     Heart sounds: Normal heart sounds.  Pulmonary:     Effort: Pulmonary effort is normal.     Breath sounds: Normal breath sounds.  Abdominal:     General: There is no distension.     Palpations: Abdomen is soft.     Tenderness: There is no abdominal tenderness. There is no guarding.  Musculoskeletal: Normal range of motion.        General: No swelling  or tenderness.     Right lower leg: No edema.     Left lower leg: No edema.  Skin:    General: Skin  is warm and dry.  Neurological:     General: No focal deficit present.     Mental Status: She is alert and oriented to person, place, and time.     Cranial Nerves: No cranial nerve deficit.     Sensory: No sensory deficit.     Motor: No weakness.     Coordination: Coordination normal.     Gait: Gait normal.     Deep Tendon Reflexes: Reflexes normal.     Comments: Patient's cognitive function is normal.  Speech is normal.  Station is normal to light touch.  Patient does not perceive differential left or right to light touch.  Reports the paresthesias more of an "inside feeling".  Biceps and patellar reflexes are 2+ and symmetric.  Psychiatric:        Mood and Affect: Mood normal.     Comments: Patient's mood is pleasant and interactive.      ED Treatments / Results  Labs (all labs ordered are listed, but only abnormal results are displayed) Labs Reviewed - No data to display  EKG None  Radiology No results found.  Procedures Procedures (including critical care time)  Medications Ordered in ED Medications  metoCLOPramide (REGLAN) injection 10 mg (10 mg Intravenous Given 02/11/19 2113)  diphenhydrAMINE (BENADRYL) injection 25 mg (25 mg Intravenous Given 02/11/19 2112)  ketorolac (TORADOL) 30 MG/ML injection 30 mg (30 mg Intravenous Given 02/11/19 2114)     Initial Impression / Assessment and Plan / ED Course  I have reviewed the triage vital signs and the nursing notes.  Pertinent labs & imaging results that were available during my care of the patient were reviewed by me and considered in my medical decision making (see chart for details).       On recheck, patient feels improved.  She is alert and appropriate.  Headache is improved.  Patient presents with chronic symptoms of headache and paresthesias that has been longstanding.  EMR review indicates patient does see  neurology and has had these symptoms previously.  Her physical exam is normal without any deficits present.  No motor or sensory deficit.  Coordination and cognitive function is good.  At this time, I do not feel that further imaging is emergently indicated.  I have discussed with the patient following up with neurology to determine if they feel repeat MRIs are indicated at this time due to persistence and somewhat migratory nature of her headaches and paresthesia which are quite chronic.  Return precautions are reviewed.  Final Clinical Impressions(s) / ED Diagnoses   Final diagnoses:  Migraine without aura and without status migrainosus, not intractable  Paresthesia    ED Discharge Orders    None       Arby Barrette, MD 02/11/19 2221

## 2019-02-11 NOTE — Discharge Instructions (Signed)
1.  Start your Topamax as prescribed. 2.  Call your neurologist tomorrow to let them know you were seen in the emergency department to discuss further evaluation and ongoing treatment. 3.  Return to the emergency department if you have new, worsening or changing symptoms.

## 2019-02-11 NOTE — ED Triage Notes (Signed)
Pt c/o h/a and left sided weakness x 2 days

## 2019-02-17 ENCOUNTER — Encounter: Payer: Self-pay | Admitting: *Deleted

## 2019-02-19 ENCOUNTER — Telehealth: Payer: Self-pay | Admitting: *Deleted

## 2019-02-19 NOTE — Telephone Encounter (Signed)
Continuation PA received for Ajovy from [facility]. Pt had not responded to [employer]. I called her and received a busy signal.   If she calls back, please ask her if the Ajovy is helping her migraines? Are they less frequent, less severe?

## 2019-02-24 ENCOUNTER — Encounter: Payer: Self-pay | Admitting: *Deleted

## 2019-03-04 DIAGNOSIS — R110 Nausea: Secondary | ICD-10-CM | POA: Diagnosis not present

## 2019-03-04 DIAGNOSIS — G43019 Migraine without aura, intractable, without status migrainosus: Secondary | ICD-10-CM | POA: Diagnosis not present

## 2019-03-04 DIAGNOSIS — R51 Headache: Secondary | ICD-10-CM | POA: Diagnosis not present

## 2019-03-04 DIAGNOSIS — R200 Anesthesia of skin: Secondary | ICD-10-CM | POA: Diagnosis not present

## 2019-03-10 DIAGNOSIS — K219 Gastro-esophageal reflux disease without esophagitis: Secondary | ICD-10-CM | POA: Diagnosis not present

## 2019-03-10 DIAGNOSIS — G43709 Chronic migraine without aura, not intractable, without status migrainosus: Secondary | ICD-10-CM | POA: Diagnosis not present

## 2019-03-10 DIAGNOSIS — G43019 Migraine without aura, intractable, without status migrainosus: Secondary | ICD-10-CM | POA: Diagnosis not present

## 2019-03-10 DIAGNOSIS — I10 Essential (primary) hypertension: Secondary | ICD-10-CM | POA: Diagnosis not present

## 2019-04-02 DIAGNOSIS — G43919 Migraine, unspecified, intractable, without status migrainosus: Secondary | ICD-10-CM | POA: Diagnosis not present

## 2019-04-02 DIAGNOSIS — R109 Unspecified abdominal pain: Secondary | ICD-10-CM | POA: Diagnosis not present

## 2019-04-02 DIAGNOSIS — K219 Gastro-esophageal reflux disease without esophagitis: Secondary | ICD-10-CM | POA: Diagnosis not present

## 2019-04-02 DIAGNOSIS — R198 Other specified symptoms and signs involving the digestive system and abdomen: Secondary | ICD-10-CM | POA: Diagnosis not present

## 2019-04-08 DIAGNOSIS — E669 Obesity, unspecified: Secondary | ICD-10-CM | POA: Diagnosis not present

## 2019-04-08 DIAGNOSIS — K219 Gastro-esophageal reflux disease without esophagitis: Secondary | ICD-10-CM | POA: Diagnosis not present

## 2019-04-08 DIAGNOSIS — R140 Abdominal distension (gaseous): Secondary | ICD-10-CM | POA: Diagnosis not present

## 2019-04-08 DIAGNOSIS — Z79899 Other long term (current) drug therapy: Secondary | ICD-10-CM | POA: Diagnosis not present

## 2019-04-08 DIAGNOSIS — R197 Diarrhea, unspecified: Secondary | ICD-10-CM | POA: Diagnosis not present

## 2019-04-08 DIAGNOSIS — R143 Flatulence: Secondary | ICD-10-CM | POA: Diagnosis not present

## 2019-04-08 DIAGNOSIS — R1031 Right lower quadrant pain: Secondary | ICD-10-CM | POA: Diagnosis not present

## 2019-04-08 DIAGNOSIS — E559 Vitamin D deficiency, unspecified: Secondary | ICD-10-CM | POA: Diagnosis not present

## 2019-04-08 DIAGNOSIS — R1011 Right upper quadrant pain: Secondary | ICD-10-CM | POA: Diagnosis not present

## 2019-04-08 DIAGNOSIS — Z6839 Body mass index (BMI) 39.0-39.9, adult: Secondary | ICD-10-CM | POA: Diagnosis not present

## 2019-04-08 DIAGNOSIS — E538 Deficiency of other specified B group vitamins: Secondary | ICD-10-CM | POA: Diagnosis not present

## 2019-04-28 DIAGNOSIS — R51 Headache: Secondary | ICD-10-CM | POA: Diagnosis not present

## 2019-04-28 DIAGNOSIS — R05 Cough: Secondary | ICD-10-CM | POA: Diagnosis not present

## 2019-04-28 DIAGNOSIS — Z20828 Contact with and (suspected) exposure to other viral communicable diseases: Secondary | ICD-10-CM | POA: Diagnosis not present

## 2019-05-08 DIAGNOSIS — G4730 Sleep apnea, unspecified: Secondary | ICD-10-CM | POA: Diagnosis not present

## 2019-05-08 DIAGNOSIS — G43719 Chronic migraine without aura, intractable, without status migrainosus: Secondary | ICD-10-CM | POA: Diagnosis not present

## 2019-05-11 DIAGNOSIS — J209 Acute bronchitis, unspecified: Secondary | ICD-10-CM | POA: Diagnosis not present

## 2019-05-11 DIAGNOSIS — J069 Acute upper respiratory infection, unspecified: Secondary | ICD-10-CM | POA: Diagnosis not present

## 2019-05-11 DIAGNOSIS — R05 Cough: Secondary | ICD-10-CM | POA: Diagnosis not present

## 2019-05-12 DIAGNOSIS — R509 Fever, unspecified: Secondary | ICD-10-CM | POA: Diagnosis not present

## 2019-05-12 DIAGNOSIS — J069 Acute upper respiratory infection, unspecified: Secondary | ICD-10-CM | POA: Diagnosis not present

## 2019-05-12 DIAGNOSIS — R05 Cough: Secondary | ICD-10-CM | POA: Diagnosis not present

## 2019-07-02 DIAGNOSIS — E559 Vitamin D deficiency, unspecified: Secondary | ICD-10-CM | POA: Diagnosis not present

## 2019-07-02 DIAGNOSIS — L83 Acanthosis nigricans: Secondary | ICD-10-CM | POA: Diagnosis not present

## 2019-08-04 DIAGNOSIS — G4730 Sleep apnea, unspecified: Secondary | ICD-10-CM | POA: Diagnosis not present

## 2019-08-04 DIAGNOSIS — G43709 Chronic migraine without aura, not intractable, without status migrainosus: Secondary | ICD-10-CM | POA: Diagnosis not present

## 2019-09-09 DIAGNOSIS — G4733 Obstructive sleep apnea (adult) (pediatric): Secondary | ICD-10-CM | POA: Diagnosis not present

## 2019-09-30 ENCOUNTER — Encounter: Payer: Self-pay | Admitting: *Deleted

## 2021-04-10 ENCOUNTER — Encounter (HOSPITAL_BASED_OUTPATIENT_CLINIC_OR_DEPARTMENT_OTHER): Payer: Self-pay | Admitting: *Deleted

## 2021-04-10 ENCOUNTER — Emergency Department (HOSPITAL_BASED_OUTPATIENT_CLINIC_OR_DEPARTMENT_OTHER)
Admission: EM | Admit: 2021-04-10 | Discharge: 2021-04-10 | Disposition: A | Discharge status: Home / Self Care | Payer: BC Managed Care – PPO | Attending: Emergency Medicine | Admitting: Emergency Medicine

## 2021-04-10 ENCOUNTER — Other Ambulatory Visit: Payer: Self-pay

## 2021-04-10 DIAGNOSIS — Z79899 Other long term (current) drug therapy: Secondary | ICD-10-CM | POA: Insufficient documentation

## 2021-04-10 DIAGNOSIS — R110 Nausea: Secondary | ICD-10-CM | POA: Insufficient documentation

## 2021-04-10 DIAGNOSIS — R7989 Other specified abnormal findings of blood chemistry: Secondary | ICD-10-CM

## 2021-04-10 DIAGNOSIS — I10 Essential (primary) hypertension: Secondary | ICD-10-CM | POA: Diagnosis not present

## 2021-04-10 DIAGNOSIS — R519 Headache, unspecified: Secondary | ICD-10-CM

## 2021-04-10 LAB — CBC WITH DIFFERENTIAL/PLATELET
Abs Immature Granulocytes: 0.07 10*3/uL (ref 0.00–0.07)
Basophils Absolute: 0 10*3/uL (ref 0.0–0.1)
Basophils Relative: 0 %
Eosinophils Absolute: 0.1 10*3/uL (ref 0.0–0.5)
Eosinophils Relative: 1 %
HCT: 41.3 % (ref 36.0–46.0)
Hemoglobin: 13.9 g/dL (ref 12.0–15.0)
Immature Granulocytes: 1 %
Lymphocytes Relative: 17 %
Lymphs Abs: 2.3 10*3/uL (ref 0.7–4.0)
MCH: 32.5 pg (ref 26.0–34.0)
MCHC: 33.7 g/dL (ref 30.0–36.0)
MCV: 96.5 fL (ref 80.0–100.0)
Monocytes Absolute: 0.9 10*3/uL (ref 0.1–1.0)
Monocytes Relative: 7 %
Neutro Abs: 10 10*3/uL — ABNORMAL HIGH (ref 1.7–7.7)
Neutrophils Relative %: 74 %
Platelets: 276 10*3/uL (ref 150–400)
RBC: 4.28 MIL/uL (ref 3.87–5.11)
RDW: 13 % (ref 11.5–15.5)
WBC: 13.4 10*3/uL — ABNORMAL HIGH (ref 4.0–10.5)
nRBC: 0 % (ref 0.0–0.2)

## 2021-04-10 LAB — BASIC METABOLIC PANEL
Anion gap: 8 (ref 5–15)
BUN: 12 mg/dL (ref 6–20)
CO2: 25 mmol/L (ref 22–32)
Calcium: 8.9 mg/dL (ref 8.9–10.3)
Chloride: 103 mmol/L (ref 98–111)
Creatinine, Ser: 1.04 mg/dL — ABNORMAL HIGH (ref 0.44–1.00)
GFR, Estimated: >60 mL/min (ref >60)
Glucose, Bld: 138 mg/dL — ABNORMAL HIGH (ref 70–99)
Potassium: 3.5 mmol/L (ref 3.5–5.1)
Sodium: 136 mmol/L (ref 135–145)

## 2021-04-10 MED ORDER — METOCLOPRAMIDE HCL 5 MG/ML IJ SOLN
10.0000 mg | Freq: Once | INTRAMUSCULAR | Status: AC
  Administered 2021-04-10: 10 mg via INTRAVENOUS
  Filled 2021-04-10: qty 2

## 2021-04-10 MED ORDER — SODIUM CHLORIDE 0.9 % IV BOLUS
1000.0000 mL | Freq: Once | INTRAVENOUS | Status: AC
  Administered 2021-04-10: 1000 mL via INTRAVENOUS

## 2021-04-10 MED ORDER — KETOROLAC TROMETHAMINE 15 MG/ML IJ SOLN
15.0000 mg | Freq: Once | INTRAMUSCULAR | Status: AC
  Administered 2021-04-10: 15 mg via INTRAVENOUS
  Filled 2021-04-10: qty 1

## 2021-04-10 MED ORDER — DIPHENHYDRAMINE HCL 50 MG/ML IJ SOLN
50.0000 mg | Freq: Once | INTRAMUSCULAR | Status: AC
  Administered 2021-04-10: 50 mg via INTRAVENOUS
  Filled 2021-04-10: qty 1

## 2021-04-10 NOTE — ED Notes (Signed)
Pt provided discharge instructions and prescription information. Pt was given the opportunity to ask questions and questions were answered. Discharge signature not obtained in the setting of the COVID-19 pandemic in order to reduce high touch surfaces.  ° °

## 2021-04-10 NOTE — ED Triage Notes (Addendum)
Pt reports headache and feeling light headed "spinning" since yesterday. Also reports pain in her neck and jaw. Also reports some Shob

## 2021-04-10 NOTE — Discharge Instructions (Addendum)
Please follow up with Neurology and your PCP regarding ED visit today.   Increase the amount of water you drink as your kidney function was slightly elevated today. You will need to have your kidney function rechecked in 1-2 weeks.  Return to the ED for any new/worsening symptoms

## 2021-04-10 NOTE — ED Provider Notes (Signed)
[facility] EMERGENCY DEPARTMENT Provider Note   CSN: [id number] Arrival date & time: 04/10/21  1405     History Chief Complaint  Patient presents with   Headache    Kim Macdonald is a 50 y.o. female with Pmhx HTN and migraine headaches who presents to the ED today with complaint of gradual onset, constant, achy, diffuse, headache for the past 1 day. Pt also complains of nausea and fatigue. She states that this feels like a typical migraine headache for her. Denies worse headache of life. She does mention that her blood pressure has been "up and down lately" as well. She is compliant with her blood pressure medications. States that a couple of months ago her blood pressure meds were increased however she has not followed up with her PCP for same. She states that her diastolic BP is typically higher than her systolic BP. She was on Emgality for awhile however was recently changed to Ajovy injections - she is supposed to have an injection every 30 days however thinks she has not had one in a couple of months as well. Pt states she continues to miss work due to her headaches and is concerned she could lose her job soon. She had to call out of work today due to the headache. No other complaints at this time.   The history is provided by the patient and medical records.      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     Family History  Problem Relation Age of Onset   Congestive Heart Failure Mother [id number]       Deceased   Stroke Mother    Diabetes Mother    Stroke Brother        multiple    Hypertension Brother        x3   Hypertension Sister        x4   Migraines Sister    Diabetes Sister    Diabetes Brother    Diabetes Maternal Grandmother    Breast cancer Maternal Aunt    Heart attack Maternal Uncle    Healthy Son        x1   Healthy Daughter        x2    Social History   Tobacco Use   Smoking status: Never   Smokeless tobacco: Never  Vaping Use   Vaping Use: Never used  Substance Use  Topics   Alcohol use: Yes    Alcohol/week: 0.0 standard drinks    Comment: rare    Drug use: No    Home Medications Prior to Admission medications   Medication Sig Start Date End Date Taking? Authorizing Provider  amLODipine (NORVASC) 5 MG tablet Take 1 tablet by mouth daily. 11/15/17   [provider]  famotidine (PEPCID) 20 MG tablet Take 20 mg by mouth 2 (two) times daily.    [provider]  fexofenadine (ALLEGRA) 60 MG tablet Take 60 mg by mouth 2 (two) times daily.    [provider]  Fremanezumab-vfrm (AJOVY) 225 MG/1.5ML SOSY Inject 225 mg into the skin every 30 (thirty) days. 01/07/18   [name], MD  Fremanezumab-vfrm (AJOVY) 225 MG/1.5ML SOSY Inject 225 mg into the skin every 30 (thirty) days. 01/07/18   [name], MD  hydrocortisone cream 1 % Apply 1 application topically 2 (two) times daily.    [provider]  rizatriptan (MAXALT-MLT) 10 MG  disintegrating tablet Take 1 tablet (10 mg total) by mouth as needed for migraine. May repeat in 2 hours if needed. Max twice in one day. 02/11/18   [name], MD  vitamin B-12 (CYANOCOBALAMIN) 500 MCG tablet Take 500 mcg by mouth daily.    [provider]    Allergies    Levaquin [levofloxacin in d5w] and Vicodin [hydrocodone-acetaminophen]  Review of Systems   Review of Systems  Constitutional:  Negative for chills and fever.  Eyes:  Negative for photophobia and pain.  Gastrointestinal:  Positive for nausea. Negative for vomiting.  Neurological:  Positive for headaches. Negative for syncope, weakness and numbness.  All other systems reviewed and are negative.  Physical Exam Updated Vital Signs BP (!) 163/88   Pulse 66   Temp 97.9 F (36.6 C) (Oral)   Resp 15   Ht 5\' 7"  (1.702 m)   Wt 113.4 kg   SpO2 97%   BMI 39.16 kg/m   Physical Exam Vitals and nursing note reviewed.  Constitutional:      Appearance: She is not ill-appearing.  HENT:     Head: Normocephalic and atraumatic.  Eyes:     Extraocular Movements: Extraocular movements intact.     Conjunctiva/sclera: Conjunctivae normal.     Pupils: Pupils are equal, round, and reactive to light.  Neck:     Meningeal: Brudzinski's sign and Kernig's sign absent.  Cardiovascular:     Rate and Rhythm: Normal rate and regular rhythm.  Pulmonary:     Effort: Pulmonary effort is normal.     Breath sounds: Normal breath sounds. No wheezing, rhonchi or rales.  Abdominal:     Palpations: Abdomen is soft.     Tenderness: There is no abdominal tenderness.  Musculoskeletal:     Cervical back: Neck supple.  Skin:    General: Skin is warm and dry.  Neurological:     Mental Status: She is alert.     Comments: Alert and oriented to self, place, time and event.   Speech is fluent, clear without dysarthria or dysphasia.   Strength 5/5 in upper/lower extremities   Sensation intact in upper/lower extremities    Normal gait.  Negative Romberg. No pronator drift.  Normal finger-to-nose and feet tapping.  CN I not tested  CN II grossly intact visual fields bilaterally. Did not visualize posterior eye.  CN III, IV, VI PERRLA and EOMs intact bilaterally  CN V Intact sensation to sharp and light touch  to the face  CN VII facial movements symmetric  CN VIII not tested  CN IX, X no uvula deviation, symmetric rise of soft palate  CN XI 5/5 SCM and trapezius strength bilaterally  CN XII Midline tongue protrusion, symmetric L/R movements      ED Results / Procedures / Treatments   Labs (all labs ordered are listed, but only abnormal results are displayed) Labs Reviewed  BASIC METABOLIC PANEL - Abnormal; Notable for the following components:      Result Value   Glucose, Bld 138 (*)    Creatinine, Ser 1.04 (*)    All other components within normal limits  CBC WITH DIFFERENTIAL/PLATELET - Abnormal; Notable for the following components:   WBC 13.4 (*)    Neutro Abs 10.0 (*)    All other components within normal limits    EKG EKG Interpretation  Date/Time:  Monday April 10 2021 14:22:35 EDT Ventricular Rate:  67 PR Interval:  132 QRS Duration: 79 QT Interval:  390 QTC Calculation: 412 R Axis:   46 Text Interpretation: Sinus rhythm Borderline T abnormalities, inferior leads No significant change since prior 10/17 Confirmed by Meridee Score [phone]) on 04/10/2021 2:28:22 PM  Radiology No results found.  Procedures Procedures   Medications Ordered in ED Medications  sodium chloride 0.9 % bolus 1,000 mL (1,000 mLs Intravenous New Bag/Given 04/10/21 1457)  metoCLOPramide (REGLAN) injection 10 mg (10 mg Intravenous Given 04/10/21 1459)  diphenhydrAMINE (BENADRYL) injection 50 mg (50 mg Intravenous Given 04/10/21 1459)  ketorolac (TORADOL) 15 MG/ML injection 15 mg (15 mg Intravenous Given 04/10/21 1458)    ED Course  I have reviewed the triage vital signs and the nursing notes.  Pertinent  labs & imaging results that were available during my care of the patient were reviewed by me and considered in my medical decision making (see chart for details).    MDM Rules/Calculators/A&P                          50 year old female who presents to the ED today with complaint of recurrent migraine headache that started last night.  Associated nausea.  Also complaining of elevated blood pressure readings at home despite being compliant on blood pressure medications.  Has not followed up with PCP for same.  Continues to miss work due to recurrent headaches causing concern.  On arrival to the ED today patient is afebrile, nontachycardic and nontachypneic and appears to be in no acute distress.  She is noted to have an elevated blood pressure reading today at 160/114 with repeat 174/92 which does appear baseline for patient.  On my exam she has no focal neurodeficits and no meningeal signs.  We will treat with migraine headache at this time and obtain lab work given elevated blood pressure readings however does appear that these complaints are chronic in nature and she will need to follow-up with PCP and neurology for same.  She is supposed to be on Ajovy injections every 30 days however has not received it in couple of months.  Appear that she no showed to her neurology office on 03/14/2021.   CBC with mild elevated WBC at 13.4, no left shift. No infectious symptoms today. No meningeal signs.  BMP with glucose 138 and creatinine slightly elevated at 1.04. Fluids running.   On reevaluation pt resting comfortably. Reports headache has improved. Will discharge home at this time. Pt instructed to follow up with both neurology and PCP regarding  ED visit and recurrent migraines. Pt in agreement with plan. Requesting work note today.   This note was prepared using Dragon voice recognition software and may include unintentional dictation errors due to the inherent limitations of voice recognition  software.   Final Clinical Impression(s) / ED Diagnoses Final diagnoses:  Bad headache  Primary hypertension  Elevated serum creatinine    Rx / DC Orders ED Discharge Orders     None        Discharge Instructions      Please follow up with Neurology and your PCP regarding ED visit today.   Increase the amount of water you drink as your kidney function was slightly elevated today. You will need to have your kidney function rechecked in 1-2 weeks.  Return to the ED for any new/worsening symptoms       Tanda Rockers, Cordelia Poche 04/10/21 1610    Terrilee Files, MD 04/10/21 [phone]

## 2021-04-11 ENCOUNTER — Encounter (HOSPITAL_BASED_OUTPATIENT_CLINIC_OR_DEPARTMENT_OTHER): Payer: Self-pay | Admitting: Emergency Medicine

## 2021-04-11 ENCOUNTER — Emergency Department (HOSPITAL_BASED_OUTPATIENT_CLINIC_OR_DEPARTMENT_OTHER)
Admission: EM | Admit: 2021-04-11 | Discharge: 2021-04-11 | Disposition: A | Discharge status: Home / Self Care | Payer: BC Managed Care – PPO | Attending: Emergency Medicine | Admitting: Emergency Medicine

## 2021-04-11 ENCOUNTER — Other Ambulatory Visit: Payer: Self-pay

## 2021-04-11 ENCOUNTER — Emergency Department (HOSPITAL_BASED_OUTPATIENT_CLINIC_OR_DEPARTMENT_OTHER): Payer: BC Managed Care – PPO

## 2021-04-11 DIAGNOSIS — Z79899 Other long term (current) drug therapy: Secondary | ICD-10-CM | POA: Diagnosis not present

## 2021-04-11 DIAGNOSIS — I10 Essential (primary) hypertension: Secondary | ICD-10-CM | POA: Insufficient documentation

## 2021-04-11 DIAGNOSIS — R519 Headache, unspecified: Secondary | ICD-10-CM | POA: Diagnosis present

## 2021-04-11 DIAGNOSIS — R079 Chest pain, unspecified: Secondary | ICD-10-CM | POA: Insufficient documentation

## 2021-04-11 DIAGNOSIS — H538 Other visual disturbances: Secondary | ICD-10-CM | POA: Insufficient documentation

## 2021-04-11 LAB — BASIC METABOLIC PANEL
Anion gap: 9 (ref 5–15)
BUN: 9 mg/dL (ref 6–20)
CO2: 24 mmol/L (ref 22–32)
Calcium: 8.9 mg/dL (ref 8.9–10.3)
Chloride: 104 mmol/L (ref 98–111)
Creatinine, Ser: 0.93 mg/dL (ref 0.44–1.00)
GFR, Estimated: >60 mL/min (ref >60)
Glucose, Bld: 113 mg/dL — ABNORMAL HIGH (ref 70–99)
Potassium: 3.7 mmol/L (ref 3.5–5.1)
Sodium: 137 mmol/L (ref 135–145)

## 2021-04-11 LAB — CBC WITH DIFFERENTIAL/PLATELET
Abs Immature Granulocytes: 0.03 10*3/uL (ref 0.00–0.07)
Basophils Absolute: 0 10*3/uL (ref 0.0–0.1)
Basophils Relative: 0 %
Eosinophils Absolute: 0.1 10*3/uL (ref 0.0–0.5)
Eosinophils Relative: 1 %
HCT: 42.9 % (ref 36.0–46.0)
Hemoglobin: 14.4 g/dL (ref 12.0–15.0)
Immature Granulocytes: 0 %
Lymphocytes Relative: 19 %
Lymphs Abs: 2 10*3/uL (ref 0.7–4.0)
MCH: 32.7 pg (ref 26.0–34.0)
MCHC: 33.6 g/dL (ref 30.0–36.0)
MCV: 97.3 fL (ref 80.0–100.0)
Monocytes Absolute: 0.7 10*3/uL (ref 0.1–1.0)
Monocytes Relative: 7 %
Neutro Abs: 7.5 10*3/uL (ref 1.7–7.7)
Neutrophils Relative %: 73 %
Platelets: 268 10*3/uL (ref 150–400)
RBC: 4.41 MIL/uL (ref 3.87–5.11)
RDW: 13.1 % (ref 11.5–15.5)
WBC: 10.3 10*3/uL (ref 4.0–10.5)
nRBC: 0 % (ref 0.0–0.2)

## 2021-04-11 LAB — HCG, SERUM, QUALITATIVE: Preg, Serum: NEGATIVE

## 2021-04-11 LAB — TROPONIN I (HIGH SENSITIVITY): Troponin I (High Sensitivity): 6 ng/L (ref <18)

## 2021-04-11 MED ORDER — DIPHENHYDRAMINE HCL 50 MG/ML IJ SOLN
12.5000 mg | Freq: Once | INTRAMUSCULAR | Status: AC
  Administered 2021-04-11: 12.5 mg via INTRAVENOUS
  Filled 2021-04-11: qty 1

## 2021-04-11 MED ORDER — METOCLOPRAMIDE HCL 5 MG/ML IJ SOLN
10.0000 mg | Freq: Once | INTRAMUSCULAR | Status: AC
  Administered 2021-04-11: 10 mg via INTRAVENOUS
  Filled 2021-04-11: qty 2

## 2021-04-11 MED ORDER — KETOROLAC TROMETHAMINE 15 MG/ML IJ SOLN
15.0000 mg | Freq: Once | INTRAMUSCULAR | Status: AC
  Administered 2021-04-11: 15 mg via INTRAVENOUS
  Filled 2021-04-11: qty 1

## 2021-04-11 NOTE — Discharge Instructions (Addendum)
You were seen in the ER today for your headache and high blood pressure.  Your physical exam, vital signs, blood work, and CT scan were very reassuring.  There is no sign of damage to your organs within your brain and your high blood pressure.  Suspect that you are experiencing a migraine which can in turn increase your blood pressure as your blood pressure responds to pain.  Please continue to treat your migraine is usually what at home, and follow-up with your primary care doctor as scheduled for next week. Return to the emergency department if you develop any sudden severe worsening of your headache, blurry vision or double vision, nausea or vomiting does not stop, chest pain, shortness of breath, or any other new severe symptoms.

## 2021-04-11 NOTE — ED Provider Notes (Signed)
[facility] EMERGENCY DEPARTMENT Provider Note   CSN: [id number] Arrival date & time: 04/11/21  1557     History Chief Complaint  Patient presents with   Headache    Kim Macdonald is a 50 y.o. female who presents with concern for headache and hypertension noted at home on her home blood pressure monitor.  Patient was seen for same presentation yesterday, but states she returns today because she had brief episode of left-sided chest pain this morning as well as some blurry vision this afternoon.  States she has a follow-up with her PCP scheduled for Monday 04/17/21.  Denies any chest pain or trouble breathing at this time.  Denies any change in her urinary habits.  Denies any fevers or chills.  States she has history of migraines status post to receive monthly injections but she has not followed up with her neurologist in quite some time and no longer takes those injections.  States that this feels less severe than her migraines, but she is adamant that the headaches she has been experiencing this week must be related to her blood pressure.  States she is compliant with her antihypertensive medications.  I have personally reviewed this patient's medical records.  She has history of migraines with aura, GERD, hypertension.  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     Family History  Problem Relation Age of Onset   Congestive Heart Failure Mother 4263       Deceased   Stroke Mother    Diabetes Mother    Stroke Brother        multiple    Hypertension Brother        x3   Hypertension Sister        x4   Migraines Sister    Diabetes Sister    Diabetes Brother    Diabetes Maternal Grandmother    Breast cancer Maternal Aunt    Heart attack Maternal Uncle    Healthy Son        x1   Healthy Daughter        x2    Social History   Tobacco Use   Smoking status: Never   Smokeless tobacco: Never  Vaping Use   Vaping Use: Never used  Substance Use Topics   Alcohol use: Yes    Alcohol/week: 0.0 standard drinks    Comment: rare  Drug use: No    Home Medications Prior to Admission medications   Medication Sig Start Date End Date Taking? Authorizing Provider  amLODipine (NORVASC) 5 MG tablet Take 1 tablet by mouth daily. 11/15/17  Yes [provider]  rizatriptan (MAXALT-MLT) 10 MG disintegrating tablet Take 1 tablet (10 mg total) by mouth as needed for migraine. May repeat in 2 hours if needed. Max twice in one day. 02/11/18  Yes [name], MD  vitamin B-12 (CYANOCOBALAMIN) 500 MCG tablet Take 500 mcg by mouth daily.   Yes [provider]  famotidine (PEPCID) 20 MG tablet Take 20 mg by mouth 2 (two) times daily.    [provider]  fexofenadine (ALLEGRA) 60 MG tablet Take 60 mg by mouth 2 (two) times daily.    [provider]  Fremanezumab-vfrm (AJOVY) 225 MG/1.5ML SOSY Inject 225 mg into the skin every 30 (thirty) days. 01/07/18   [name], MD   Fremanezumab-vfrm (AJOVY) 225 MG/1.5ML SOSY Inject 225 mg into the skin every 30 (thirty) days. 01/07/18   [name], MD  hydrocortisone cream 1 % Apply 1 application topically 2 (two) times daily.    [provider]    Allergies    Levaquin [levofloxacin in d5w] and Vicodin [hydrocodone-acetaminophen]  Review of Systems   Review of Systems  Constitutional: Negative.   HENT: Negative.    Eyes:  Positive for visual disturbance. Negative for photophobia, pain and redness.  Respiratory: Negative.    Cardiovascular:  Positive for chest pain. Negative for palpitations and leg swelling.  Gastrointestinal: Negative.   Genitourinary:  Positive for decreased urine volume. Negative for difficulty urinating, dyspareunia, dysuria, enuresis, frequency, genital sores, hematuria, pelvic pain and urgency.  Musculoskeletal: Negative.   Skin: Negative.   Neurological:  Positive for headaches. Negative for dizziness, syncope, facial asymmetry, weakness and light-headedness.   Physical Exam Updated Vital Signs BP (!) 153/94   Pulse 73   Temp 98.3 F (36.8 C) (Oral)   Resp 16   Ht 5\' 7"  (1.702 m)   Wt 113.4 kg   SpO2 99%   BMI 39.16 kg/m   Physical Exam Vitals and nursing note reviewed.  Constitutional:      Appearance: She is obese. She is not ill-appearing or toxic-appearing.  HENT:     Head: Normocephalic and atraumatic.     Nose: Nose normal.     Mouth/Throat:     Mouth: Mucous membranes are moist.     Pharynx: Oropharynx is clear. Uvula midline. No oropharyngeal exudate, posterior oropharyngeal erythema or uvula swelling.     Tonsils: No tonsillar exudate.  Eyes:     General: Lids are normal. Vision grossly intact.        Right eye: No discharge.        Left eye: No discharge.     Extraocular Movements: Extraocular movements intact.     Conjunctiva/sclera: Conjunctivae normal.     Pupils: Pupils are equal, round, and reactive to light.  Neck:     Trachea: Trachea  and phonation normal.     Meningeal: Brudzinski's sign and Kernig's sign absent.  Cardiovascular:     Rate and Rhythm: Normal rate and regular rhythm.     Pulses: Normal pulses.     Heart sounds: Normal heart sounds. No murmur heard. Pulmonary:     Effort: Pulmonary effort is normal. No tachypnea, bradypnea, accessory muscle usage, prolonged expiration or respiratory distress.     Breath sounds: Normal breath sounds.  No wheezing or rales.  Chest:     Chest Carstarphen: No mass, lacerations, deformity, swelling, tenderness, crepitus or edema.  Abdominal:     General: Bowel sounds are normal. There is no distension.     Palpations: Abdomen is soft.     Tenderness: There is no abdominal tenderness. There is no right CVA tenderness, left CVA tenderness, guarding or rebound.  Musculoskeletal:        General: No deformity. Normal range of motion.     Cervical back: Full passive range of motion without pain, normal range of motion and neck supple. No rigidity or crepitus. No pain with movement, spinous process tenderness or muscular tenderness.     Right lower leg: No edema.     Left lower leg: No edema.  Lymphadenopathy:     Cervical: No cervical adenopathy.  Skin:    General: Skin is warm and dry.     Capillary Refill: Capillary refill takes less than 2 seconds.     Findings: No rash.  Neurological:     General: No focal deficit present.     Mental Status: She is alert and oriented to person, place, and time. Mental status is at baseline.     GCS: GCS eye subscore is 4. GCS verbal subscore is 5. GCS motor subscore is 6.     Cranial Nerves: Cranial nerves are intact.     Sensory: Sensation is intact.     Motor: Motor function is intact.     Gait: Gait is intact.  Psychiatric:        Mood and Affect: Mood normal.        Behavior: Behavior normal.    ED Results / Procedures / Treatments   Labs (all labs ordered are listed, but only abnormal results are displayed) Labs Reviewed  BASIC  METABOLIC PANEL - Abnormal; Notable for the following components:      Result Value   Glucose, Bld 113 (*)    All other components within normal limits  CBC WITH DIFFERENTIAL/PLATELET  HCG, SERUM, QUALITATIVE  URINALYSIS, ROUTINE W REFLEX MICROSCOPIC  TROPONIN I (HIGH SENSITIVITY)    EKG EKG NSR, no STEMI.  Radiology CT Head Wo Contrast  Result Date: 04/11/2021 CLINICAL DATA:  50 year old female with headache. Concern for intracranial hemorrhage. EXAM: CT HEAD WITHOUT CONTRAST TECHNIQUE: Contiguous axial images were obtained from the base of the skull through the vertex without intravenous contrast. COMPARISON:  Head CT dated 11/28/2014. FINDINGS: Brain: The ventricles and sulci appropriate size for patient's age. The gray-white matter discrimination is preserved. There is no acute intracranial hemorrhage. No mass effect or midline shift. No extra-axial fluid collection. Vascular: No hyperdense vessel or unexpected calcification. Skull: Normal. Negative for fracture or focal lesion. Sinuses/Orbits: No acute finding. Other: None IMPRESSION: Unremarkable noncontrast CT of the brain. Electronically Signed   By: Elgie Collard M.D.   On: 04/11/2021 19:36    Procedures Procedures   Medications Ordered in ED Medications  metoCLOPramide (REGLAN) injection 10 mg (10 mg Intravenous Given 04/11/21 1847)  ketorolac (TORADOL) 15 MG/ML injection 15 mg (15 mg Intravenous Given 04/11/21 1849)  diphenhydrAMINE (BENADRYL) injection 12.5 mg (12.5 mg Intravenous Given 04/11/21 1850)    ED Course  I have reviewed the triage vital signs and the nursing notes.  Pertinent labs & imaging results that were available during my care of the patient were reviewed by me and considered in my medical decision making (see chart for details).    MDM Rules/Calculators/A&P  50 year old female who was seen yesterday for headache and hypertension returns with concern for the same now with some  blurry vision and transient episode of left-sided chest pain.  Differential diagnosis includes is not mentioned to hypertensive headache, hypertensive urgency/emergency, tension type headache, migraine headache.  Hypertensive on intake, vital signs otherwise normal.  Cardiopulmonary exam is normal, abdominal exam is benign.  Neurologic exam is without focal deficit.  Patient is neurovascularly intact in all 4 extremities.  She is chest pain-free at this time.  Given concerns for chest pain and blurry vision we will proceed with  basic labs and CT of the head at this time.  CBC unremarkable, BMP unremarkable.  Troponin negative, 6.  CT head negative for acute intracranial abnormality.  EKG NSR, no STEMI.  Patient reevaluated after administration of headache cocktail with improvement in her headache.  Given reassuring physical exam and laboratory findings, no further work-up is warranted in ED at this time.  Feel patient's headaches are likely unrelated to her hypertension given history of chronic migraines and intermittent nature of her hypertension.  Encouraged that she continues to follow-up with her PCP as scheduled for early next week.  No further work-up warranted in ED at this time.  Kim Macdonald voiced understanding of her medical evaluation and treatment plan.  Each of her questions answered to her expressed satisfaction.  Return precautions given.  Patient is well-appearing, stable, improved for discharge this time.  This chart was dictated using voice recognition software, Dragon. Despite the best efforts of this provider to proofread and correct errors, errors may still occur which can change documentation meaning.  Final Clinical Impression(s) / ED Diagnoses Final diagnoses:  Bad headache    Rx / DC Orders ED Discharge Orders     None        Sherrilee Gilles 04/11/21 2056    Terald Sleeper, MD 04/12/21 1013

## 2021-04-11 NOTE — ED Triage Notes (Signed)
Pt reports seen for same yesterday. Pt reports continued hypertension and headache.

## 2023-06-26 ENCOUNTER — Encounter (HOSPITAL_BASED_OUTPATIENT_CLINIC_OR_DEPARTMENT_OTHER): Payer: Self-pay

## 2023-06-26 ENCOUNTER — Emergency Department (HOSPITAL_BASED_OUTPATIENT_CLINIC_OR_DEPARTMENT_OTHER): Payer: BLUE CROSS/BLUE SHIELD

## 2023-06-26 ENCOUNTER — Emergency Department (HOSPITAL_BASED_OUTPATIENT_CLINIC_OR_DEPARTMENT_OTHER)
Admission: EM | Admit: 2023-06-26 | Discharge: 2023-06-26 | Disposition: A | Discharge status: Home / Self Care | Payer: BLUE CROSS/BLUE SHIELD | Attending: Emergency Medicine | Admitting: Emergency Medicine

## 2023-06-26 ENCOUNTER — Other Ambulatory Visit: Payer: Self-pay

## 2023-06-26 DIAGNOSIS — Z20822 Contact with and (suspected) exposure to covid-19: Secondary | ICD-10-CM | POA: Diagnosis not present

## 2023-06-26 DIAGNOSIS — R0602 Shortness of breath: Secondary | ICD-10-CM | POA: Diagnosis not present

## 2023-06-26 DIAGNOSIS — Z79899 Other long term (current) drug therapy: Secondary | ICD-10-CM | POA: Diagnosis not present

## 2023-06-26 DIAGNOSIS — R109 Unspecified abdominal pain: Secondary | ICD-10-CM | POA: Insufficient documentation

## 2023-06-26 DIAGNOSIS — R202 Paresthesia of skin: Secondary | ICD-10-CM | POA: Insufficient documentation

## 2023-06-26 DIAGNOSIS — I10 Essential (primary) hypertension: Secondary | ICD-10-CM | POA: Diagnosis not present

## 2023-06-26 DIAGNOSIS — M549 Dorsalgia, unspecified: Secondary | ICD-10-CM | POA: Diagnosis not present

## 2023-06-26 DIAGNOSIS — R059 Cough, unspecified: Secondary | ICD-10-CM | POA: Insufficient documentation

## 2023-06-26 DIAGNOSIS — R0600 Dyspnea, unspecified: Secondary | ICD-10-CM | POA: Diagnosis present

## 2023-06-26 LAB — CBC
HCT: 42.3 % (ref 36.0–46.0)
Hemoglobin: 14.1 g/dL (ref 12.0–15.0)
MCH: 32 pg (ref 26.0–34.0)
MCHC: 33.3 g/dL (ref 30.0–36.0)
MCV: 95.9 fL (ref 80.0–100.0)
Platelets: 313 10*3/uL (ref 150–400)
RBC: 4.41 MIL/uL (ref 3.87–5.11)
RDW: 12.6 % (ref 11.5–15.5)
WBC: 12.4 10*3/uL — ABNORMAL HIGH (ref 4.0–10.5)
nRBC: 0 % (ref 0.0–0.2)

## 2023-06-26 LAB — URINALYSIS, ROUTINE W REFLEX MICROSCOPIC
Bilirubin Urine: NEGATIVE
Glucose, UA: NEGATIVE mg/dL
Ketones, ur: NEGATIVE mg/dL
Nitrite: NEGATIVE
Protein, ur: NEGATIVE mg/dL
Specific Gravity, Urine: >=1.03 (ref 1.005–1.030)
pH: 5.5 (ref 5.0–8.0)

## 2023-06-26 LAB — BASIC METABOLIC PANEL
Anion gap: 9 (ref 5–15)
BUN: 11 mg/dL (ref 6–20)
CO2: 27 mmol/L (ref 22–32)
Calcium: 9.1 mg/dL (ref 8.9–10.3)
Chloride: 103 mmol/L (ref 98–111)
Creatinine, Ser: 0.94 mg/dL (ref 0.44–1.00)
GFR, Estimated: >60 mL/min (ref >60)
Glucose, Bld: 114 mg/dL — ABNORMAL HIGH (ref 70–99)
Potassium: 3.5 mmol/L (ref 3.5–5.1)
Sodium: 139 mmol/L (ref 135–145)

## 2023-06-26 LAB — D-DIMER, QUANTITATIVE: D-Dimer, Quant: 0.28 ug/mL-FEU (ref 0.00–0.50)

## 2023-06-26 LAB — SARS CORONAVIRUS 2 BY RT PCR: SARS Coronavirus 2 by RT PCR: NEGATIVE

## 2023-06-26 LAB — CBG MONITORING, ED: Glucose-Capillary: 97 mg/dL (ref 70–99)

## 2023-06-26 LAB — URINALYSIS, MICROSCOPIC (REFLEX)

## 2023-06-26 LAB — MAGNESIUM: Magnesium: 2.1 mg/dL (ref 1.7–2.4)

## 2023-06-26 LAB — PREGNANCY, URINE: Preg Test, Ur: NEGATIVE

## 2023-06-26 MED ORDER — ACETAMINOPHEN 500 MG PO TABS
1000.0000 mg | ORAL_TABLET | Freq: Once | ORAL | Status: AC
  Administered 2023-06-26: 1000 mg via ORAL
  Filled 2023-06-26: qty 2

## 2023-06-26 MED ORDER — HYDROXYZINE HCL 25 MG PO TABS: 25.0000 mg | ORAL_TABLET | Freq: Four times a day (QID) | ORAL | 0 refills | Status: AC

## 2023-06-26 MED ORDER — HYDROXYZINE HCL 25 MG PO TABS: 25.0000 mg | ORAL_TABLET | Freq: Four times a day (QID) | ORAL | 0 refills | Status: DC

## 2023-06-26 NOTE — ED Triage Notes (Signed)
Pt reports that she has had some tingling arms, leg and her whole body. Pt states that she is also having back pain. SOB at rest and while walking.

## 2023-06-26 NOTE — ED Notes (Signed)
Patient aware she needs to provide a UA

## 2023-06-26 NOTE — ED Provider Notes (Signed)
[facility] Provider Note   CSN: [id number] Arrival date & time: 06/26/23  1656     History  Chief Complaint  Patient presents with   Weakness    Kim Macdonald is a 52 y.o. female with PMH as listed below who presents with SOB all the time which is her main complaint. Going on for 2 weeks. Has been to ED several times with no answers. Not worse with exertion. Denies chest pain. Endorses some mild cough and throat clearing, nonproductive. Denies leg swelling, but does endorse that the SOB is worse when she lies down. No recent car trips/road trips, no h/o DVT/PE, no recent hospitalizations/surgeries, does not take oral birth control. Patient also reports a tingly feeling all over, sometimes on left side, sometimes feels really warm as well all over.  Also reports sometimes abdominal pains and back pains but those are intermittent. She reports mold in her building. Denies urinary sxs, vaginal sxs, trauma/falls.   Past Medical History:  Diagnosis Date   Breast discharge    bil unknown  time    GERD (gastroesophageal reflux disease)    HA (headache)    History of chicken pox    Hypertension    Migraine        Home Medications Prior to Admission medications   Medication Sig Start Date End Date Taking? Authorizing Provider  amLODipine (NORVASC) 5 MG tablet Take 1 tablet by mouth daily. 11/15/17   [provider]  famotidine (PEPCID) 20 MG tablet Take 20 mg by mouth 2 (two) times daily.    [provider]  fexofenadine (ALLEGRA) 60 MG tablet Take 60 mg by mouth 2 (two) times daily.    [provider]  Fremanezumab-vfrm (AJOVY) 225 MG/1.5ML SOSY Inject 225 mg into the skin every 30 (thirty) days. 01/07/18   [name], MD  Fremanezumab-vfrm (AJOVY) 225 MG/1.5ML SOSY Inject 225 mg into the skin every 30 (thirty) days. 01/07/18   [name], MD  hydrocortisone cream 1 % Apply 1 application topically 2 (two)  times daily.    [provider]  hydrOXYzine (ATARAX) 25 MG tablet Take 1 tablet (25 mg total) by mouth every 6 (six) hours. 06/26/23   Loetta Rough, MD  rizatriptan (MAXALT-MLT) 10 MG disintegrating tablet Take 1 tablet (10 mg total) by mouth as needed for migraine. May repeat in 2 hours if needed. Max twice in one day. 02/11/18   [name], MD  vitamin B-12 (CYANOCOBALAMIN) 500 MCG tablet Take 500 mcg by mouth daily.    [provider]      Allergies    Levaquin [levofloxacin in d5w] and Vicodin [hydrocodone-acetaminophen]    Review of Systems   Review of Systems A 10 point review of systems was performed and is negative unless otherwise reported in HPI.  Physical Exam Updated Vital Signs BP (!) 159/95 (BP Location: Left Arm)   Pulse 70   Temp 98 F (36.7 C) (Oral)   Resp 16   Ht 5\' 7"  (1.702 m)   Wt 112.9 kg   SpO2 99%   BMI 39.00 kg/m  Physical Exam General: Normal appearing female, lying in bed.  HEENT: EOMI, no nystagmus, Sclera anicteric, MMM, trachea midline.  Cardiology: RRR, no murmurs/rubs/gallops. BL radial and DP pulses equal bilaterally.  Resp: Normal respiratory rate and effort. CTAB, no wheezes, rhonchi, crackles.  Abd: Soft, non-tender, non-distended. No rebound tenderness or guarding.  GU: Deferred. MSK: No peripheral  edema or signs of trauma.  Skin: warm, dry.  Back: No CVA tenderness Neuro: A&Ox4, CNs II-XII grossly intact. 5/5 strength all extremities. Sensation grossly intact. Normal speech. Psych: Normal mood and affect.   ED Results / Procedures / Treatments   Labs (all labs ordered are listed, but only abnormal results are displayed) Labs Reviewed  BASIC METABOLIC PANEL - Abnormal; Notable for the following components:      Result Value   Glucose, Bld 114 (*)    All other components within normal limits  CBC - Abnormal; Notable for the following components:   WBC 12.4 (*)    All other components within normal limits   URINALYSIS, ROUTINE W REFLEX MICROSCOPIC - Abnormal; Notable for the following components:   APPearance HAZY (*)    Hgb urine dipstick MODERATE (*)    Leukocytes,Ua TRACE (*)    All other components within normal limits  URINALYSIS, MICROSCOPIC (REFLEX) - Abnormal; Notable for the following components:   Bacteria, UA RARE (*)    All other components within normal limits  SARS CORONAVIRUS 2 BY RT PCR  PREGNANCY, URINE  MAGNESIUM  D-DIMER, QUANTITATIVE  CBG MONITORING, ED    EKG EKG Interpretation Date/Time:  Wednesday June 26 2023 17:18:10 EDT Ventricular Rate:  72 PR Interval:  144 QRS Duration:  79 QT Interval:  383 QTC Calculation: 420 R Axis:   44  Text Interpretation: Sinus rhythm Borderline T abnormalities, diffuse leads No significant change was found Confirmed by Vivi Barrack [phone]) on 06/26/2023 7:17:48 PM  Radiology DG Chest 2 View  Result Date: 06/26/2023 CLINICAL DATA:  Shortness of breath. EXAM: CHEST - 2 VIEW COMPARISON:  06/21/2023. FINDINGS: Bilateral lung fields are clear. Bilateral costophrenic angles are clear. Normal cardio-mediastinal silhouette. No acute osseous abnormalities. Lower cervical spinal fixation hardware is seen. The soft tissues are within normal limits. IMPRESSION: No acute cardiopulmonary process. Electronically Signed   By: Jules Schick M.D.   On: 06/26/2023 20:21    Procedures Procedures    Medications Ordered in ED Medications  acetaminophen (TYLENOL) tablet 1,000 mg (1,000 mg Oral Given 06/26/23 2248)    ED Course/ Medical Decision Making/ A&P                          Medical Decision Making Amount and/or Complexity of Data Reviewed Labs: ordered. Decision-making details documented in ED Course. Radiology: ordered.  Risk OTC drugs. Prescription drug management.    This patient presents to the ED for concern of dyspnea and multiple complaints, this involves an extensive number of treatment options, and is a  complaint that carries with it a high risk of complications and morbidity.  I considered the following differential and admission for this acute, potentially life threatening condition.   MDM:    DDX for generalized weakness includes but is not limited to:  Consider infectious processes but no PNA or cough to suggest bronchitis, negative UA; severe metabolic derangements or electrolyte abnormalities but these are normal. Considered heart failure, anemia, and intracranial/central processes but think these are unlikely given the history and physical exam.   Patient also reports subacute dyspnea that is ongoing, she has been seen in the ED multiple times with workups that have been neg for PE, ACS. She denies wheezing to suggest asthma/COPD and has no history of this. She denies LEE but endorses orthopnea, has had negative BNPs recently had CXR doesn't show any pulmonary edema or cardiomegaly. She has negative troponins  and recent troponins have all been negative as well, no ischemic signs on EKG, very low c/f ACS. She has ruled out for PE multiple times and has few risk factors, and her dimer is negative today as well. She has no PNA, PTX, pulm edema, pleural effusion noted on CXR. No murmur on auscultation to suggest valvular heart disease or aortic stenosis/regurg. Consider other more chronic indolent causes of dyspnea such as pulmonary hypertension or ILD. Patient also reports significant anxiety about her symptoms and reports that she believes that she feels dyspneic, becomes anxious, and her dyspnea gets worst. She does not take anything for anxiety and hyperventilation can explain her tingly all over sensation. She does not appear acutely anxious and is not hyperventilating now on exam. I offered hydroxyzine PRN at home to see if this helps her anxiety or any of her dyspneic symptoms and she would like to try it.   She is overall extremely well appearing and vitally stable albeit mildly hypertensive,  do not believe patient requires admission today and believe she is safe for discharge.    Clinical Course as of 06/28/23 1430  Wed Jun 26, 2023  0981 Basic metabolic panel(!) unremarkable [HN]  2120 D-Dimer, Quant: 0.28 neg [HN]  2218 Patient's workup only significant for mild leukocytosis which she has had in the past as well.  Chest x-ray is very reassuring without any abnormality and dimer is negative, very low concern for PE.  She has follow-up appointment next Friday with a primary care physician and I encouraged her to keep that appointment for continued workup.  I also suggested she could consider specialist such as cardiology or pulmonology for further workup and investigation.  Patient is hemodynamically stable, in no respiratory distress, satting well on room air, and I believe she is stable for discharge.  Given discharge instructions and return precautions, all questions answered to patient satisfaction. [HN]    Clinical Course User Index [HN] Loetta Rough, MD    Labs: I Ordered, and personally interpreted labs.  The pertinent results include:  those listed above  Imaging Studies ordered: I ordered imaging studies including CXR I independently visualized and interpreted imaging. I agree with the radiologist interpretation  Additional history obtained from chart review.    Cardiac Monitoring: The patient was maintained on a cardiac monitor.  I personally viewed and interpreted the cardiac monitored which showed an underlying rhythm of: NSR  Reevaluation: After the interventions noted above, I reevaluated the patient and found that they have :stayed the same  Social Determinants of Health: Lives independently  Disposition:  DC w/ discharge instructions/return precautions. All questions answered to patient's satisfaction.    Co morbidities that complicate the patient evaluation  Past Medical History:  Diagnosis Date   Breast discharge    bil unknown  time    GERD  (gastroesophageal reflux disease)    HA (headache)    History of chicken pox    Hypertension    Migraine      Medicines Meds ordered this encounter  Medications   DISCONTD: hydrOXYzine (ATARAX) 25 MG tablet    Sig: Take 1 tablet (25 mg total) by mouth every 6 (six) hours.    Dispense:  12 tablet    Refill:  0   acetaminophen (TYLENOL) tablet 1,000 mg   hydrOXYzine (ATARAX) 25 MG tablet    Sig: Take 1 tablet (25 mg total) by mouth every 6 (six) hours.    Dispense:  12 tablet  Refill:  0    I have reviewed the patients home medicines and have made adjustments as needed  Problem List / ED Course: Problem List Items Addressed This Visit   None Visit Diagnoses     Dyspnea, unspecified type    -  Primary                   This note was created using dictation software, which may contain spelling or grammatical errors.    Loetta Rough, MD 06/28/23 [phone]

## 2023-06-26 NOTE — Discharge Instructions (Addendum)
Thank you for coming to [facility] Emergency Department. You were seen for shortness of breath. We did an exam, labs, and imaging, and these showed no acute findings.  Please follow up with your primary care provider within 1 week for further workup and investigation.   We have prescribed you hydroxyzine 25 mg which you can take up to 3 times per day for anxiety.  Do not hesitate to return to the ED or call 911 if you experience: -Worsening symptoms -Chest pain -Lightheadedness, passing out -Fevers/chills -Anything else that concerns you
